# Patient Record
Sex: Female | Born: 1975 | Race: Black or African American | Hispanic: No | Marital: Single | Smoking: Former smoker
Health system: Southern US, Community
[De-identification: ages and names within clinical notes are randomized; demographics above are authoritative.]

## PROBLEM LIST (undated history)

## (undated) DIAGNOSIS — C801 Malignant (primary) neoplasm, unspecified: Secondary | ICD-10-CM

## (undated) DIAGNOSIS — E079 Disorder of thyroid, unspecified: Secondary | ICD-10-CM

## (undated) DIAGNOSIS — I10 Essential (primary) hypertension: Secondary | ICD-10-CM

## (undated) DIAGNOSIS — Q600 Renal agenesis, unilateral: Secondary | ICD-10-CM

---

## 1998-03-03 ENCOUNTER — Emergency Department (HOSPITAL_COMMUNITY): Admission: EM | Admit: 1998-03-03 | Discharge: 1998-03-03 | Payer: Self-pay | Admitting: Emergency Medicine

## 1998-03-30 ENCOUNTER — Emergency Department (HOSPITAL_COMMUNITY): Admission: EM | Admit: 1998-03-30 | Discharge: 1998-03-30 | Payer: Self-pay | Admitting: Emergency Medicine

## 1999-10-26 ENCOUNTER — Encounter: Payer: Self-pay | Admitting: Emergency Medicine

## 1999-10-26 ENCOUNTER — Emergency Department (HOSPITAL_COMMUNITY): Admission: EM | Admit: 1999-10-26 | Discharge: 1999-10-26 | Payer: Self-pay | Admitting: Emergency Medicine

## 2000-02-08 ENCOUNTER — Encounter: Admission: RE | Admit: 2000-02-08 | Discharge: 2000-02-08 | Payer: Self-pay | Admitting: Internal Medicine

## 2000-02-08 ENCOUNTER — Encounter: Payer: Self-pay | Admitting: Internal Medicine

## 2000-02-22 ENCOUNTER — Encounter: Payer: Self-pay | Admitting: Urology

## 2000-02-22 ENCOUNTER — Encounter: Admission: RE | Admit: 2000-02-22 | Discharge: 2000-02-22 | Payer: Self-pay | Admitting: Urology

## 2000-09-08 ENCOUNTER — Other Ambulatory Visit: Admission: RE | Admit: 2000-09-08 | Discharge: 2000-09-08 | Payer: Self-pay | Admitting: Gynecology

## 2000-09-11 ENCOUNTER — Emergency Department (HOSPITAL_COMMUNITY): Admission: EM | Admit: 2000-09-11 | Discharge: 2000-09-11 | Payer: Self-pay

## 2000-09-11 ENCOUNTER — Encounter: Payer: Self-pay | Admitting: Emergency Medicine

## 2000-10-06 ENCOUNTER — Encounter: Payer: Self-pay | Admitting: Urology

## 2000-10-06 ENCOUNTER — Encounter: Admission: RE | Admit: 2000-10-06 | Discharge: 2000-10-06 | Payer: Self-pay | Admitting: Urology

## 2000-11-07 ENCOUNTER — Ambulatory Visit (HOSPITAL_COMMUNITY): Admission: RE | Admit: 2000-11-07 | Discharge: 2000-11-07 | Payer: Self-pay | Admitting: Urology

## 2000-11-07 ENCOUNTER — Encounter: Payer: Self-pay | Admitting: Urology

## 2001-05-17 ENCOUNTER — Other Ambulatory Visit: Admission: RE | Admit: 2001-05-17 | Discharge: 2001-05-17 | Payer: Self-pay | Admitting: Gynecology

## 2002-03-14 ENCOUNTER — Encounter: Admission: RE | Admit: 2002-03-14 | Discharge: 2002-03-14 | Payer: Self-pay | Admitting: Urology

## 2002-03-14 ENCOUNTER — Encounter: Payer: Self-pay | Admitting: Urology

## 2002-05-23 ENCOUNTER — Other Ambulatory Visit: Admission: RE | Admit: 2002-05-23 | Discharge: 2002-05-23 | Payer: Self-pay | Admitting: Gynecology

## 2002-11-26 ENCOUNTER — Other Ambulatory Visit: Admission: RE | Admit: 2002-11-26 | Discharge: 2002-11-26 | Payer: Self-pay | Admitting: *Deleted

## 2003-03-01 ENCOUNTER — Ambulatory Visit (HOSPITAL_COMMUNITY): Admission: RE | Admit: 2003-03-01 | Discharge: 2003-03-01 | Payer: Self-pay | Admitting: *Deleted

## 2003-04-02 ENCOUNTER — Encounter: Admission: RE | Admit: 2003-04-02 | Discharge: 2003-04-02 | Payer: Self-pay | Admitting: Obstetrics and Gynecology

## 2003-04-06 ENCOUNTER — Inpatient Hospital Stay (HOSPITAL_COMMUNITY): Admission: AD | Admit: 2003-04-06 | Discharge: 2003-04-06 | Payer: Self-pay | Admitting: Obstetrics and Gynecology

## 2003-05-23 ENCOUNTER — Inpatient Hospital Stay (HOSPITAL_COMMUNITY): Admission: RE | Admit: 2003-05-23 | Discharge: 2003-05-26 | Payer: Self-pay | Admitting: Obstetrics and Gynecology

## 2003-07-03 ENCOUNTER — Other Ambulatory Visit: Admission: RE | Admit: 2003-07-03 | Discharge: 2003-07-03 | Payer: Self-pay | Admitting: *Deleted

## 2003-10-11 ENCOUNTER — Encounter: Admission: RE | Admit: 2003-10-11 | Discharge: 2003-10-11 | Payer: Self-pay | Admitting: Internal Medicine

## 2004-05-24 ENCOUNTER — Emergency Department (HOSPITAL_COMMUNITY): Admission: EM | Admit: 2004-05-24 | Discharge: 2004-05-24 | Payer: Self-pay | Admitting: Family Medicine

## 2005-11-26 ENCOUNTER — Other Ambulatory Visit: Admission: RE | Admit: 2005-11-26 | Discharge: 2005-11-26 | Payer: Self-pay | Admitting: Gynecology

## 2006-01-12 ENCOUNTER — Encounter: Admission: RE | Admit: 2006-01-12 | Discharge: 2006-01-12 | Payer: Self-pay | Admitting: Internal Medicine

## 2006-01-20 ENCOUNTER — Encounter: Admission: RE | Admit: 2006-01-20 | Discharge: 2006-01-20 | Payer: Self-pay | Admitting: Internal Medicine

## 2006-07-05 ENCOUNTER — Emergency Department (HOSPITAL_COMMUNITY): Admission: EM | Admit: 2006-07-05 | Discharge: 2006-07-06 | Payer: Self-pay | Admitting: Emergency Medicine

## 2006-10-28 ENCOUNTER — Ambulatory Visit (HOSPITAL_COMMUNITY): Admission: RE | Admit: 2006-10-28 | Discharge: 2006-10-28 | Payer: Self-pay | Admitting: Internal Medicine

## 2006-11-16 ENCOUNTER — Other Ambulatory Visit: Admission: RE | Admit: 2006-11-16 | Discharge: 2006-11-16 | Payer: Self-pay | Admitting: Gynecology

## 2006-11-29 ENCOUNTER — Ambulatory Visit: Admission: RE | Admit: 2006-11-29 | Discharge: 2006-11-29 | Payer: Self-pay | Admitting: Gynecologic Oncology

## 2006-12-28 ENCOUNTER — Encounter (HOSPITAL_COMMUNITY): Admission: RE | Admit: 2006-12-28 | Discharge: 2006-12-29 | Payer: Self-pay | Admitting: Internal Medicine

## 2007-01-06 ENCOUNTER — Ambulatory Visit (HOSPITAL_COMMUNITY): Admission: RE | Admit: 2007-01-06 | Discharge: 2007-01-06 | Payer: Self-pay | Admitting: Internal Medicine

## 2007-01-23 ENCOUNTER — Ambulatory Visit (HOSPITAL_COMMUNITY): Admission: RE | Admit: 2007-01-23 | Discharge: 2007-01-23 | Payer: Self-pay | Admitting: Gynecologic Oncology

## 2007-01-24 ENCOUNTER — Ambulatory Visit: Admission: RE | Admit: 2007-01-24 | Discharge: 2007-01-24 | Payer: Self-pay | Admitting: Gynecologic Oncology

## 2007-02-10 ENCOUNTER — Emergency Department (HOSPITAL_COMMUNITY): Admission: EM | Admit: 2007-02-10 | Discharge: 2007-02-10 | Payer: Self-pay | Admitting: Family Medicine

## 2007-03-17 ENCOUNTER — Emergency Department (HOSPITAL_COMMUNITY): Admission: EM | Admit: 2007-03-17 | Discharge: 2007-03-17 | Payer: Self-pay | Admitting: Family Medicine

## 2007-10-19 ENCOUNTER — Emergency Department (HOSPITAL_COMMUNITY): Admission: EM | Admit: 2007-10-19 | Discharge: 2007-10-19 | Payer: Self-pay | Admitting: Family Medicine

## 2010-08-19 ENCOUNTER — Emergency Department (HOSPITAL_COMMUNITY)
Admission: EM | Admit: 2010-08-19 | Discharge: 2010-08-20 | Payer: Self-pay | Source: Home / Self Care | Admitting: Emergency Medicine

## 2010-08-24 LAB — URINALYSIS, ROUTINE W REFLEX MICROSCOPIC
Bilirubin Urine: NEGATIVE
Ketones, ur: NEGATIVE mg/dL
Nitrite: POSITIVE — AB
Protein, ur: NEGATIVE mg/dL
Specific Gravity, Urine: 1.015 (ref 1.005–1.030)
Urine Glucose, Fasting: NEGATIVE mg/dL
Urobilinogen, UA: 1 mg/dL (ref 0.0–1.0)
pH: 6 (ref 5.0–8.0)

## 2010-08-24 LAB — POCT I-STAT, CHEM 8
BUN: 6 mg/dL (ref 6–23)
Calcium, Ion: 1.08 mmol/L — ABNORMAL LOW (ref 1.12–1.32)
Chloride: 103 mEq/L (ref 96–112)
Creatinine, Ser: 1.1 mg/dL (ref 0.4–1.2)
Glucose, Bld: 167 mg/dL — ABNORMAL HIGH (ref 70–99)
HCT: 47 % — ABNORMAL HIGH (ref 36.0–46.0)
Hemoglobin: 16 g/dL — ABNORMAL HIGH (ref 12.0–15.0)
Potassium: 3.6 mEq/L (ref 3.5–5.1)
Sodium: 139 mEq/L (ref 135–145)
TCO2: 27 mmol/L (ref 0–100)

## 2010-08-24 LAB — CBC
HCT: 44.6 % (ref 36.0–46.0)
Hemoglobin: 14.9 g/dL (ref 12.0–15.0)
MCH: 26.6 pg (ref 26.0–34.0)
MCHC: 33.4 g/dL (ref 30.0–36.0)
MCV: 79.6 fL (ref 78.0–100.0)
Platelets: 206 10*3/uL (ref 150–400)
RBC: 5.6 MIL/uL — ABNORMAL HIGH (ref 3.87–5.11)
RDW: 14.8 % (ref 11.5–15.5)
WBC: 9.6 10*3/uL (ref 4.0–10.5)

## 2010-08-24 LAB — URINE MICROSCOPIC-ADD ON

## 2010-08-24 LAB — DIFFERENTIAL
Basophils Absolute: 0 10*3/uL (ref 0.0–0.1)
Basophils Relative: 0 % (ref 0–1)
Eosinophils Absolute: 0.4 10*3/uL (ref 0.0–0.7)
Eosinophils Relative: 4 % (ref 0–5)
Lymphocytes Relative: 12 % (ref 12–46)
Lymphs Abs: 1.1 10*3/uL (ref 0.7–4.0)
Monocytes Absolute: 0.5 10*3/uL (ref 0.1–1.0)
Monocytes Relative: 5 % (ref 3–12)
Neutro Abs: 7.5 10*3/uL (ref 1.7–7.7)
Neutrophils Relative %: 79 % — ABNORMAL HIGH (ref 43–77)

## 2010-08-24 LAB — PREGNANCY, URINE: Preg Test, Ur: NEGATIVE

## 2010-08-29 ENCOUNTER — Encounter: Payer: Self-pay | Admitting: Internal Medicine

## 2010-08-30 ENCOUNTER — Encounter: Payer: Self-pay | Admitting: Internal Medicine

## 2010-08-30 ENCOUNTER — Encounter: Payer: Self-pay | Admitting: Gynecologic Oncology

## 2010-12-22 NOTE — Consult Note (Signed)
Patricia Mcclain, Patricia Mcclain                ACCOUNT NO.:  [id number]   MEDICAL RECORD NO.:  [id number]          PATIENT TYPE:  OUT   LOCATION:  GYN                          FACILITY:  [facility]   PHYSICIAN:  Paola A. Duard Brady, MD    DATE OF BIRTH:  04/26/1976   DATE OF CONSULTATION:  DATE OF DISCHARGE:                                 CONSULTATION   Patricia Mcclain is a 35 year old who I initially saw November 29, 2006.  At that  time she was referred to me by Dr. Nicholas Lose for a complex 5.1 x 3 cm  ovarian mass with a hydrosalpinx that quite possibly measured 5.5 x 2.4  cm.  The left ovary was not seen and your impression of the right ovary  was that this was a tubo-ovarian complex mass versus an endometrioma.  After a lengthy discussion she opted for interval ultrasound.  The mass  had gotten somewhat bigger compared to prior ultrasounds and CT reports,  but not significantly.  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 Kathrynn Running.  She comes in today for follow  up.  She is overall doing quite well.  She states that she is not  feeling any different with regards to her shaking, her night sweats or  her mood swings.  She denies any change in her bowel or bladder habits.  Her last cycle was June 1 and it is too early to tell whether or not her  cycles are going to get regular from her irradiated iodine.  She has  follow up with Dr. Chilton Si pending.   PAST MEDICAL HISTORY:  Graves disease.   ALLERGIES:  CODEINE.   PHYSICAL EXAMINATION:  Weight 246 pounds.  Abdomen:  Soft, nontender,  nondistended, no palpable mass or hepatosplenomegaly.  Pelvic bimanual  examination:  Cervix is palpably normal.  The corpus is normal size,  shape __________. There are no adnexal masses palpable.   ASSESSMENT:  A 35 year old with a complex right ovarian mass that was  palpable on my exam in April and was also noted to be  approximately 5 cm  on ultrasound.  She had an ultrasound on June 16 that reveals the uterus  to be 7.1 x 3.6 x 4.7 cm with a normal homogeneous endometrium.  The  right ovary contained two cysts, one measuring 2.8 x 2.6 x 2.9 cm and  the other measured 2 x 1.6 cm with a small amount of free fluid around  the right ovary.  These cysts were noted on prior CT scan and they  appear smaller than on her prior scan.  These results were discussed  with the patient and her mother.  In light of her exam being improved,  she is asymptomatic and the masses are smaller.  I would recommend  continued close followup.  The patient was very pleased with this.  We  will  schedule for a repeat ultrasound in 8 weeks and contact her with the  results.  If the  cysts are stable or smaller in size, we will then defer  follow up to Dr. Nicholas Lose as she will be returned to his clinic for routine  surveillance.  She will follow up with her other physicians regarding  her other medical issues.      Paola A. Duard Brady, MD  Electronically Signed     PAG/MEDQ  D:  01/24/2007  T:  01/25/2007  Job:  [id number]   cc:   Telford Nab, R.N.  [address]  [place], [place] [address]   Gretta Cool, M.D.  Fax: [phone]   Valetta Fuller, M.D.  Fax: [phone]   Erskine Speed, M.D.  Fax: [phone]

## 2010-12-25 NOTE — Op Note (Signed)
NAME:  Patricia Mcclain, Patricia Mcclain                          ACCOUNT NO.:  [id number]   MEDICAL RECORD NO.:  [id number]                   PATIENT TYPE:  INP   LOCATION:  9199                                 FACILITY:  [facility]   PHYSICIAN:  Juluis Mire, M.D.                DATE OF BIRTH:  10-12-1975   DATE OF PROCEDURE:  05/23/2003  DATE OF DISCHARGE:                                 OPERATIVE REPORT   PREOPERATIVE DIAGNOSIS:  Intrauterine pregnancy at 38 weeks with spontaneous  rupture of membranes and breech presentation.   POSTOPERATIVE DIAGNOSES:  1. Intrauterine pregnancy at 38 weeks with spontaneous rupture of membranes     and breech presentation.  2. Extensive abdominal and pelvic adhesions.   PROCEDURE:  Primary low transverse cesarean section with lysis of adhesions.   SURGEON:  Juluis Mire, M.D.   ANESTHESIA:  Spinal.   ESTIMATED BLOOD LOSS:  800 mL.   PACKS AND DRAINS:  None.   INTRAOPERATIVE BLOOD PLACED:  None.   COMPLICATIONS:  None.   INDICATIONS FOR PROCEDURE:  The patient is a 35 year old primigravida black  female who presents at 52 weeks with spontaneous rupture of membranes.  Ultrasound confirms breech presentation.  We are going to proceed with a  primary cesarean section.  The risks have been discussed including the risk  of infection, the risk of hemorrhage that could necessitate transfusion, the  risk of AIDS or hepatitis. The risk of injury to adjacent organs including  bladder, bowel or ureters that could require further exploratory surgery.  The risk of deep venous thrombosis and pulmonary embolus.  The patient  expressed understanding of indications and risks.   DESCRIPTION OF PROCEDURE:  The patient was taken to the OR and placed in the  supine position with left lateral tilt.  After satisfactory level of spinal  anesthesia was obtained, the abdomen was prepped out with Betadine and  draped as a sterile field.  A low transverse skin incision was made  with the  knife and carried through subcutaneous tissue.  The anterior rectus fascia  was entered sharply and incision in the fascia extended laterally.  Fascia  taken off the muscles superiorly and inferiorly.  Rectus muscles were  separated in the midline.  We really could not identify peritoneum as all  was densely adherent to the anterior part of the uterus.  Some loops of  small bowel were noted to be involved with flimsy adhesions on the upper  anterior uterine wall.  These were dissected superiorly.  We then did our  best to develop a low transverse bladder flap.  We did feel the bladder was  out of the way.  A low transverse uterine incision was begun with the knife,  extended laterally using manual traction.  The infant presented in the  breech presentation and was delivered in the usual manner and was delivered  in the  usual manner.  Amniotic fluid was clear.  The infant was a viable  female who weighed six pounds and 12 ounces.  Apgars were 8/9.  Umbilical  artery pH was 7.26.  Placenta was then delivered manually.  Uterus was  closed with a running locking suture of 0 chromic using a two-layer closure  technique.  We had good hemostasis and clear urine output.  We then tried to  identify the ovaries and tubes.  Again, the cul-de-sac was completely  obliterated.  Small intestines were adherent  to the anterior and uterine  fundus.  We were able to take down some of these flimsy adhesions from the  small-bowel and eventually dissect to both ovaries which appeared to be  normal.  I could not see the fimbria in either tube and again, the cul-de-  sac was completely obliterated.  There were no loops of small-bowel that had  to be a concern at this point in time.  There is no injury to it.  At this  point in time, we thoroughly irrigated the pelvis.  Hemostasis was  excellent.  Muscles reapproximated with suture of 3-0 Vicryl.  Fascia closed  with running suture of 0 PDS.  Skin was  closed with staples and Steri-  Strips.  Sponge, needle and instrument counts were reported as correct  by  the circulating nurse x2.  The patient tolerated the procedure well and was  returned to the recovery room in good condition.                                               Juluis Mire, M.D.    JSM/MEDQ  D:  05/23/2003  T:  05/23/2003  Job:  [id number]

## 2010-12-25 NOTE — Discharge Summary (Signed)
Patricia Mcclain, Patricia Mcclain                          ACCOUNT NO.:  [id number]   MEDICAL RECORD NO.:  [id number]                   PATIENT TYPE:  INP   LOCATION:  9130                                 FACILITY:  [facility]   PHYSICIAN:  Duke Salvia. Marcelle Overlie, M.D.            DATE OF BIRTH:  10/11/1975   DATE OF ADMISSION:  05/23/2003  DATE OF DISCHARGE:  05/26/2003                                 DISCHARGE SUMMARY   ADMITTING DIAGNOSES:  1. Intrauterine pregnancy at term.  2. Spontaneous rupture of membranes.  3. Breech presentation.   DISCHARGE DIAGNOSES:  1. Status post low transverse cesarean section secondary to breech     presentation.  2. Viable female infant.  3. Extensive abdominal and pelvic adhesions.   PROCEDURE:  Primary low transverse cesarean section with lysis of adhesions.   REASON FOR ADMISSION:  Please see written H&P.   HOSPITAL COURSE:  Patient was a 35 year old black female primigravida that  was admitted through [facility] at 38 weeks estimated  gestational age with spontaneous rupture of membranes.  Ultrasound confirmed  breech presentation.  Patient was discussed risks associated with a vaginal  delivery and it was opted to proceed with a cesarean delivery.  Patient was  taken to the operating room where spinal anesthesia was administered without  difficulty.  A low transverse incision was made with delivery of a viable  female infant weighing 6 pounds 12 ounces with Apgars of 8 at one minute, 9  at five minutes.  Umbilical cord pH was 7.26.  Patient tolerated procedure  well and was taken to recovery room in stable condition.  On postoperative  day #1 vital signs were stable, she remained afebrile, abdomen was soft with  good return of bowel function, abdominal dressing was noted to be clean,  dry, and intact, fundus was firm and nontender.  Labs revealed hemoglobin of  10.4, platelet count 204,000, WBC count of 8.6.  On postoperative day #2  vital  signs were stable, patient remained afebrile, abdominal dressing was  removed revealing incision that was clean, dry, and intact, patient was  ambulating well and tolerating a regular diet without complaints of nausea  and vomiting.  Postoperative day #3 vital signs were stable, patient  remained afebrile, incision was clean, dry, and intact, staples were removed  and patient was discharged home.   CONDITION ON DISCHARGE:  Good.   DIET:  Regular as tolerated.   ACTIVITY:  No heavy lifting or driving for two weeks.  No vaginal entry.   FOLLOW UP:  Patient to follow up in the office in 1 week for an incision  check.  She is to call for temperature greater than 100 degrees, persistent  nausea and vomiting, heavy vaginal bleeding, and/or redness or drainage from  incisional site.   DISCHARGE MEDICATIONS:  1. Tylox (#30) one p.o. q.4-6h. p.r.n.  2.     Motrin 600  mg every 6 hours.  3. Prenatal vitamins one p.o. daily.  4. Colace one p.o. daily p.r.n.     Julio Sicks, N.P.                        Richard M. Marcelle Overlie, M.D.    CC/MEDQ  D:  06/12/2003  T:  06/12/2003  Job:  [id number]

## 2010-12-25 NOTE — Op Note (Signed)
[facility]  Patient:    Patricia Mcclain, Patricia Mcclain                       MRN: [id number] Proc. Date: 11/07/00 Adm. Date:  [id number] Attending:  Thermon Leyland CC:         Erskine Speed, M.D.   Operative Report  PREOPERATIVE DIAGNOSES: 1. Recurrent cystitis. 2. Right hydronephrosis. 3. Solitary kidney.  POSTOPERATIVE DIAGNOSES: 1. Recurrent cystitis. 2. Right hydronephrosis. 3. Solitary kidney.  PROCEDURE PERFORMED:  Cystoscopy, right retrograde pyelogram, and right ureteroscopy.  SURGEON:  Barron Alvine, M.D.  ANESTHESIA:  General.  INDICATIONS:  The patient is a 35 year old African-American female.  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 She has been noted on several occasions to have marked dilations of the right pelvicaliceal system and ureter down to the level of the distal ureter. Despite the solitary kidney and marked dilation of the ureter and collecting system, she has had normal renal function.  An IVP showed pyelogram on the right side with evidence of severe dilation, but no evidence of obvious obstruction.  Previous histogram has shown no evidence of reflux.  We have felt that the patient probably does not have current obstruction of her collecting system or ureter.  We feel that she either has a nonobstructive, non-refluxing ______ ureter, or possibly had obstruction at some point in the distant past, but simply has severe residual dilation of the entire ureter and collecting system.  Because the patient has a solitary renal unit and because of the findings that have been noted, we felt that it was important to be absolutely certain that there is no current obstruction and also to see if with ureteroscopy and retrograde pyelogram we could determine the exact etiology for this marked dilation.  She presents now for that evaluation.  DESCRIPTION OF PROCEDURE AND FINDINGS:  The patient  was brought to the operating room where she had the satisfactory induction of general anesthesia. She was placed in the lithotomy position, and prepped and draped in the usual manner.  Cystoscopy revealed a relatively unremarkable bladder.  The right ureteral orifice appeared to be in a normal position and appeared to be of normal caliber.  On retrograde pyelogram, the patient had a fairly normal-appearing distal ureter for a distance of approximately 4-5 cm.  At that point, then there was a marked change in the caliber of the ureter, and the ureter became markedly dilated.  This went up to a markedly dilated renal pelvis, as well as numerous markedly dilated caliceal systems.  Careful evaluation of the distal ureter with fluoroscopy revealed no evidence of obvious stricture, and the majority of the distal ureter appeared to distend up fairly normally.  There also appeared to be reasonable drainage from this area.  I put the guidewire a short way up the ureter.  We then performed ureteroscopy of the distal ureter to really look at this transition zone between where the ureter was fairly normal in caliber, and where it became so markedly dilated.  The most distal aspect of the ureter appeared relatively normal with healthy mucosa.  There was no evidence of any stricture, and it was very easy to pass the ureteroscope out beneath for any dilation.  Four to five centimeters above the ureterovesical junction, there was a marked area of transition into severely dilated ureter.  There was a concentric ring of some scar, but this  was wide open, and certainly not causing any visual obstruction.  There was no evidence of any intraluminal process such as a stone.  There did not appear to be obvious visual obstruction of this area.  We therefore removed the ureteroscope and the guidewire.  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  At this  point, while there is marked dilation of her ureter and caliceal system, there was no evidence of obvious functional obstruction and she has normal renal function.  I do not think we would be able to improve on the drainage of the kidney by placement of a stent or any surgical procedure, and I suspect that what we are going to need to do is simply watch the patient very carefully to be sure that she does not have any decline in renal function.  She will need to be very careful with regard to the possibility of diabetes or hypertension in the future to really make certain that she does not develop any chronic medical conditions that could impart on her renal function.  She was brought to the recovery room in stable condition. DD:  11/07/00 TD:  11/08/00 Job: [id number]

## 2010-12-25 NOTE — Consult Note (Signed)
Patricia Mcclain, Patricia Mcclain                ACCOUNT NO.:  [id number]   MEDICAL RECORD NO.:  [id number]          PATIENT TYPE:  OUT   LOCATION:  GYN                          FACILITY:  [facility]   PHYSICIAN:  Paola A. Duard Brady, MD    DATE OF BIRTH:  [birth year]   DATE OF CONSULTATION:  11/29/2006  DATE OF DISCHARGE:                                 CONSULTATION   The patient is seen today in consultation at the request of Dr. Chilton Si  and Dr. Nicholas Lose.   Patricia Mcclain is a 35 year old gravida 1, para 1 who is currently on her  menstrual cycle.  She has been followed for quite some time for a right  adnexal mass.  In June 2007 she had a CT scan that showed a 5.2 x 3.9  ovarian mass.  Follow-up CT in March 2008 revealed a 6.6 cm ovarian mass  that was apparently midline and to the right of midline which is  displaced the uterus to the left.  It was felt that it could either be  multiple cystic lesions within the right ovary versus a multi septated  cystic mass within the ovary.  There was no significant lymphadenopathy  or ascites.  CA-125 was 5.8.  She also underwent an ultrasound at Dr.  Johnn Hai office on November 16, 2006.  It revealed the right ovary  to be 8.7  x 8.4 x 4.3 cm and a complex 5.1 x 3 cm cyst and a questionable  hydrosalpinx that measured 5.5 x 2.4 cm. The left ovary was not seen and  their impression was that this was a tubo-ovarian complex versus  endometrioma.  She comes in today for discussion regarding this.  When I  mentioned the possibility of surgical management, she appeared somewhat  surprised as this had not per her report been discussed with her.  She  also has other issues including hyperthyroidism.  She is followed by Dr.  Nila Nephew for this.  She has an appointment with radiation oncology  for triated iodine per her report on May 8.  Her most recent TSH was  less than 0.004 with a T3 uptake, T4 total and FTI being markedly  elevated.  She otherwise has no complaints from a GYN  standpoint.  She  has had some irregular cycles.  She has been off her birth control pills  and being off her pills she has always had irregular cycles.  She denies  any abdominal or pelvic pain.  She complains of significant mood swings,  night sweats and shaking which she attributes to her thyroid disease.  She denies any change in bowel or bladder habits.   PAST MEDICAL HISTORY:  Graves disease.   ALLERGIES:  CODEINE which causes hives and itching.   MEDICATIONS:  She cannot remember.  We have contacted Dr. Thomasene Lot office  which is closed until 2.   PAST SURGICAL HISTORY:  Lists kidney removed as a young child, c-section   SOCIAL HISTORY:  She smokes three cigarettes a day.  She drinks a bottle  of wine a day.  She denies use of  any drugs.  She is single.  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  She has a 65-year-old daughter.   FAMILY HISTORY:  Her father had prostate cancer.  He is 59.  Pap smear  in April 2008 was negative.   PHYSICAL EXAMINATION:  Weight 241 pounds, height 5 feet seven, blood  pressure 110/82, pulse 114, well-nourished, well-developed female in no  acute distress.  NECK:  Supple with no lymphadenopathy, no thyromegaly.  LUNGS:  Clear to auscultation bilaterally.  CARDIOVASCULAR:  Regular rate and rhythm.  ABDOMEN:  Morbidly obese, soft, nontender, nondistended.  No palpable  masses or hepatosplenomegaly.  Groins are negative for adenopathy.  EXTREMITIES:  No edema.  HEENT:  She has prominent exophthalmos.  PELVIC:  Bimanual examination of the cervix is palpably normal.  There  is an abdominal pelvic mass measuring approximately 8 cm.  I cannot  separate it from the right adnexa versus the uterus. They move together.  The mass is more prominent on the patient's right side.  There is no  adnexal masses on the left.   ASSESSMENT:  35 year old with a complex right ovarian mass.  My  suspicion  for malignant process is very low based on her age lack of  family history, the ultrasound and CT reports as well as her CA-125.  The mass has gotten somewhat bigger in the past almost 1 year but not  dramatically so.  Independent of this even if she needed surgical  evaluation and intervention we would need to correct her florid  hyperthyroidism prior to proceeding with surgical intervention.  The  risks and benefits of follow-up were discussed with the patient and she  at this point does not wish to proceed with surgery as she is  asymptomatic.  What we would like to proceed with is compliance from the  patient standpoint with follow-up with radiation oncology treatment for  her Graves' disease.  We will then follow up on ultrasound June 16 with  a follow-up visit with [place] on June 17.  This has been discussed with the  patient.  She was given the appointment and agrees to follow-up.      Paola A. Duard Brady, MD  Electronically Signed     PAG/MEDQ  D:  11/29/2006  T:  11/29/2006  Job:  [id number]   cc:   Erskine Speed, M.D.  Fax: [phone]   Valetta Fuller, M.D.  Fax: [phone]   Gretta Cool, M.D.  Fax: [phone]   Telford Nab, R.N.  [address]  [place], [place] [address]

## 2011-02-11 ENCOUNTER — Emergency Department (HOSPITAL_COMMUNITY): Payer: BC Managed Care – PPO

## 2011-02-11 ENCOUNTER — Emergency Department (HOSPITAL_COMMUNITY)
Admission: EM | Admit: 2011-02-11 | Discharge: 2011-02-11 | Disposition: A | Discharge status: Home / Self Care | Payer: BC Managed Care – PPO | Attending: Emergency Medicine | Admitting: Emergency Medicine

## 2011-02-11 DIAGNOSIS — R109 Unspecified abdominal pain: Secondary | ICD-10-CM | POA: Insufficient documentation

## 2011-02-11 DIAGNOSIS — N390 Urinary tract infection, site not specified: Secondary | ICD-10-CM | POA: Insufficient documentation

## 2011-02-11 DIAGNOSIS — N83209 Unspecified ovarian cyst, unspecified side: Secondary | ICD-10-CM | POA: Insufficient documentation

## 2011-02-11 LAB — COMPREHENSIVE METABOLIC PANEL
BUN: 9 mg/dL (ref 6–23)
Calcium: 8.6 mg/dL (ref 8.4–10.5)
GFR calc Af Amer: >60 mL/min (ref >60)
Glucose, Bld: 178 mg/dL — ABNORMAL HIGH (ref 70–99)
Sodium: 138 mEq/L (ref 135–145)
Total Protein: 7.2 g/dL (ref 6.0–8.3)

## 2011-02-11 LAB — CBC
MCH: 27 pg (ref 26.0–34.0)
MCV: 81.8 fL (ref 78.0–100.0)
Platelets: 217 10*3/uL (ref 150–400)
RBC: 4.78 MIL/uL (ref 3.87–5.11)
RDW: 14.4 % (ref 11.5–15.5)

## 2011-02-11 LAB — POCT PREGNANCY, URINE: Preg Test, Ur: NEGATIVE

## 2011-02-11 LAB — URINALYSIS, ROUTINE W REFLEX MICROSCOPIC
Nitrite: NEGATIVE
Specific Gravity, Urine: 1.011 (ref 1.005–1.030)
Urobilinogen, UA: 1 mg/dL (ref 0.0–1.0)

## 2011-02-11 LAB — URINE MICROSCOPIC-ADD ON

## 2011-02-11 MED ORDER — IOHEXOL 300 MG/ML  SOLN
100.0000 mL | Freq: Once | INTRAMUSCULAR | Status: AC | PRN
  Administered 2011-02-11: 100 mL via INTRAVENOUS

## 2011-02-13 LAB — URINE CULTURE: Colony Count: >=100000

## 2011-03-18 ENCOUNTER — Emergency Department (HOSPITAL_COMMUNITY)
Admission: EM | Admit: 2011-03-18 | Discharge: 2011-03-19 | Disposition: A | Discharge status: Home / Self Care | Payer: Self-pay | Attending: Emergency Medicine | Admitting: Emergency Medicine

## 2011-03-18 DIAGNOSIS — N390 Urinary tract infection, site not specified: Secondary | ICD-10-CM | POA: Insufficient documentation

## 2011-03-18 DIAGNOSIS — E119 Type 2 diabetes mellitus without complications: Secondary | ICD-10-CM | POA: Insufficient documentation

## 2011-03-18 DIAGNOSIS — R300 Dysuria: Secondary | ICD-10-CM | POA: Insufficient documentation

## 2011-03-18 DIAGNOSIS — I10 Essential (primary) hypertension: Secondary | ICD-10-CM | POA: Insufficient documentation

## 2011-03-18 LAB — GLUCOSE, CAPILLARY: Glucose-Capillary: 130 mg/dL — ABNORMAL HIGH (ref 70–99)

## 2011-03-19 LAB — URINALYSIS, ROUTINE W REFLEX MICROSCOPIC
Bilirubin Urine: NEGATIVE
Nitrite: POSITIVE — AB
Protein, ur: NEGATIVE mg/dL
Specific Gravity, Urine: 1.013 (ref 1.005–1.030)
Urobilinogen, UA: 1 mg/dL (ref 0.0–1.0)

## 2011-03-19 LAB — URINE MICROSCOPIC-ADD ON

## 2011-03-20 LAB — URINE CULTURE
Colony Count: >=100000
Culture  Setup Time: 201208100348

## 2012-05-04 ENCOUNTER — Emergency Department (HOSPITAL_COMMUNITY): Payer: Self-pay

## 2012-05-04 ENCOUNTER — Encounter (HOSPITAL_COMMUNITY): Payer: Self-pay | Admitting: Emergency Medicine

## 2012-05-04 ENCOUNTER — Emergency Department (HOSPITAL_COMMUNITY)
Admission: EM | Admit: 2012-05-04 | Discharge: 2012-05-04 | Disposition: A | Discharge status: Home / Self Care | Payer: Self-pay | Attending: Emergency Medicine | Admitting: Emergency Medicine

## 2012-05-04 DIAGNOSIS — R739 Hyperglycemia, unspecified: Secondary | ICD-10-CM

## 2012-05-04 DIAGNOSIS — R0602 Shortness of breath: Secondary | ICD-10-CM | POA: Insufficient documentation

## 2012-05-04 DIAGNOSIS — R05 Cough: Secondary | ICD-10-CM | POA: Insufficient documentation

## 2012-05-04 DIAGNOSIS — F17200 Nicotine dependence, unspecified, uncomplicated: Secondary | ICD-10-CM | POA: Insufficient documentation

## 2012-05-04 DIAGNOSIS — R059 Cough, unspecified: Secondary | ICD-10-CM | POA: Insufficient documentation

## 2012-05-04 DIAGNOSIS — E1169 Type 2 diabetes mellitus with other specified complication: Secondary | ICD-10-CM | POA: Insufficient documentation

## 2012-05-04 DIAGNOSIS — E079 Disorder of thyroid, unspecified: Secondary | ICD-10-CM | POA: Insufficient documentation

## 2012-05-04 HISTORY — DX: Disorder of thyroid, unspecified: E07.9

## 2012-05-04 LAB — BASIC METABOLIC PANEL
Calcium: 8.8 mg/dL (ref 8.4–10.5)
Creatinine, Ser: 0.85 mg/dL (ref 0.50–1.10)
GFR calc non Af Amer: 86 mL/min — ABNORMAL LOW (ref >90)
Sodium: 131 mEq/L — ABNORMAL LOW (ref 135–145)

## 2012-05-04 LAB — CBC WITH DIFFERENTIAL/PLATELET
Basophils Absolute: 0 10*3/uL (ref 0.0–0.1)
Basophils Relative: 1 % (ref 0–1)
Eosinophils Absolute: 0.3 10*3/uL (ref 0.0–0.7)
Eosinophils Relative: 3 % (ref 0–5)
HCT: 45.9 % (ref 36.0–46.0)
MCH: 27.1 pg (ref 26.0–34.0)
MCHC: 34.6 g/dL (ref 30.0–36.0)
MCV: 78.2 fL (ref 78.0–100.0)
Monocytes Absolute: 0.4 10*3/uL (ref 0.1–1.0)
Platelets: 192 10*3/uL (ref 150–400)
RDW: 14 % (ref 11.5–15.5)
WBC: 8.3 10*3/uL (ref 4.0–10.5)

## 2012-05-04 LAB — TROPONIN I: Troponin I: <0.3 ng/mL (ref <0.30)

## 2012-05-04 MED ORDER — ALBUTEROL SULFATE HFA 108 (90 BASE) MCG/ACT IN AERS
2.0000 | INHALATION_SPRAY | RESPIRATORY_TRACT | Status: DC | PRN
  Administered 2012-05-04: 2 via RESPIRATORY_TRACT
  Filled 2012-05-04: qty 6.7

## 2012-05-04 MED ORDER — METFORMIN HCL 500 MG PO TABS: 1000.0000 mg | ORAL_TABLET | Freq: Two times a day (BID) | ORAL | Status: DC

## 2012-05-04 MED ORDER — ALBUTEROL SULFATE (5 MG/ML) 0.5% IN NEBU: 5.0000 mg | INHALATION_SOLUTION | Freq: Once | RESPIRATORY_TRACT | Status: DC

## 2012-05-04 NOTE — ED Notes (Signed)
Pt c/o productive cough with yellow sputum, headache, back pain, and right side pain x 1 month. Pt states last night, cough worsened.

## 2012-05-04 NOTE — ED Provider Notes (Signed)
History     CSN: [id number]  Arrival date & time 05/04/12  4098   First MD Initiated Contact with Patient 05/04/12 1007      Chief Complaint  Patient presents with  . Cough  . Headache     The history is provided by the patient.   the patient reports ongoing productive cough for approximately one month.  She's had productive yellow sputum.  She also reports generalized headache and back pain and some generalized side pain.  Last night she reports that her cough worsen.  She's had no unilateral leg swelling.  She has no history of DVT or pulmonary embolism.  The patient denies a history of asthma.  She's had no orthopnea or dyspnea on exertion.  She reports no recent long travel or surgery.  She does report a history of diabetes reports her blood sugars consistently been in the 300s.  She has not discussed this with her primary care physician if she is concerned that she may need insulin sent.  She has not called her primary care physician either about her increasing and persistent cough for the past month.  Her symptoms are mild to moderate in severity.  She continues to smoke cigarettes and reports there are lots of "stressors" in her life currently    Past Medical History  Diagnosis Date  . Diabetes mellitus   . Thyroid disease     Past Surgical History  Procedure Date  . Cesarean section 2004    History reviewed. No pertinent family history.  History  Substance Use Topics  . Smoking status: Current Every Day Smoker  . Smokeless tobacco: Not on file  . Alcohol Use: Yes    OB History    Grav Para Term Preterm Abortions TAB SAB Ect Mult Living                  Review of Systems  Respiratory: Positive for cough.   Neurological: Positive for headaches.  All other systems reviewed and are negative.    Allergies  Review of patient's allergies indicates no known allergies.  Home Medications   Current Outpatient Rx  Name Route Sig Dispense Refill  . ATENOLOL 50  MG PO TABS Oral Take 50 mg by mouth daily.    Marland Kitchen DIPHENHYDRAMINE HCL 25 MG PO TABS Oral Take 25 mg by mouth every 6 (six) hours as needed. For allergies.    Marland Kitchen LEVOTHYROXINE SODIUM 200 MCG PO TABS Oral Take 200 mcg by mouth daily.    . NYQUIL PO Oral Take 1 capsule by mouth at bedtime as needed. For cold symptoms.    Marland Kitchen METFORMIN HCL 500 MG PO TABS Oral Take 2 tablets (1,000 mg total) by mouth 2 (two) times daily with a meal. 120 tablet 0    BP 125/86  Pulse 91  Temp 98.7 F (37.1 C) (Oral)  Resp 20  Ht 5\' 6"  (1.676 m)  Wt 294 lb (133.358 kg)  BMI 47.45 kg/m2  SpO2 97%  LMP 05/03/2012  Physical Exam  Nursing note and vitals reviewed. Constitutional: She is oriented to person, place, and time. She appears well-developed and well-nourished. No distress.  HENT:  Head: Normocephalic and atraumatic.  Eyes: EOM are normal.  Neck: Normal range of motion.  Cardiovascular: Normal rate, regular rhythm and normal heart sounds.   Pulmonary/Chest: Effort normal and breath sounds normal.  Abdominal: Soft. She exhibits no distension. There is no tenderness.  Musculoskeletal: Normal range of motion.  Neurological: She is  alert and oriented to person, place, and time.  Skin: Skin is warm and dry.  Psychiatric: She has a normal mood and affect. Judgment normal.    ED Course  Procedures (including critical care time)  Labs Reviewed  CBC WITH DIFFERENTIAL - Abnormal; Notable for the following:    RBC 5.87 (*)     Hemoglobin 15.9 (*)     All other components within normal limits  BASIC METABOLIC PANEL - Abnormal; Notable for the following:    Sodium 131 (*)     Glucose, Bld 456 (*)     GFR calc non Af Amer 86 (*)     All other components within normal limits  TROPONIN I   Dg Chest 2 View  05/04/2012  *RADIOLOGY REPORT*  Clinical Data: Cough.  Chest pain.  Shortness of breath.  CHEST - 2 VIEW  Comparison:  08/19/2010  Findings:  The heart size and mediastinal contours are within normal  limits.  Both lungs are clear.  Mild to moderate thoracic dextroscoliosis again noted.  IMPRESSION: No active cardiopulmonary disease.  Scoliosis.   Original Report Authenticated By: Danae Orleans, M.D.     Date: 05/04/2012  Rate: 90  Rhythm: normal sinus rhythm  QRS Axis: normal  Intervals: normal  ST/T Wave abnormalities: normal  Conduction Disutrbances: none  Narrative Interpretation:   Old EKG Reviewed: No significant changes noted     1. Cough   2. Hyperglycemia       MDM  The patient's chest x-ray is clear.  I've increased her metformin from 500 twice a day to 1000 mg twice a day.  She's instructed to followup with her primary care physician regarding her poorly controlled diabetes and her hyperglycemia.  Home with albuterol inhaler to help with what appears to be bronchitis.  Discharge home in good condition.        Lyanne Co, MD 05/04/12 [phone]

## 2012-05-04 NOTE — ED Notes (Signed)
Patient transported to X-ray 

## 2012-05-04 NOTE — ED Notes (Signed)
Respiratory notified via phone 

## 2012-05-04 NOTE — ED Notes (Signed)
Patient states she has been since for one month.  Patient states she had had a productive cough with yellow/green sputum, nasal congestion/sneezing, and pleuritic pain while coughing.  Patient denies fevers but reports night sweats.

## 2012-08-29 ENCOUNTER — Other Ambulatory Visit: Payer: Self-pay | Admitting: Internal Medicine

## 2012-09-02 ENCOUNTER — Other Ambulatory Visit: Payer: BC Managed Care – PPO

## 2012-12-16 ENCOUNTER — Other Ambulatory Visit: Payer: BC Managed Care – PPO

## 2012-12-17 ENCOUNTER — Inpatient Hospital Stay: Admission: RE | Admit: 2012-12-17 | Payer: BC Managed Care – PPO | Source: Ambulatory Visit

## 2013-06-18 ENCOUNTER — Encounter (HOSPITAL_COMMUNITY): Payer: Self-pay | Admitting: Emergency Medicine

## 2013-06-18 ENCOUNTER — Emergency Department (HOSPITAL_COMMUNITY): Payer: Self-pay

## 2013-06-18 ENCOUNTER — Emergency Department (HOSPITAL_COMMUNITY)
Admission: EM | Admit: 2013-06-18 | Discharge: 2013-06-18 | Disposition: A | Discharge status: Home / Self Care | Payer: Self-pay | Attending: Emergency Medicine | Admitting: Emergency Medicine

## 2013-06-18 DIAGNOSIS — Z7982 Long term (current) use of aspirin: Secondary | ICD-10-CM | POA: Insufficient documentation

## 2013-06-18 DIAGNOSIS — Z87891 Personal history of nicotine dependence: Secondary | ICD-10-CM | POA: Insufficient documentation

## 2013-06-18 DIAGNOSIS — E119 Type 2 diabetes mellitus without complications: Secondary | ICD-10-CM | POA: Insufficient documentation

## 2013-06-18 DIAGNOSIS — I10 Essential (primary) hypertension: Secondary | ICD-10-CM | POA: Insufficient documentation

## 2013-06-18 DIAGNOSIS — R071 Chest pain on breathing: Secondary | ICD-10-CM | POA: Insufficient documentation

## 2013-06-18 DIAGNOSIS — J189 Pneumonia, unspecified organism: Secondary | ICD-10-CM | POA: Insufficient documentation

## 2013-06-18 DIAGNOSIS — R5381 Other malaise: Secondary | ICD-10-CM | POA: Insufficient documentation

## 2013-06-18 DIAGNOSIS — Z79899 Other long term (current) drug therapy: Secondary | ICD-10-CM | POA: Insufficient documentation

## 2013-06-18 DIAGNOSIS — J90 Pleural effusion, not elsewhere classified: Secondary | ICD-10-CM | POA: Insufficient documentation

## 2013-06-18 HISTORY — DX: Essential (primary) hypertension: I10

## 2013-06-18 LAB — CBC WITH DIFFERENTIAL/PLATELET
Basophils Absolute: 0.1 10*3/uL (ref 0.0–0.1)
Eosinophils Relative: 10 % — ABNORMAL HIGH (ref 0–5)
HCT: 40.2 % (ref 36.0–46.0)
Hemoglobin: 13.6 g/dL (ref 12.0–15.0)
Lymphocytes Relative: 20 % (ref 12–46)
Lymphs Abs: 1.5 10*3/uL (ref 0.7–4.0)
MCV: 76 fL — ABNORMAL LOW (ref 78.0–100.0)
Monocytes Absolute: 0.5 10*3/uL (ref 0.1–1.0)
Neutro Abs: 4.7 10*3/uL (ref 1.7–7.7)
RBC: 5.29 MIL/uL — ABNORMAL HIGH (ref 3.87–5.11)
RDW: 13.4 % (ref 11.5–15.5)
WBC: 7.4 10*3/uL (ref 4.0–10.5)

## 2013-06-18 LAB — BASIC METABOLIC PANEL
CO2: 19 mEq/L (ref 19–32)
Calcium: 9.9 mg/dL (ref 8.4–10.5)
GFR calc non Af Amer: >90 mL/min (ref >90)
Potassium: 3.5 mEq/L (ref 3.5–5.1)
Sodium: 137 mEq/L (ref 135–145)

## 2013-06-18 LAB — POCT I-STAT TROPONIN I

## 2013-06-18 MED ORDER — DEXTROSE 5 % IV SOLN
1.0000 g | Freq: Once | INTRAVENOUS | Status: AC
  Administered 2013-06-18: 1 g via INTRAVENOUS
  Filled 2013-06-18: qty 10

## 2013-06-18 MED ORDER — HYDROCODONE-ACETAMINOPHEN 5-325 MG PO TABS: 2.0000 | ORAL_TABLET | ORAL | Status: DC | PRN

## 2013-06-18 MED ORDER — ONDANSETRON HCL 4 MG/2ML IJ SOLN
4.0000 mg | Freq: Once | INTRAMUSCULAR | Status: AC
  Administered 2013-06-18: 4 mg via INTRAVENOUS
  Filled 2013-06-18: qty 2

## 2013-06-18 MED ORDER — MORPHINE SULFATE 4 MG/ML IJ SOLN: 4.0000 mg | INTRAMUSCULAR | Status: DC | PRN

## 2013-06-18 MED ORDER — LEVOFLOXACIN 500 MG PO TABS: 500.0000 mg | ORAL_TABLET | Freq: Every day | ORAL | Status: DC

## 2013-06-18 NOTE — ED Provider Notes (Signed)
CSN: [id number]     Arrival date & time 06/18/13  6295 History   First MD Initiated Contact with Patient 06/18/13 0756     Chief Complaint  Patient presents with  . Shortness of Breath  . Chest Pain    HPI  Patient presents with 24 hours of symptoms. History morning she awakened. She was having some intermittent sharp well localized pleuritic left anterior chest pain. It is under her left breast. It radiates to the left lateral chest. It persists. It is worsened. It is not presents constantly only with breathing. Worse with deep breaths. She does feel a little breathless or "like a need more air". She does review some of this to the pain. Also states that she has been fatigued for a few months. No weight loss. She does not feel generally ill with fever and body aches spontaneous cough or GI symptoms.  No congestion runny nose ear pain sore throat. No CHF symptoms with orthopnea or edema. She does not overweight. She stopped smoking 4 weeks ago after 19 years. She stopped because she had an episode of chest pain. Chin EKG at work and was told it was "okay". She is also diabetic and on antihypertensives both for about 4-5 years. We'll history family history as her mother. Her mother takes Coumadin for lower extremity DVTs. She does not know of anyone else in her family has had DVT or PE. Her mother's developed after a hospitalization.  Patient is not on exogenous estrogen. She is not pregnant. She's not had an active malignancy. She's had no prolonged immobilization.  Past Medical History  Diagnosis Date  . Diabetes mellitus   . Thyroid disease   . Hypertension    Past Surgical History  Procedure Laterality Date  . Cesarean section  2004   History reviewed. No pertinent family history. History  Substance Use Topics  . Smoking status: Current Every Day Smoker  . Smokeless tobacco: Not on file  . Alcohol Use: Yes   OB History   Grav Para Term Preterm Abortions TAB SAB Ect Mult Living              Review of Systems  Constitutional: Positive for fatigue. Negative for fever, chills, diaphoresis and appetite change.  HENT: Negative for mouth sores, sore throat and trouble swallowing.   Eyes: Negative for visual disturbance.  Respiratory: Positive for shortness of breath. Negative for cough, chest tightness and wheezing.   Cardiovascular: Positive for chest pain. Negative for palpitations and leg swelling.       Pleuretic.  Gastrointestinal: Negative for nausea, vomiting, abdominal pain, diarrhea and abdominal distention.  Endocrine: Negative for polydipsia, polyphagia and polyuria.  Genitourinary: Negative for dysuria, frequency and hematuria.  Musculoskeletal: Negative for gait problem.  Skin: Negative for color change, pallor and rash.  Neurological: Negative for dizziness, syncope, light-headedness and headaches.  Hematological: Does not bruise/bleed easily.  Psychiatric/Behavioral: Negative for behavioral problems and confusion.    Allergies  Review of patient's allergies indicates no known allergies.  Home Medications   Current Outpatient Rx  Name  Route  Sig  Dispense  Refill  . aspirin 81 MG tablet   Oral   Take 81 mg by mouth daily.         Marland Kitchen atenolol (TENORMIN) 50 MG tablet   Oral   Take 50 mg by mouth daily.         Marland Kitchen levothyroxine (SYNTHROID, LEVOTHROID) 300 MCG tablet   Oral   Take 300 mcg  by mouth daily before breakfast.         . losartan (COZAAR) 100 MG tablet   Oral   Take 100 mg by mouth daily.         . phentermine 37.5 MG capsule   Oral   Take 37.5 mg by mouth every morning.         . sitaGLIPtin (JANUVIA) 100 MG tablet   Oral   Take 100 mg by mouth daily.         Marland Kitchen sulfamethoxazole-trimethoprim (BACTRIM DS) 800-160 MG per tablet   Oral   Take 1 tablet by mouth 2 (two) times daily.         Marland Kitchen HYDROcodone-acetaminophen (NORCO/VICODIN) 5-325 MG per tablet   Oral   Take 2 tablets by mouth every 4 (four) hours as  needed.   10 tablet   0   . levofloxacin (LEVAQUIN) 500 MG tablet   Oral   Take 1 tablet (500 mg total) by mouth daily.   10 tablet   0    BP 119/93  Pulse 93  Temp(Src) 97.7 F (36.5 C) (Oral)  SpO2 97%  LMP 06/18/2013 Physical Exam  Constitutional: She is oriented to person, place, and time. She appears well-developed and well-nourished. No distress.  She's not seem dyspneic with conversation  HENT:  Head: Normocephalic.  Eyes: Conjunctivae are normal. Pupils are equal, round, and reactive to light. No scleral icterus.  Neck: Normal range of motion. Neck supple. No thyromegaly present.  No JVD  Cardiovascular: Normal rate and regular rhythm.  Exam reveals no gallop and no friction rub.   No murmur heard. Regular rate. Sinus rhythm on the monitor. No S3 or 4.  Pulmonary/Chest: Effort normal and breath sounds normal. No respiratory distress. She has no wheezes. She has no rales.  She has no crackles or rales.  Abdominal: Soft. Bowel sounds are normal. She exhibits no distension. There is no tenderness. There is no rebound.  Benign abdomen  Musculoskeletal: Normal range of motion.  Neurological: She is alert and oriented to person, place, and time.  Skin: Skin is warm and dry. No rash noted.  No peripheral edema. No cording or swelling. No changes circumference of the legs left versus right  Psychiatric: She has a normal mood and affect. Her behavior is normal.    ED Course  Procedures (including critical care time) Labs Review Labs Reviewed  CBC WITH DIFFERENTIAL - Abnormal; Notable for the following:    RBC 5.29 (*)    MCV 76.0 (*)    MCH 25.7 (*)    Eosinophils Relative 10 (*)    All other components within normal limits  BASIC METABOLIC PANEL - Abnormal; Notable for the following:    Glucose, Bld 267 (*)    All other components within normal limits  PRO B NATRIURETIC PEPTIDE  POCT I-STAT TROPONIN I   Imaging Review Dg Chest 2 View  06/18/2013   CLINICAL  DATA:  Shortness of breath, chest pain  EXAM: CHEST  2 VIEW  COMPARISON:  05/04/2012  FINDINGS: Small left effusion noted with lingula and left lower lobe streaky opacity compatible with atelectasis and or pneumonia. Right lung remains clear. Normal heart size and vascularity. Negative for pneumothorax. Trachea midline. Stable mild scoliosis.  IMPRESSION: Small left effusion with lingula and left lower lobe streaky atelectasis/ airspace disease concerning for pneumonia. Recommend radiographic followup to document resolution.   Electronically Signed   By: Ruel Favors M.D.   On: 06/18/2013  09:07    EKG Interpretation     Ventricular Rate:  91 PR Interval:  129 QRS Duration: 85 QT Interval:  376 QTC Calculation: 463 R Axis:   66 Text Interpretation:  Sinus rhythm LAE, consider biatrial enlargement            MDM   1. Community acquired pneumonia   2. Pleural effusion      EKG shows sinus rhythm. No acute or ischemic changes. Biphasic P wave in V1 and V2 only. No signs of LVH. No Q waves. Clinically does not show pneumonia. Clinically not DVTs not tachycardic or hypoxemic. May be simple pleurisy. Plan: Serial enzymes. Chest x-ray. Reevaluation.  Chest x-ray shows right lower pleural effusion at the costophrenic angle. Also shows silhouette in the left heart border consistent with lingular infiltrate.  She's not hypoxemic. She's not tachycardic. She is appropriate for outpatient treatment. This is her strong preference. Plan given IV Rocephin. She'll be discharged with Levaquin x10 days. I have encouraged her to followup with her primary care physician for repeat x-ray after completion of her antibiotics to ensure resolution of the infiltrate and effusion. She expressed understanding of this. This is given to her in written form with her discharge instructions.    Roney Marion, MD 06/18/13 1009

## 2013-06-18 NOTE — ED Notes (Addendum)
Patient complains left sided chest pain started yesterday morning causing shortness of breath and weakness. Pain radiates from the left breast to under the left arm. Denies dizziness, n/v. Anterior Headache.

## 2013-06-18 NOTE — ED Notes (Signed)
MD at bedside. 

## 2013-06-18 NOTE — ED Notes (Signed)
Patient transported to X-ray 

## 2013-08-06 ENCOUNTER — Emergency Department (HOSPITAL_COMMUNITY): Payer: 59

## 2013-08-06 ENCOUNTER — Encounter (HOSPITAL_COMMUNITY): Payer: Self-pay | Admitting: Emergency Medicine

## 2013-08-06 ENCOUNTER — Emergency Department (HOSPITAL_COMMUNITY)
Admission: EM | Admit: 2013-08-06 | Discharge: 2013-08-07 | Disposition: A | Discharge status: Home / Self Care | Payer: 59 | Attending: Emergency Medicine | Admitting: Emergency Medicine

## 2013-08-06 DIAGNOSIS — I10 Essential (primary) hypertension: Secondary | ICD-10-CM | POA: Insufficient documentation

## 2013-08-06 DIAGNOSIS — J189 Pneumonia, unspecified organism: Secondary | ICD-10-CM

## 2013-08-06 DIAGNOSIS — R61 Generalized hyperhidrosis: Secondary | ICD-10-CM | POA: Insufficient documentation

## 2013-08-06 DIAGNOSIS — E119 Type 2 diabetes mellitus without complications: Secondary | ICD-10-CM | POA: Insufficient documentation

## 2013-08-06 DIAGNOSIS — R51 Headache: Secondary | ICD-10-CM | POA: Insufficient documentation

## 2013-08-06 DIAGNOSIS — E039 Hypothyroidism, unspecified: Secondary | ICD-10-CM | POA: Insufficient documentation

## 2013-08-06 DIAGNOSIS — J159 Unspecified bacterial pneumonia: Secondary | ICD-10-CM | POA: Insufficient documentation

## 2013-08-06 DIAGNOSIS — Z79899 Other long term (current) drug therapy: Secondary | ICD-10-CM | POA: Insufficient documentation

## 2013-08-06 DIAGNOSIS — Z87891 Personal history of nicotine dependence: Secondary | ICD-10-CM | POA: Insufficient documentation

## 2013-08-06 DIAGNOSIS — Z7982 Long term (current) use of aspirin: Secondary | ICD-10-CM | POA: Insufficient documentation

## 2013-08-06 LAB — CBC
Hemoglobin: 13.5 g/dL (ref 12.0–15.0)
Platelets: 202 10*3/uL (ref 150–400)
RBC: 5.19 MIL/uL — ABNORMAL HIGH (ref 3.87–5.11)
WBC: 7.1 10*3/uL (ref 4.0–10.5)

## 2013-08-06 LAB — PRO B NATRIURETIC PEPTIDE: Pro B Natriuretic peptide (BNP): 33.9 pg/mL (ref 0–125)

## 2013-08-06 LAB — BASIC METABOLIC PANEL
CO2: 27 mEq/L (ref 19–32)
Chloride: 98 mEq/L (ref 96–112)
Glucose, Bld: 158 mg/dL — ABNORMAL HIGH (ref 70–99)
Sodium: 136 mEq/L (ref 135–145)

## 2013-08-06 LAB — POCT I-STAT TROPONIN I: Troponin i, poc: 0 ng/mL (ref 0.00–0.08)

## 2013-08-06 MED ORDER — IOHEXOL 350 MG/ML SOLN
100.0000 mL | Freq: Once | INTRAVENOUS | Status: AC | PRN
  Administered 2013-08-06: 100 mL via INTRAVENOUS

## 2013-08-06 NOTE — ED Notes (Signed)
Pt c/o SOB, central/L side chest tightness, and mid/low back pain x 1 day.  Pain score 9/10.  Sts difficulty taking deep breaths.  Pt previously seen at [facility] for same complaint and told that she had PNA.  NAD noted.  Pt speaking in full sentences.

## 2013-08-06 NOTE — ED Notes (Signed)
Pt ambulated from room 24 towards room 4 while on pulse oximeter, SPo2 was stable in 96 to 100 on RA. She has an episode of becoming light headed and oxygen level dropped to 78 and increased to 82, 86, 90 to 94 while standing.  Upon arrival to room 24, pt oxygen level was 96% RA but felt light headed.

## 2013-08-06 NOTE — ED Provider Notes (Signed)
CSN: [id number]     Arrival date & time 08/06/13  1712 History   First MD Initiated Contact with Patient 08/06/13 2011     Chief Complaint  Patient presents with  . Shortness of Breath  . Chest Pain   (Consider location/radiation/quality/duration/timing/severity/associated sxs/prior Treatment) Patient is a 37 y.o. female presenting with shortness of breath and chest pain.  Shortness of Breath Associated symptoms: chest pain, diaphoresis and headaches   Associated symptoms: no abdominal pain, no cough and no rash   Chest Pain Associated symptoms: diaphoresis, headache and shortness of breath   Associated symptoms: no abdominal pain, no cough, no dizziness and no palpitations    37 yo female presents with chest pain and DOE that started yesterday evening. Patient describes pain as "achey" and pleuritic in nature wrapping under and around her Left breast. Pain does not radiate to jaw, shoulder, or back. Pain and DOE worsened with walking. Patient admits to some orthopnea stating she has to prop her head up at night over the past few days. Admits to wheezing when she walks. Reports a 5 pound weight gain over night, the day after christmas. Denies any leg swelling. Admits to night sweats. Denies fever/chills. Denies N/V.   PMH significant for Former smoker, Diabetes Mellitus, Hypothyroid, and HTN.  Past Medical History  Diagnosis Date  . Diabetes mellitus   . Thyroid disease   . Hypertension    Past Surgical History  Procedure Laterality Date  . Cesarean section  2004   History reviewed. No pertinent family history. History  Substance Use Topics  . Smoking status: Former Smoker    Quit date: 06/06/2013  . Smokeless tobacco: Never Used  . Alcohol Use: Yes   OB History   Grav Para Term Preterm Abortions TAB SAB Ect Mult Living                 Review of Systems  Constitutional: Positive for diaphoresis.  HENT: Positive for sinus pressure. Negative for congestion.   Respiratory:  Positive for shortness of breath. Negative for cough.   Cardiovascular: Positive for chest pain. Negative for palpitations.  Gastrointestinal: Negative for abdominal pain, diarrhea and constipation.  Musculoskeletal: Negative for myalgias.  Skin: Negative for rash.  Neurological: Positive for headaches. Negative for dizziness.    Allergies  Review of patient's allergies indicates no known allergies.  Home Medications   Current Outpatient Rx  Name  Route  Sig  Dispense  Refill  . aspirin 81 MG tablet   Oral   Take 81 mg by mouth daily.         Marland Kitchen atenolol (TENORMIN) 50 MG tablet   Oral   Take 50 mg by mouth daily.         Marland Kitchen levothyroxine (SYNTHROID, LEVOTHROID) 300 MCG tablet   Oral   Take 300 mcg by mouth daily before breakfast.         . losartan (COZAAR) 100 MG tablet   Oral   Take 100 mg by mouth daily.         . phentermine 37.5 MG capsule   Oral   Take 37.5 mg by mouth every morning.         . sitaGLIPtin (JANUVIA) 100 MG tablet   Oral   Take 100 mg by mouth daily.         . cefUROXime (CEFTIN) 500 MG tablet   Oral   Take 1 tablet (500 mg total) by mouth 2 (two) times daily with a  meal.   10 tablet   0   . doxycycline (VIBRAMYCIN) 100 MG capsule   Oral   Take 1 capsule (100 mg total) by mouth 2 (two) times daily. One po bid x 5 days   10 capsule   0    BP 120/94  Pulse 92  Temp(Src) 97.6 F (36.4 C) (Oral)  Resp 16  SpO2 95%  LMP 08/06/2013 Physical Exam  Nursing note and vitals reviewed. Constitutional: She is oriented to person, place, and time. She appears well-developed and well-nourished. No distress.  HENT:  Head: Normocephalic and atraumatic.  Right Ear: Tympanic membrane and ear canal normal. Tympanic membrane is not injected and not erythematous.  Left Ear: Tympanic membrane and ear canal normal. Tympanic membrane is not injected and not erythematous.  Nose: No mucosal edema or rhinorrhea.  Mouth/Throat: Uvula is midline,  oropharynx is clear and moist and mucous membranes are normal. No oropharyngeal exudate, posterior oropharyngeal edema or posterior oropharyngeal erythema.  Eyes: Conjunctivae and EOM are normal. No scleral icterus.  Neck: Normal range of motion. Neck supple. No JVD present.  Cardiovascular: Normal rate, regular rhythm and normal heart sounds.  Exam reveals no gallop and no friction rub.   No murmur heard. Pulmonary/Chest: Effort normal. No accessory muscle usage. Not tachypneic. No respiratory distress. She has no wheezes. She has no rhonchi. She has rales in the left lower field.  Abdominal: Soft. Bowel sounds are normal.  Musculoskeletal: Normal range of motion. She exhibits no edema.  Lymphadenopathy:    She has no cervical adenopathy.  Neurological: She is alert and oriented to person, place, and time.  Skin: Skin is warm and dry. She is not diaphoretic.  Psychiatric: She has a normal mood and affect. Her behavior is normal.    ED Course  Procedures (including critical care time) Labs Review Labs Reviewed  CBC - Abnormal; Notable for the following:    RBC 5.19 (*)    MCV 76.9 (*)    All other components within normal limits  BASIC METABOLIC PANEL - Abnormal; Notable for the following:    Glucose, Bld 158 (*)    GFR calc non Af Amer 83 (*)    All other components within normal limits  PRO B NATRIURETIC PEPTIDE  POCT I-STAT TROPONIN I   Imaging Review Dg Chest 2 View  08/06/2013   CLINICAL DATA:  Chest pain, shortness of breath  EXAM: CHEST  2 VIEW  COMPARISON:  06/18/2013; 05/04/2012  FINDINGS: Grossly unchanged cardiac silhouette and mediastinal contours. Grossly unchanged small left-sided pleural effusion with associated left basilar heterogeneous/consolidative opacities. Mild pulmonary venous congestion without frank evidence of edema, unchanged. No new focal airspace opacities. No definite pneumothorax. Unchanged bones.  IMPRESSION: 1. Unchanged small left-sided effusion with  associated left basilar opacities, atelectasis versus infiltrate. A follow-up chest radiograph in 4 to 6 weeks after treatment is recommended to ensure resolution. 2. Pulmonary venous congestion without frank evidence of edema.   Electronically Signed   By: Simonne Come M.D.   On: 08/06/2013 19:31   Ct Angio Chest W/cm &/or Wo Cm  08/06/2013   CLINICAL DATA:  Increasing shortness of breath since Saturday. Chest pain.  EXAM: CT ANGIOGRAPHY CHEST WITH CONTRAST  TECHNIQUE: Multidetector CT imaging of the chest was performed using the standard protocol during bolus administration of intravenous contrast. Multiplanar CT image reconstructions including MIPs were obtained to evaluate the vascular anatomy.  CONTRAST:  OMNIPAQUE IOHEXOL 350 MG/ML SOLN  COMPARISON:  None.  FINDINGS: Technically limited study with somewhat limited contrast bolus. The lower lobe pulmonary arteries are moderately well opacified. There is limited opacification of the central and upper lobe pulmonary arteries. No definitive filling defect is demonstrated, providing no positive evidence of pulmonary embolus. Due to suboptimal contrast bolus, emboli could be obscured.  Normal heart size. Normal caliber thoracic aorta. No dissection. Lymph nodes in the axilla bilaterally as well as in the mediastinum are moderately prominent without apparent pathologic enlargement. These are likely to be reactive. Esophagus is decompressed. There is a small left pleural effusion with basilar atelectasis demonstrated. No focal consolidation in the lungs. Visualization of lung fields is limited due to respiratory motion artifact. Airways appear patent. No pneumothorax.  Visualized portions of the upper abdominal organs are grossly unremarkable. No destructive bone lesions are apparent.  Review of the MIP images confirms the above findings.  IMPRESSION: Suboptimal contrast bolus limits evaluation for pulmonary embolus but no gross evidence of embolus is  demonstrated. Left pleural effusion with basilar atelectasis.   Electronically Signed   By: Burman Nieves M.D.   On: 08/06/2013 22:44    EKG Interpretation   None       MDM   1. CAP (community acquired pneumonia)   2. DOE (dyspnea on exertion)    Patient CXR shows unchanged infiltrate since 1 month ago when she had same presentation of sxs and dx with CAP. Labs show no Leukocytosis, similar to previous workup. CT angio shows no PE but limited study due to suboptimal contrast bolus. Patient O2 sat drops with ambulation in hall. Patient also symptomatic with DOE on ambulation.   Advised admission to hospital. Patient refuses admission at this time because she states she needs her tooth brush, lotion, and her own space. Explained need for observation, though patient resistant to persuasion.   Plan to start tx for CAP. Patient verbally agrees she will fill and start antibiotics tonight, and plans to follow up with [facility] within 24-48 hours. Patient more satisfied with this plan. Patient discharged.   Meds given in ED:  Medications  iohexol (OMNIPAQUE) 350 MG/ML injection 100 mL (100 mLs Intravenous Contrast Given 08/06/13 2224)    Discharge Medication List as of 08/07/2013 12:43 AM    START taking these medications   Details  cefUROXime (CEFTIN) 500 MG tablet Take 1 tablet (500 mg total) by mouth 2 (two) times daily with a meal., Starting 08/07/2013, Until Discontinued, Print    doxycycline (VIBRAMYCIN) 100 MG capsule Take 1 capsule (100 mg total) by mouth 2 (two) times daily. One po bid x 5 days, Starting 08/07/2013, Until Discontinued, Print           Rudene Anda, [place] 08/07/13 2325

## 2013-08-07 MED ORDER — DOXYCYCLINE HYCLATE 100 MG PO CAPS: 100.0000 mg | ORAL_CAPSULE | Freq: Two times a day (BID) | ORAL | Status: DC

## 2013-08-07 MED ORDER — CEFUROXIME AXETIL 500 MG PO TABS: 500.0000 mg | ORAL_TABLET | Freq: Two times a day (BID) | ORAL | Status: DC

## 2013-08-09 NOTE — ED Provider Notes (Signed)
Medical screening examination/treatment/procedure(s) were conducted as a shared visit with non-physician practitioner(s) and myself.  I personally evaluated the patient during the encounter   .Face to face Exam:  General:  A&Ox3 HEENT:  Atraumatic Resp:  Normal effort Abd:  Nondistended Neuro:No focal deficits   Ekg reviewed with PA. I agree with interpretation.      Dot Lanes, MD 08/09/13 1047

## 2013-10-22 ENCOUNTER — Emergency Department (HOSPITAL_COMMUNITY): Payer: 59

## 2013-10-22 ENCOUNTER — Emergency Department (HOSPITAL_COMMUNITY)
Admission: EM | Admit: 2013-10-22 | Discharge: 2013-10-22 | Disposition: A | Discharge status: Home / Self Care | Payer: 59 | Attending: Emergency Medicine | Admitting: Emergency Medicine

## 2013-10-22 ENCOUNTER — Encounter (HOSPITAL_COMMUNITY): Payer: Self-pay | Admitting: Emergency Medicine

## 2013-10-22 DIAGNOSIS — Z79899 Other long term (current) drug therapy: Secondary | ICD-10-CM | POA: Insufficient documentation

## 2013-10-22 DIAGNOSIS — Z7982 Long term (current) use of aspirin: Secondary | ICD-10-CM | POA: Insufficient documentation

## 2013-10-22 DIAGNOSIS — Z87891 Personal history of nicotine dependence: Secondary | ICD-10-CM | POA: Insufficient documentation

## 2013-10-22 DIAGNOSIS — I10 Essential (primary) hypertension: Secondary | ICD-10-CM | POA: Insufficient documentation

## 2013-10-22 DIAGNOSIS — Z791 Long term (current) use of non-steroidal anti-inflammatories (NSAID): Secondary | ICD-10-CM | POA: Insufficient documentation

## 2013-10-22 DIAGNOSIS — E079 Disorder of thyroid, unspecified: Secondary | ICD-10-CM | POA: Insufficient documentation

## 2013-10-22 DIAGNOSIS — R0781 Pleurodynia: Secondary | ICD-10-CM

## 2013-10-22 DIAGNOSIS — E119 Type 2 diabetes mellitus without complications: Secondary | ICD-10-CM | POA: Insufficient documentation

## 2013-10-22 DIAGNOSIS — R0602 Shortness of breath: Secondary | ICD-10-CM | POA: Insufficient documentation

## 2013-10-22 DIAGNOSIS — R071 Chest pain on breathing: Secondary | ICD-10-CM | POA: Insufficient documentation

## 2013-10-22 LAB — BASIC METABOLIC PANEL
BUN: 13 mg/dL (ref 6–23)
CHLORIDE: 97 meq/L (ref 96–112)
CO2: 28 meq/L (ref 19–32)
Calcium: 9.3 mg/dL (ref 8.4–10.5)
Creatinine, Ser: 1.01 mg/dL (ref 0.50–1.10)
GFR calc Af Amer: 81 mL/min — ABNORMAL LOW (ref >90)
GFR calc non Af Amer: 70 mL/min — ABNORMAL LOW (ref >90)
Glucose, Bld: 187 mg/dL — ABNORMAL HIGH (ref 70–99)
Potassium: 4.3 mEq/L (ref 3.7–5.3)
SODIUM: 136 meq/L — AB (ref 137–147)

## 2013-10-22 LAB — CBC WITH DIFFERENTIAL/PLATELET
Basophils Absolute: 0 10*3/uL (ref 0.0–0.1)
Basophils Relative: 1 % (ref 0–1)
Eosinophils Absolute: 0.2 10*3/uL (ref 0.0–0.7)
Eosinophils Relative: 3 % (ref 0–5)
HEMATOCRIT: 41.4 % (ref 36.0–46.0)
Hemoglobin: 13.9 g/dL (ref 12.0–15.0)
LYMPHS PCT: 16 % (ref 12–46)
Lymphs Abs: 0.9 10*3/uL (ref 0.7–4.0)
MCH: 25.9 pg — ABNORMAL LOW (ref 26.0–34.0)
MCHC: 33.6 g/dL (ref 30.0–36.0)
MCV: 77.1 fL — ABNORMAL LOW (ref 78.0–100.0)
MONO ABS: 0.5 10*3/uL (ref 0.1–1.0)
Monocytes Relative: 9 % (ref 3–12)
NEUTROS ABS: 4 10*3/uL (ref 1.7–7.7)
Neutrophils Relative %: 72 % (ref 43–77)
PLATELETS: 175 10*3/uL (ref 150–400)
RBC: 5.37 MIL/uL — ABNORMAL HIGH (ref 3.87–5.11)
RDW: 14.6 % (ref 11.5–15.5)
WBC: 5.5 10*3/uL (ref 4.0–10.5)

## 2013-10-22 LAB — D-DIMER, QUANTITATIVE (NOT AT ARMC): D DIMER QUANT: 0.81 ug{FEU}/mL — AB (ref 0.00–0.48)

## 2013-10-22 LAB — TROPONIN I

## 2013-10-22 MED ORDER — KETOROLAC TROMETHAMINE 30 MG/ML IJ SOLN
30.0000 mg | Freq: Once | INTRAMUSCULAR | Status: AC
  Administered 2013-10-22: 30 mg via INTRAVENOUS
  Filled 2013-10-22: qty 1

## 2013-10-22 MED ORDER — SODIUM CHLORIDE 0.9 % IV BOLUS (SEPSIS)
1000.0000 mL | Freq: Once | INTRAVENOUS | Status: AC
  Administered 2013-10-22: 1000 mL via INTRAVENOUS

## 2013-10-22 MED ORDER — HYDROCODONE-ACETAMINOPHEN 5-325 MG PO TABS: 1.0000 | ORAL_TABLET | ORAL | Status: DC | PRN

## 2013-10-22 MED ORDER — IBUPROFEN 600 MG PO TABS: 600.0000 mg | ORAL_TABLET | Freq: Three times a day (TID) | ORAL | Status: DC | PRN

## 2013-10-22 MED ORDER — IOHEXOL 350 MG/ML SOLN
100.0000 mL | Freq: Once | INTRAVENOUS | Status: AC | PRN
  Administered 2013-10-22: 100 mL via INTRAVENOUS

## 2013-10-22 NOTE — ED Notes (Signed)
Pt c/o mid to left sided sharp chest pain and SOB since Saturday. Pt in NAD on cell phone.

## 2013-10-22 NOTE — ED Notes (Signed)
Patient transported to CT 

## 2013-10-22 NOTE — ED Provider Notes (Signed)
CSN: [id number]     Arrival date & time 10/22/13  0844 History   First MD Initiated Contact with Patient 10/22/13 [phone]     Chief Complaint  Patient presents with  . Chest Pain  . Shortness of Breath      HPI Patient presents emergency department with pleuritic left-sided chest pain over the past several days.  No prior history of DVT or pulmonary embolism.  She reports shortness of breath with exertion.  She denies orthopnea.  She denies injury or trauma to her left chest.  No history of heart disease.  No recent long travel or surgery.  No family history of VTE.  Symptoms are mild to moderate in severity.  Nothing worsens or improves her symptoms.  She does have a history of hypertension, diabetes, thyroid disease.  She's compliant with her medications.    Past Medical History  Diagnosis Date  . Diabetes mellitus   . Thyroid disease   . Hypertension    Past Surgical History  Procedure Laterality Date  . Cesarean section  2004   No family history on file. History  Substance Use Topics  . Smoking status: Former Smoker    Quit date: 06/06/2013  . Smokeless tobacco: Never Used  . Alcohol Use: Yes   OB History   Grav Para Term Preterm Abortions TAB SAB Ect Mult Living                 Review of Systems  All other systems reviewed and are negative.      Allergies  Review of patient's allergies indicates no known allergies.  Home Medications   Current Outpatient Rx  Name  Route  Sig  Dispense  Refill  . aspirin 81 MG tablet   Oral   Take 81 mg by mouth daily.         Marland Kitchen atenolol (TENORMIN) 50 MG tablet   Oral   Take 50 mg by mouth daily.         Marland Kitchen levothyroxine (SYNTHROID, LEVOTHROID) 200 MCG tablet   Oral   Take 200 mcg by mouth daily before breakfast. Take with 50mg  tablet for total dose of = 250mg          . levothyroxine (SYNTHROID, LEVOTHROID) 50 MCG tablet   Oral   Take 50 mcg by mouth daily before breakfast. Take with 200mg  for a total = 250mg           . losartan (COZAAR) 100 MG tablet   Oral   Take 100 mg by mouth daily.         . naproxen sodium (ANAPROX) 220 MG tablet   Oral   Take 440 mg by mouth 2 (two) times daily with a meal.         . phentermine 37.5 MG capsule   Oral   Take 37.5 mg by mouth every morning.         . sitaGLIPtin (JANUVIA) 100 MG tablet   Oral   Take 100 mg by mouth daily.          BP 114/83  Pulse 102  Temp(Src) 98 F (36.7 C) (Oral)  Resp 20  SpO2 97%  LMP 10/19/2013 Physical Exam  Nursing note and vitals reviewed. Constitutional: She is oriented to person, place, and time. She appears well-developed and well-nourished. No distress.  HENT:  Head: Normocephalic and atraumatic.  Eyes: EOM are normal.  Neck: Normal range of motion.  Cardiovascular: Normal rate, regular rhythm and normal heart sounds.  Pulmonary/Chest: Effort normal and breath sounds normal.  Abdominal: Soft. She exhibits no distension. There is no tenderness.  Musculoskeletal: Normal range of motion.  Neurological: She is alert and oriented to person, place, and time.  Skin: Skin is warm and dry.  Psychiatric: She has a normal mood and affect. Judgment normal.    ED Course  Procedures (including critical care time) Labs Review Labs Reviewed  CBC WITH DIFFERENTIAL - Abnormal; Notable for the following:    RBC 5.37 (*)    MCV 77.1 (*)    MCH 25.9 (*)    All other components within normal limits  BASIC METABOLIC PANEL - Abnormal; Notable for the following:    Sodium 136 (*)    Glucose, Bld 187 (*)    GFR calc non Af Amer 70 (*)    GFR calc Af Amer 81 (*)    All other components within normal limits  D-DIMER, QUANTITATIVE - Abnormal; Notable for the following:    D-Dimer, Quant 0.81 (*)    All other components within normal limits  TROPONIN I   Imaging Review Dg Chest 2 View  10/22/2013   CLINICAL DATA:  Left chest pain  EXAM: CHEST  2 VIEW  COMPARISON:  08/06/2013  FINDINGS: Cardiomediastinal  silhouette is stable. Right lung is clear. Mild thoracic dextroscoliosis. Small left pleural effusion with with trace left basilar atelectasis or infiltrate. Blunting of left costophrenic angle again noted. No pulmonary edema. Bony thorax is stable.  IMPRESSION: Small left pleural effusion with trace left basilar atelectasis or infiltrate. Blunting of the left costophrenic angle again noted. Right lung is clear.   Electronically Signed   By: Lahoma Crocker M.D.   On: 10/22/2013 09:39   Ct Angio Chest W/cm &/or Wo Cm  10/22/2013   CLINICAL DATA:  Chest pain, shortness of breath, cough, hemoptysis and elevated D-dimer.  EXAM: CT ANGIOGRAPHY CHEST WITH CONTRAST  TECHNIQUE: Multidetector CT imaging of the chest was performed using the standard protocol during bolus administration of intravenous contrast. Multiplanar CT image reconstructions and MIPs were obtained to evaluate the vascular anatomy.  CONTRAST:  124mL OMNIPAQUE IOHEXOL 350 MG/ML SOLN  COMPARISON:  DG CHEST 2 VIEW dated 10/22/2013; CT ANGIO CHEST W/CM &/OR WO/CM dated 08/06/2013  FINDINGS: The pulmonary arteries are well opacified. There is no evidence of pulmonary embolism. The thoracic aorta is also well opacified and shows normal patency without evidence of aneurysmal disease or dissection.  Stable prevascular lymph nodes are identified adjacent to the thoracic aortic arch. There is stable parenchymal scarring in the inferior lingula, left lower lobe and in the left upper lobe with some associated pleural thickening and a tiny left pleural effusion. Pleural fluid volume has decreased since the prior CT. Pleural thickening is more prominent compared to the prior CT there may be a component of active pleuritis/ pleuritic inflammation.  No pulmonary edema, focal airspace consolidation, nodule or pneumothorax is identified.  The heart size is normal. No pericardial fluid is identified. No bony abnormalities are seen.  Review of the MIP images confirms the above  findings.  IMPRESSION: 1. No evidence of pulmonary embolism. 2. Stable parenchymal scarring in the left lung with some associated pleural thickening and trace pleural fluid. The total amount of left pleural fluid has decreased since the prior CT. More prominent pleural thickening since the prior CT may be consistent with pleuritis/pleuritic inflammation.   Electronically Signed   By: Aletta Edouard M.D.   On: 10/22/2013 11:32  I personally  reviewed the imaging tests through PACS system I reviewed available ER/hospitalization records through the EMR     EKG Interpretation   Date/Time:  Monday October 22 2013 09:01:41 EDT Ventricular Rate:  102 PR Interval:  145 QRS Duration: 76 QT Interval:  351 QTC Calculation: 457 R Axis:   46 Text Interpretation:  Sinus tachycardia Low voltage, precordial leads  Baseline wander in lead(s) V1 No significant change was found Confirmed by  Tomio Kirk  MD, Lainey Nelson ([id number]) on 10/22/2013 9:07:54 AM      MDM   Final diagnoses:  Pleuritic chest pain   Doubt ACS.  No evidence pulmonary embolus.  This is likely left-sided pleurisy is also evident by the thickening of the pleura on the left side.  Discharge home in good condition.  Pulmonary followup.  Home with a short course of anti-inflammatories and hydrocodone   New Prescriptions   HYDROCODONE-ACETAMINOPHEN (NORCO/VICODIN) 5-325 MG PER TABLET    Take 1 tablet by mouth every 4 (four) hours as needed for moderate pain.   IBUPROFEN (ADVIL,MOTRIN) 600 MG TABLET    Take 1 tablet (600 mg total) by mouth every 8 (eight) hours as needed.        Hoy Morn, MD 10/22/13 1146

## 2013-12-03 ENCOUNTER — Encounter (HOSPITAL_COMMUNITY): Payer: Self-pay | Admitting: Emergency Medicine

## 2013-12-03 ENCOUNTER — Emergency Department (HOSPITAL_COMMUNITY)
Admission: EM | Admit: 2013-12-03 | Discharge: 2013-12-04 | Disposition: A | Discharge status: Home / Self Care | Payer: 59 | Attending: Emergency Medicine | Admitting: Emergency Medicine

## 2013-12-03 DIAGNOSIS — E079 Disorder of thyroid, unspecified: Secondary | ICD-10-CM | POA: Insufficient documentation

## 2013-12-03 DIAGNOSIS — S8001XA Contusion of right knee, initial encounter: Secondary | ICD-10-CM

## 2013-12-03 DIAGNOSIS — Z7982 Long term (current) use of aspirin: Secondary | ICD-10-CM | POA: Insufficient documentation

## 2013-12-03 DIAGNOSIS — Z79899 Other long term (current) drug therapy: Secondary | ICD-10-CM | POA: Insufficient documentation

## 2013-12-03 DIAGNOSIS — E119 Type 2 diabetes mellitus without complications: Secondary | ICD-10-CM | POA: Insufficient documentation

## 2013-12-03 DIAGNOSIS — Y92410 Unspecified street and highway as the place of occurrence of the external cause: Secondary | ICD-10-CM | POA: Insufficient documentation

## 2013-12-03 DIAGNOSIS — Z791 Long term (current) use of non-steroidal anti-inflammatories (NSAID): Secondary | ICD-10-CM | POA: Insufficient documentation

## 2013-12-03 DIAGNOSIS — S0990XA Unspecified injury of head, initial encounter: Secondary | ICD-10-CM | POA: Insufficient documentation

## 2013-12-03 DIAGNOSIS — Y9389 Activity, other specified: Secondary | ICD-10-CM | POA: Insufficient documentation

## 2013-12-03 DIAGNOSIS — S8000XA Contusion of unspecified knee, initial encounter: Secondary | ICD-10-CM | POA: Insufficient documentation

## 2013-12-03 DIAGNOSIS — S0993XA Unspecified injury of face, initial encounter: Secondary | ICD-10-CM | POA: Insufficient documentation

## 2013-12-03 DIAGNOSIS — R51 Headache: Secondary | ICD-10-CM | POA: Insufficient documentation

## 2013-12-03 DIAGNOSIS — Z87891 Personal history of nicotine dependence: Secondary | ICD-10-CM | POA: Insufficient documentation

## 2013-12-03 DIAGNOSIS — S199XXA Unspecified injury of neck, initial encounter: Secondary | ICD-10-CM

## 2013-12-03 DIAGNOSIS — I10 Essential (primary) hypertension: Secondary | ICD-10-CM | POA: Insufficient documentation
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
## 2013-12-04 ENCOUNTER — Emergency Department (HOSPITAL_COMMUNITY): Payer: 59

## 2013-12-04 MED ORDER — OXYCODONE-ACETAMINOPHEN 5-325 MG PO TABS
1.0000 | ORAL_TABLET | Freq: Once | ORAL | Status: AC
Start: 2013-12-04 — End: 2013-12-04
  Administered 2013-12-04: 1 via ORAL
  Filled 2013-12-04: qty 1
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
## 2013-12-04 NOTE — ED Notes (Signed)
Placed patient in C collar

## 2013-12-04 NOTE — ED Provider Notes (Signed)
Medical screening examination/treatment/procedure(s) were performed by non-physician practitioner and as supervising physician I was immediately available for consultation/collaboration.    Johnna Acosta, MD 12/04/13 [phone]

## 2016-07-30 ENCOUNTER — Other Ambulatory Visit: Payer: Self-pay | Admitting: Internal Medicine

## 2016-07-30 DIAGNOSIS — R1084 Generalized abdominal pain: Secondary | ICD-10-CM

## 2016-08-04 ENCOUNTER — Ambulatory Visit
Admission: RE | Admit: 2016-08-04 | Discharge: 2016-08-04 | Disposition: A | Discharge status: Home / Self Care | Payer: Self-pay | Source: Ambulatory Visit | Attending: Internal Medicine | Admitting: Internal Medicine

## 2016-08-04 DIAGNOSIS — R1084 Generalized abdominal pain: Secondary | ICD-10-CM

## 2017-04-24 ENCOUNTER — Encounter (HOSPITAL_COMMUNITY): Payer: Self-pay | Admitting: Emergency Medicine

## 2017-04-24 ENCOUNTER — Ambulatory Visit (HOSPITAL_COMMUNITY)
Admission: EM | Admit: 2017-04-24 | Discharge: 2017-04-24 | Disposition: A | Discharge status: Home / Self Care | Payer: Self-pay | Attending: Family Medicine | Admitting: Family Medicine

## 2017-04-24 DIAGNOSIS — J40 Bronchitis, not specified as acute or chronic: Secondary | ICD-10-CM

## 2017-04-24 MED ORDER — ALBUTEROL SULFATE HFA 108 (90 BASE) MCG/ACT IN AERS
1.0000 | INHALATION_SPRAY | RESPIRATORY_TRACT | 0 refills | Status: DC | PRN
Start: 2017-04-24 — End: 2017-05-11

## 2017-04-24 MED ORDER — PREDNISONE 20 MG PO TABS: 40.0000 mg | ORAL_TABLET | Freq: Every day | ORAL | 0 refills | Status: DC

## 2017-04-24 NOTE — Discharge Instructions (Signed)
Continue to push fluids, practice good hand hygiene, and cover your mouth if you cough.  If you start having fevers, shaking or shortness of breath, seek immediate care.  OK to try albuterol at home first. If no significant improvement by tomorrow, go ahead and take prednisone.  Mucinex (guaifenesin) may be helpful for your coughing.

## 2017-04-24 NOTE — ED Provider Notes (Signed)
[place]    CSN: [id number] Arrival date & time: 04/24/17  1205     History   Chief Complaint Chief Complaint  Patient presents with  . URI    HPI Patricia Mcclain is a 41 y.o. female.   HPI Duration: 3 days  Associated symptoms: sinus congestion, rhinorrhea, shortness of breath,wheezing and productive cough Denies: fevers/rigors, ear pain/drainage, itchy/water eyes, myalgias, sinus pain Treatment to date: none Sick contacts: Yes    Past Medical History:  Diagnosis Date  . Diabetes mellitus   . Hypertension   . Thyroid disease    Past Surgical History:  Procedure Laterality Date  . CESAREAN SECTION  2004    Home Medications    Prior to Admission medications   Medication Sig Start Date End Date Taking? Authorizing Provider  atenolol (TENORMIN) 50 MG tablet Take 50 mg by mouth daily.   Yes [provider]  glimepiride (AMARYL) 2 MG tablet Take 2 mg by mouth daily with breakfast.   Yes [provider]  levothyroxine (SYNTHROID, LEVOTHROID) 200 MCG tablet Take 200 mcg by mouth daily before breakfast. Take with 50mg  tablet for total dose of = 250mg    Yes [provider]  losartan (COZAAR) 100 MG tablet Take 100 mg by mouth daily.   Yes [provider]  sertraline (ZOLOFT) 100 MG tablet Take 100 mg by mouth daily.   Yes [provider]  sitaGLIPtin (JANUVIA) 100 MG tablet Take 100 mg by mouth daily.   Yes [provider]  albuterol (PROVENTIL HFA;VENTOLIN HFA) 108 (90 Base) MCG/ACT inhaler Inhale 1-2 puffs into the lungs every 4 (four) hours as needed for wheezing or shortness of breath. 04/24/17   Shelda Pal, DO  aspirin 81 MG tablet Take 81 mg by mouth daily.    [provider]  ibuprofen (ADVIL,MOTRIN) 600 MG tablet Take 1 tablet (600 mg total) by mouth every 8 (eight) hours as needed. 10/22/13   Jola Schmidt, MD  levothyroxine (SYNTHROID, LEVOTHROID) 50 MCG tablet Take 50 mcg by  mouth daily before breakfast. Take with 200mg  for a total = 250mg     [provider]  naproxen sodium (ANAPROX) 220 MG tablet Take 440 mg by mouth 2 (two) times daily with a meal.    [provider]  predniSONE (DELTASONE) 20 MG tablet Take 2 tablets (40 mg total) by mouth daily with breakfast. 04/24/17   Shelda Pal, DO    Family History History reviewed. No pertinent family history.  Social History Social History  Substance Use Topics  . Smoking status: Former Smoker    Quit date: 06/06/2013  . Smokeless tobacco: Never Used  . Alcohol use Yes     Allergies   Patient has no known allergies.   Review of Systems Review of Systems  Constitutional: Negative for fever.  Respiratory: Positive for cough.      Physical Exam Triage Vital Signs ED Triage Vitals [04/24/17 1250]  Enc Vitals Group     BP 119/81     Pulse Rate 98     Resp 16     Temp 97.7 F (36.5 C)     Temp Source Oral     SpO2 96 %   Updated Vital Signs BP 119/81 (BP Location: Left Arm)   Pulse 98   Temp 97.7 F (36.5 C) (Oral)   Resp 16   LMP 04/10/2017   SpO2 96%   Physical Exam  Constitutional: She appears well-developed and well-nourished.  HENT:  Head: Normocephalic.  Right Ear: External ear normal.  Left Ear: External ear normal.  Nose: Nose normal.  Mouth/Throat: Oropharynx is clear and moist. No oropharyngeal exudate.  Eyes: Pupils are equal, round, and reactive to light. EOM are normal.  Neck: Normal range of motion. Neck supple.  Cardiovascular: Normal rate and regular rhythm.   Pulmonary/Chest: Effort normal.  Faint expiratory wheezes diffusely  Skin: She is not diaphoretic.  Psychiatric: She has a normal mood and affect. Judgment normal.     UC Treatments / Results  Procedures Procedures none  Initial Impression / Assessment and Plan / UC Course  I have reviewed the triage vital signs and the nursing notes.  Pertinent labs & imaging results that  were available during my care of the patient were reviewed by me and considered in my medical decision making (see chart for details).     41 year old female presents with upper respiratory symptoms. No fevers/rigors. She did have faint wheezing with a history of asthma. Will treat with albuterol inhaler. Steroids also called in, using if no improvement with inhaler. Follow-up with PCP if symptoms fail to improve. The patient voiced understanding and agreement plan.  Final Clinical Impressions(s) / UC Diagnoses   Final diagnoses:  Wheezy bronchitis    New Prescriptions New Prescriptions   ALBUTEROL (PROVENTIL HFA;VENTOLIN HFA) 108 (90 BASE) MCG/ACT INHALER    Inhale 1-2 puffs into the lungs every 4 (four) hours as needed for wheezing or shortness of breath.   PREDNISONE (DELTASONE) 20 MG TABLET    Take 2 tablets (40 mg total) by mouth daily with breakfast.     Controlled Substance Prescriptions [place] Controlled Substance Registry consulted? Not Applicable   Shelda Pal, [place] 04/24/17 1316

## 2017-04-24 NOTE — ED Triage Notes (Signed)
Pt c/o cold sx onset: 3 days  Sx include: prod cough, SOB, nasal congestion/drainage  Denies: fevers, chills  Taking: OTC cold meds w/temp relief.   A&O x4... NAD

## 2017-05-10 ENCOUNTER — Emergency Department (HOSPITAL_COMMUNITY)
Admission: EM | Admit: 2017-05-10 | Discharge: 2017-05-11 | Disposition: A | Discharge status: Home / Self Care | Payer: Medicaid Other | Attending: Emergency Medicine | Admitting: Emergency Medicine

## 2017-05-10 ENCOUNTER — Encounter (HOSPITAL_COMMUNITY): Payer: Self-pay | Admitting: Emergency Medicine

## 2017-05-10 ENCOUNTER — Emergency Department (HOSPITAL_COMMUNITY): Payer: Medicaid Other

## 2017-05-10 DIAGNOSIS — R748 Abnormal levels of other serum enzymes: Secondary | ICD-10-CM | POA: Diagnosis not present

## 2017-05-10 DIAGNOSIS — E1165 Type 2 diabetes mellitus with hyperglycemia: Secondary | ICD-10-CM | POA: Insufficient documentation

## 2017-05-10 DIAGNOSIS — Z7984 Long term (current) use of oral hypoglycemic drugs: Secondary | ICD-10-CM | POA: Insufficient documentation

## 2017-05-10 DIAGNOSIS — R1110 Vomiting, unspecified: Secondary | ICD-10-CM | POA: Diagnosis not present

## 2017-05-10 DIAGNOSIS — R000 Tachycardia, unspecified: Secondary | ICD-10-CM | POA: Insufficient documentation

## 2017-05-10 DIAGNOSIS — R739 Hyperglycemia, unspecified: Secondary | ICD-10-CM

## 2017-05-10 DIAGNOSIS — K7689 Other specified diseases of liver: Secondary | ICD-10-CM | POA: Insufficient documentation

## 2017-05-10 DIAGNOSIS — Z87891 Personal history of nicotine dependence: Secondary | ICD-10-CM | POA: Insufficient documentation

## 2017-05-10 DIAGNOSIS — I10 Essential (primary) hypertension: Secondary | ICD-10-CM | POA: Diagnosis not present

## 2017-05-10 DIAGNOSIS — Z79899 Other long term (current) drug therapy: Secondary | ICD-10-CM | POA: Insufficient documentation

## 2017-05-10 DIAGNOSIS — R1011 Right upper quadrant pain: Secondary | ICD-10-CM | POA: Diagnosis present

## 2017-05-10 DIAGNOSIS — K769 Liver disease, unspecified: Secondary | ICD-10-CM

## 2017-05-10 DIAGNOSIS — R197 Diarrhea, unspecified: Secondary | ICD-10-CM | POA: Diagnosis not present

## 2017-05-10 DIAGNOSIS — R52 Pain, unspecified: Secondary | ICD-10-CM

## 2017-05-10 LAB — COMPREHENSIVE METABOLIC PANEL
ALT: 85 U/L — ABNORMAL HIGH (ref 14–54)
AST: 32 U/L (ref 15–41)
Albumin: 2.6 g/dL — ABNORMAL LOW (ref 3.5–5.0)
Alkaline Phosphatase: 292 U/L — ABNORMAL HIGH (ref 38–126)
Anion gap: 11 (ref 5–15)
BILIRUBIN TOTAL: 1.8 mg/dL — AB (ref 0.3–1.2)
BUN: 12 mg/dL (ref 6–20)
CALCIUM: 8.9 mg/dL (ref 8.9–10.3)
CO2: 26 mmol/L (ref 22–32)
CREATININE: 1.12 mg/dL — AB (ref 0.44–1.00)
Chloride: 93 mmol/L — ABNORMAL LOW (ref 101–111)
GFR calc Af Amer: >60 mL/min (ref >60)
GFR, EST NON AFRICAN AMERICAN: 60 mL/min — AB (ref >60)
Glucose, Bld: 517 mg/dL (ref 65–99)
POTASSIUM: 4 mmol/L (ref 3.5–5.1)
Sodium: 130 mmol/L — ABNORMAL LOW (ref 135–145)
TOTAL PROTEIN: 8 g/dL (ref 6.5–8.1)

## 2017-05-10 LAB — CBC
HEMATOCRIT: 33.5 % — AB (ref 36.0–46.0)
Hemoglobin: 11.1 g/dL — ABNORMAL LOW (ref 12.0–15.0)
MCH: 21.9 pg — ABNORMAL LOW (ref 26.0–34.0)
MCHC: 33.1 g/dL (ref 30.0–36.0)
MCV: 66.1 fL — ABNORMAL LOW (ref 78.0–100.0)
PLATELETS: 287 10*3/uL (ref 150–400)
RBC: 5.07 MIL/uL (ref 3.87–5.11)
RDW: 17.2 % — AB (ref 11.5–15.5)
WBC: 14.4 10*3/uL — AB (ref 4.0–10.5)

## 2017-05-10 LAB — I-STAT BETA HCG BLOOD, ED (MC, WL, AP ONLY)

## 2017-05-10 LAB — BLOOD GAS, VENOUS
ACID-BASE EXCESS: 2.7 mmol/L — AB (ref 0.0–2.0)
Bicarbonate: 27.1 mmol/L (ref 20.0–28.0)
FIO2: 21
O2 SAT: 82 %
Patient temperature: 98.2
pCO2, Ven: 42.5 mmHg — ABNORMAL LOW (ref 44.0–60.0)
pH, Ven: 7.419 (ref 7.250–7.430)
pO2, Ven: 48.9 mmHg — ABNORMAL HIGH (ref 32.0–45.0)

## 2017-05-10 LAB — LIPASE, BLOOD: Lipase: 18 U/L (ref 11–51)

## 2017-05-10 MED ORDER — ONDANSETRON 4 MG PO TBDP
4.0000 mg | ORAL_TABLET | Freq: Once | ORAL | Status: AC | PRN
  Administered 2017-05-10: 4 mg via ORAL
  Filled 2017-05-10: qty 1

## 2017-05-10 MED ORDER — SODIUM CHLORIDE 0.9 % IV BOLUS (SEPSIS)
1000.0000 mL | Freq: Once | INTRAVENOUS | Status: AC
  Administered 2017-05-10: 1000 mL via INTRAVENOUS

## 2017-05-10 NOTE — ED Triage Notes (Signed)
Patient c/o right side pain, n/v/d for couple weeks. 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  Patient reports that she is having burning with urination and "urinating too much".  Patient adds that she has been on menstrual cycle for past 2 months.
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
## 2017-05-10 NOTE — ED Provider Notes (Signed)
[facility] DEPT Provider Note   CSN: [id number] Arrival date & time: 05/10/17  1836     History   Chief Complaint Chief Complaint  Patient presents with  . Emesis  . Abdominal Pain  . Diarrhea    HPI Patricia Mcclain is a 41 y.o. female.  Patient is a 41 year old female with a history of diabetes, hypertension and thyroid disease presenting to the emergency room today for multiple complaints. Patient states approximately 2 weeks ago she had a cough and sputum production with some mild shortness of breath. She went to urgent care at that time and was told she had some wheezing and was given prednisone and an inhaler. She started taking the prednisone but after several days she started to feel worse with polyuria and polydipsia. She also started to develop nausea, vomiting and abdominal pain. Over the last 2 weeks she's been vomiting fairly regularly, anorexic and has significant pain more localized in her right upper quadrant and right kidney area. 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 She has had dysuria, frequency and urgency in the last week as well. She is felt chilled and feverish but has not taken her temperature. She continues to have cough and mild shortness of breath. She does not check her sugar regularly but does take her diabetic medications. She called her doctor today who recommended she come here for further evaluation.   The history is provided by the patient.  Emesis   This is a new problem. Associated symptoms include abdominal pain and diarrhea.  Abdominal Pain   Associated symptoms include diarrhea and vomiting.  Diarrhea   Associated symptoms include abdominal pain and vomiting.    Past Medical History:  Diagnosis Date  . Diabetes mellitus   . Hypertension   . Thyroid disease     There are no active problems to display for this patient.   Past Surgical History:  Procedure Laterality Date  . CESAREAN  SECTION  2004    OB History    No data available       Home Medications    Prior to Admission medications   Medication Sig Start Date End Date Taking? Authorizing Provider  atenolol (TENORMIN) 50 MG tablet Take 50 mg by mouth daily.   Yes [provider]  clonazePAM (KLONOPIN) 0.5 MG tablet Take 0.5 mg by mouth 3 (three) times daily as needed.   Yes [provider]  glimepiride (AMARYL) 2 MG tablet Take 2 mg by mouth daily with breakfast.   Yes [provider]  levothyroxine (SYNTHROID, LEVOTHROID) 200 MCG tablet Take 200 mcg by mouth daily before breakfast. Take with 50mg  tablet for total dose of = 250mg    Yes [provider]  losartan (COZAAR) 100 MG tablet Take 100 mg by mouth daily.   Yes [provider]  sertraline (ZOLOFT) 100 MG tablet Take 100 mg by mouth daily.   Yes [provider]  sitaGLIPtin (JANUVIA) 100 MG tablet Take 100 mg by mouth daily.   Yes [provider]  albuterol (PROVENTIL HFA;VENTOLIN HFA) 108 (90 Base) MCG/ACT inhaler Inhale 1-2 puffs into the lungs every 4 (four) hours as needed for wheezing or shortness of breath. Patient not taking: Reported on 05/10/2017 04/24/17   Shelda Pal, DO  ibuprofen (ADVIL,MOTRIN) 600 MG tablet Take 1 tablet (600 mg total) by mouth every 8 (eight) hours as needed. Patient not taking: Reported on 05/10/2017 10/22/13  Jola Schmidt, MD  predniSONE (DELTASONE) 20 MG tablet Take 2 tablets (40 mg total) by mouth daily with breakfast. Patient not taking: Reported on 05/10/2017 04/24/17   Shelda Pal, DO    Family History No family history on file.  Social History Social History  Substance Use Topics  . Smoking status: Former Smoker    Quit date: 06/06/2013  . Smokeless tobacco: Never Used  . Alcohol use Yes     Allergies   Patient has no known allergies.   Review of Systems Review of Systems  Gastrointestinal: Positive for abdominal pain,  diarrhea and vomiting.  All other systems reviewed and are negative.    Physical Exam Updated Vital Signs BP 91/67 (BP Location: Left Arm)   Pulse (!) 114   Temp 98.2 F (36.8 C) (Oral)   Resp 14   Ht 5\' 6"  (1.676 m)   Wt 122.5 kg (270 lb)   LMP 03/23/2017   SpO2 96%   BMI 43.58 kg/m   Physical Exam  Constitutional: She is oriented to person, place, and time. She appears well-developed and well-nourished. No distress.  HENT:  Head: Normocephalic and atraumatic.  Mouth/Throat: Mucous membranes are dry.  Eyes: Pupils are equal, round, and reactive to light. EOM are normal.  Fundoscopic exam:      The right eye shows no papilledema.       The left eye shows no papilledema.  Neck: Normal range of motion. Neck supple.  Cardiovascular: Regular rhythm, normal heart sounds and intact distal pulses.  Tachycardia present.  Exam reveals no friction rub.   No murmur heard. Pulmonary/Chest: Effort normal and breath sounds normal. She has no wheezes. She has no rales.  Abdominal: Soft. Bowel sounds are normal. She exhibits no distension. There is tenderness in the right upper quadrant and epigastric area. There is CVA tenderness. There is no rebound and no guarding.    Musculoskeletal: Normal range of motion. She exhibits no tenderness.  No edema  Lymphadenopathy:    She has no cervical adenopathy.  Neurological: She is alert and oriented to person, place, and time. She has normal strength. No cranial nerve deficit or sensory deficit. Gait normal.  photophobia  Skin: Skin is warm and dry. No rash noted.  Psychiatric: She has a normal mood and affect. Her behavior is normal.  Nursing note and vitals reviewed.    ED Treatments / Results  Labs (all labs ordered are listed, but only abnormal results are displayed) Labs Reviewed  COMPREHENSIVE METABOLIC PANEL - Abnormal; Notable for the following:       Result Value   Sodium 130 (*)    Chloride 93 (*)    Glucose, Bld 517 (*)     Creatinine, Ser 1.12 (*)    Albumin 2.6 (*)    ALT 85 (*)    Alkaline Phosphatase 292 (*)    Total Bilirubin 1.8 (*)    GFR calc non Af Amer 60 (*)    All other components within normal limits  CBC - Abnormal; Notable for the following:    WBC 14.4 (*)    Hemoglobin 11.1 (*)    HCT 33.5 (*)    MCV 66.1 (*)    MCH 21.9 (*)    RDW 17.2 (*)    All other components within normal limits  BLOOD GAS, VENOUS - Abnormal; Notable for the following:    pCO2, Ven 42.5 (*)    pO2, Ven 48.9 (*)    Acid-Base Excess 2.7 (*)  All other components within normal limits  LIPASE, BLOOD  URINALYSIS, ROUTINE W REFLEX MICROSCOPIC  I-STAT BETA HCG BLOOD, ED ([facility], [facility], [facility] ONLY)    EKG  EKG Interpretation None       Radiology US Abdomen Limited Ruq  Result Date: 05/10/2017 CLINICAL DATA:  Abdominal pain with nausea and vomiting 2 weeks. EXAM: ULTRASOUND ABDOMEN LIMITED RIGHT UPPER QUADRANT COMPARISON:  Ultrasound 08/04/2016 and CT 02/11/2011 FINDINGS: Gallbladder: Gallbladder slightly contracted without stones or sludge. No adjacent free fluid. Negative sonographic Murphy's sign. Borderline wall thickening measuring 3.2 mm. Common bile duct: Diameter: 3.7 mm. Liver: Numerous hypoechoic liver masses some which have a target appearance as this finding is new compared to the prior exams and suggest metastatic disease. Portal vein is patent on color Doppler imaging with normal direction of blood flow towards the liver. IMPRESSION: No acute gallbladder disease. Numerous hypodense liver masses new since the previous exams concerning for metastatic disease. Electronically Signed   By: Marin Olp M.D.   On: 05/10/2017 22:57    Procedures Procedures (including critical care time)  Medications Ordered in ED Medications  ondansetron (ZOFRAN-ODT) disintegrating tablet 4 mg (4 mg Oral Given 05/10/17 1854)  sodium chloride 0.9 % bolus 1,000 mL (1,000 mLs Intravenous New Bag/Given 05/10/17 2152)     Initial  Impression / Assessment and Plan / ED Course  I have reviewed the triage vital signs and the nursing notes.  Pertinent labs & imaging results that were available during my care of the patient were reviewed by me and considered in my medical decision making (see chart for details).     Patient presenting today with 2 weeks of declining health with nausea, vomiting some diarrhea, abdominal pain. Symptoms started with coughing congestion. She was given steroids at urgent care and symptoms worsened. She has had no appetite and been able to hold down much for the last few weeks. She is also complaining of urinary symptoms. 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 She was concerned for infection of her kidney as the cause of her symptoms and her doctor recommended she come here for further evaluation. Patient does have right upper quadrant and right flank pain today. She is tachycardic and mildly hypotensive. She appears in no acute distress at this time. And is found to be hyperglycemic with her sugar at 500 but no anion gap. Mild elevation of her ALT and alkaline phosphatase is extremely high. T bili is slightly high 1.8. Lipase within normal limits.  Venous blood gas without acute findings consistent with DKA. UA is still pending. Pregnancy test is negative. Initial ultrasound was done to evaluate the gallbladder based on patient's symptoms. That showed multiple new hypodense lesions in the liver concerning for potential metastatic disease. Patient has no diagnosis of cancer in the past. Her renal function is basically unchanged. Will do a CT of her chest and abdomen without contrast to evaluate for any type of masses or cause for these symptoms. Patient given IV fluids for her hyperglycemia. At this time she does not want nausea or pain medication.  Final Clinical Impressions(s) / ED Diagnoses   Final diagnoses:  Pain    New Prescriptions New Prescriptions     No medications on file     Blanchie Dessert, MD 05/10/17 2326

## 2017-05-10 NOTE — ED Notes (Signed)
Patient stated she is on her "menstrual cycle" and is "afriad this could affect her urine specimen".

## 2017-05-10 NOTE — ED Notes (Signed)
Writer mentioned to patient that and in and out cath could be completed due to patient being on their menstrual cycle. Patient declined an in and out cath and stated "Don't ask me that stupid sh*t. That's nasty". Writer then asked if patient could give a clean catch for a urine specimen and patient stated "I can in a few minutes".

## 2017-05-11 LAB — URINALYSIS, ROUTINE W REFLEX MICROSCOPIC
Bilirubin Urine: NEGATIVE
Glucose, UA: >=500 mg/dL — AB
Ketones, ur: NEGATIVE mg/dL
Leukocytes, UA: NEGATIVE
Nitrite: NEGATIVE
PH: 6 (ref 5.0–8.0)
Protein, ur: NEGATIVE mg/dL
SPECIFIC GRAVITY, URINE: 1.018 (ref 1.005–1.030)

## 2017-05-11 LAB — CBG MONITORING, ED
GLUCOSE-CAPILLARY: 589 mg/dL — AB (ref 65–99)
GLUCOSE-CAPILLARY: 459 mg/dL — AB (ref 65–99)
GLUCOSE-CAPILLARY: 453 mg/dL — AB (ref 65–99)
GLUCOSE-CAPILLARY: 404 mg/dL — AB (ref 65–99)
Glucose-Capillary: >600 mg/dL (ref 65–99)
Glucose-Capillary: 388 mg/dL — ABNORMAL HIGH (ref 65–99)

## 2017-05-11 MED ORDER — ONDANSETRON HCL 4 MG PO TABS: 4.0000 mg | ORAL_TABLET | Freq: Four times a day (QID) | ORAL | 0 refills | Status: AC | PRN

## 2017-05-11 MED ORDER — OXYCODONE HCL 5 MG PO TABS: 5.0000 mg | ORAL_TABLET | ORAL | 0 refills | Status: DC | PRN

## 2017-05-11 MED ORDER — INSULIN ASPART 100 UNIT/ML ~~LOC~~ SOLN
10.0000 [IU] | Freq: Once | SUBCUTANEOUS | Status: AC
  Administered 2017-05-11: 10 [IU] via INTRAVENOUS
  Filled 2017-05-11: qty 1

## 2017-05-11 MED ORDER — MORPHINE SULFATE (PF) 4 MG/ML IV SOLN
4.0000 mg | Freq: Once | INTRAVENOUS | Status: AC
  Administered 2017-05-11: 4 mg via INTRAVENOUS
  Filled 2017-05-11: qty 1

## 2017-05-11 NOTE — Discharge Instructions (Signed)
Please see Dr. Carlota Raspberry as soon as possible to set up further evaluation of the lesions in your liver. Return if you are having any problems.

## 2017-05-11 NOTE — ED Provider Notes (Signed)
Patient signed out to me to evaluate CT of chest, abdomen, pelvis. CT is ordered because of abnormal liver function tests and ultrasound. CT shows probable liver metastases without a primary focus identified. She had good relief of nausea with ondansetron, but continues to have abdominal pain. She is given a dose of morphine. Glucose was initially very high. This will be repeated.  He beat glucose, greater than 600. She was given a series of injections of NovoLog with successful reduction of low glucose 2 388. She is discharged with prescriptions for oxycodone and ondansetron, follow-up with PCP to arrange outpatient evaluation of the liver lesions.  Results for orders placed or performed during the hospital encounter of 05/10/17  Lipase, blood  Result Value Ref Range   Lipase 18 11 - 51 U/L  Comprehensive metabolic panel  Result Value Ref Range   Sodium 130 (L) 135 - 145 mmol/L   Potassium 4.0 3.5 - 5.1 mmol/L   Chloride 93 (L) 101 - 111 mmol/L   CO2 26 22 - 32 mmol/L   Glucose, Bld 517 (HH) 65 - 99 mg/dL   BUN 12 6 - 20 mg/dL   Creatinine, Ser 1.12 (H) 0.44 - 1.00 mg/dL   Calcium 8.9 8.9 - 10.3 mg/dL   Total Protein 8.0 6.5 - 8.1 g/dL   Albumin 2.6 (L) 3.5 - 5.0 g/dL   AST 32 15 - 41 U/L   ALT 85 (H) 14 - 54 U/L   Alkaline Phosphatase 292 (H) 38 - 126 U/L   Total Bilirubin 1.8 (H) 0.3 - 1.2 mg/dL   GFR calc non Af Amer 60 (L) >60 mL/min   GFR calc Af Amer >60 >60 mL/min   Anion gap 11 5 - 15  CBC  Result Value Ref Range   WBC 14.4 (H) 4.0 - 10.5 K/uL   RBC 5.07 3.87 - 5.11 MIL/uL   Hemoglobin 11.1 (L) 12.0 - 15.0 g/dL   HCT 33.5 (L) 36.0 - 46.0 %   MCV 66.1 (L) 78.0 - 100.0 fL   MCH 21.9 (L) 26.0 - 34.0 pg   MCHC 33.1 30.0 - 36.0 g/dL   RDW 17.2 (H) 11.5 - 15.5 %   Platelets 287 150 - 400 K/uL  Urinalysis, Routine w reflex microscopic  Result Value Ref Range   Color, Urine YELLOW YELLOW   APPearance CLEAR CLEAR   Specific Gravity, Urine 1.018 1.005 - 1.030   pH 6.0 5.0 -  8.0   Glucose, UA >=500 (A) NEGATIVE mg/dL   Hgb urine dipstick LARGE (A) NEGATIVE   Bilirubin Urine NEGATIVE NEGATIVE   Ketones, ur NEGATIVE NEGATIVE mg/dL   Protein, ur NEGATIVE NEGATIVE mg/dL   Nitrite NEGATIVE NEGATIVE   Leukocytes, UA NEGATIVE NEGATIVE   RBC / HPF 0-5 0 - 5 RBC/hpf   WBC, UA 6-30 0 - 5 WBC/hpf   Bacteria, UA MANY (A) NONE SEEN   Squamous Epithelial / LPF 0-5 (A) NONE SEEN  Blood gas, venous  Result Value Ref Range   FIO2 21.00    pH, Ven 7.419 7.250 - 7.430   pCO2, Ven 42.5 (L) 44.0 - 60.0 mmHg   pO2, Ven 48.9 (H) 32.0 - 45.0 mmHg   Bicarbonate 27.1 20.0 - 28.0 mmol/L   Acid-Base Excess 2.7 (H) 0.0 - 2.0 mmol/L   O2 Saturation 82.0 %   Patient temperature 98.2    Collection site VEIN    Drawn by DRAWN BY RN    Sample type VENOUS   I-Stat  beta hCG blood, ED  Result Value Ref Range   I-stat hCG, quantitative <5.0 <5 mIU/mL   Comment 3          POC CBG, ED  Result Value Ref Range   Glucose-Capillary >600 (HH) 65 - 99 mg/dL   Comment 1 Notify RN   CBG monitoring, ED  Result Value Ref Range   Glucose-Capillary 589 (HH) 65 - 99 mg/dL  POC CBG, ED  Result Value Ref Range   Glucose-Capillary 459 (H) 65 - 99 mg/dL  CBG monitoring, ED  Result Value Ref Range   Glucose-Capillary 453 (H) 65 - 99 mg/dL  POC CBG, ED  Result Value Ref Range   Glucose-Capillary 388 (H) 65 - 99 mg/dL  CBG monitoring, ED  Result Value Ref Range   Glucose-Capillary 404 (H) 65 - 99 mg/dL   Comment 1 Notify RN    Comment 2 Document in Chart    Ct Abdomen Pelvis Wo Contrast  Result Date: 05/11/2017 CLINICAL DATA:  Productive cough with mild shortness-of-breath and right-sided pain with nausea, vomiting and diarrhea 2 weeks. Dysuria and frequent urination. EXAM: CT CHEST, ABDOMEN AND PELVIS WITHOUT CONTRAST TECHNIQUE: Multidetector CT imaging of the chest, abdomen and pelvis was performed following the standard protocol without IV contrast. COMPARISON:  Chest CT 10/22/2013 and  abdominopelvic CT 02/11/2011 FINDINGS: CT CHEST FINDINGS Cardiovascular: Heart is normal size. Vascular structures are unremarkable. Mediastinum/Nodes: No evidence of mediastinal or hilar adenopathy. Remaining mediastinal structures are within normal. Lungs/Pleura: Lungs are adequately inflated and demonstrate focal airspace opacification of the posterior right lower lobe which may be due to atelectasis, infection and less likely neoplasm. 3 mm nodule over the left apex unchanged from 2015 small nodule over the lateral left upper lobe unchanged from 2015. Minimal scarring over the lingula. Airways are within normal. Musculoskeletal: Within normal. CT ABDOMEN PELVIS FINDINGS Hepatobiliary: Numerous hypodense masses throughout the liver new compared to the previous exams. Gallbladder and biliary tree are within normal. Pancreas: Within normal. Spleen: Within normal. Adrenals/Urinary Tract: Adrenal glands are normal. No left-sided kidney. Right kidney demonstrates a slightly abnormal orientation and more lateral position. Mild prominence of the right intrarenal collecting system although no renal stones. Mild prominence of the right ureter without ureteral calculi. Bladder is within normal. Stomach/Bowel: Stomach and small bowel are within normal. Appendix is normal. Colon is within normal. Vascular/Lymphatic: Vascular structures are within normal. There are a few small periaortic lymph nodes as well as several small lymph nodes over the upper abdomen the region of the gastrohepatic ligament and celiac axis. Reproductive: 4.1 cm right ovarian cyst. Left ovary and uterus are unremarkable. Other: No free fluid or focal inflammatory change. Musculoskeletal: Within normal. IMPRESSION: Mild focal airspace opacification over the periphery of the right lower lobe which may be due to atelectasis or infection and less likely neoplasm. Recommend follow-up chest CT 3-4 weeks. Two subcentimeter left lung nodules stable since 2015.  Numerous hypodense masses throughout the liver new compared to the previous exam is concerning for metastatic disease. Consider PET-CT versus tissue sampling. Mild increased number of small lymph nodes over the upper abdomen as described. Absent left kidney. Minimal prominence of the right intrarenal collecting system and ureter without stones or mass identified. 4.1 cm right ovarian cyst. Electronically Signed   By: Marin Olp M.D.   On: 05/11/2017 00:31   Ct Chest Wo Contrast  Result Date: 05/11/2017 CLINICAL DATA:  Productive cough with mild shortness-of-breath and right-sided pain with nausea, vomiting and  diarrhea 2 weeks. Dysuria and frequent urination. EXAM: CT CHEST, ABDOMEN AND PELVIS WITHOUT CONTRAST TECHNIQUE: Multidetector CT imaging of the chest, abdomen and pelvis was performed following the standard protocol without IV contrast. COMPARISON:  Chest CT 10/22/2013 and abdominopelvic CT 02/11/2011 FINDINGS: CT CHEST FINDINGS Cardiovascular: Heart is normal size. Vascular structures are unremarkable. Mediastinum/Nodes: No evidence of mediastinal or hilar adenopathy. Remaining mediastinal structures are within normal. Lungs/Pleura: Lungs are adequately inflated and demonstrate focal airspace opacification of the posterior right lower lobe which may be due to atelectasis, infection and less likely neoplasm. 3 mm nodule over the left apex unchanged from 2015 small nodule over the lateral left upper lobe unchanged from 2015. Minimal scarring over the lingula. Airways are within normal. Musculoskeletal: Within normal. CT ABDOMEN PELVIS FINDINGS Hepatobiliary: Numerous hypodense masses throughout the liver new compared to the previous exams. Gallbladder and biliary tree are within normal. Pancreas: Within normal. Spleen: Within normal. Adrenals/Urinary Tract: Adrenal glands are normal. No left-sided kidney. Right kidney demonstrates a slightly abnormal orientation and more lateral position. Mild  prominence of the right intrarenal collecting system although no renal stones. Mild prominence of the right ureter without ureteral calculi. Bladder is within normal. Stomach/Bowel: Stomach and small bowel are within normal. Appendix is normal. Colon is within normal. Vascular/Lymphatic: Vascular structures are within normal. There are a few small periaortic lymph nodes as well as several small lymph nodes over the upper abdomen the region of the gastrohepatic ligament and celiac axis. Reproductive: 4.1 cm right ovarian cyst. Left ovary and uterus are unremarkable. Other: No free fluid or focal inflammatory change. Musculoskeletal: Within normal. IMPRESSION: Mild focal airspace opacification over the periphery of the right lower lobe which may be due to atelectasis or infection and less likely neoplasm. Recommend follow-up chest CT 3-4 weeks. Two subcentimeter left lung nodules stable since 2015. Numerous hypodense masses throughout the liver new compared to the previous exam is concerning for metastatic disease. Consider PET-CT versus tissue sampling. Mild increased number of small lymph nodes over the upper abdomen as described. Absent left kidney. Minimal prominence of the right intrarenal collecting system and ureter without stones or mass identified. 4.1 cm right ovarian cyst. Electronically Signed   By: Marin Olp M.D.   On: 05/11/2017 00:31   US Abdomen Limited Ruq  Result Date: 05/10/2017 CLINICAL DATA:  Abdominal pain with nausea and vomiting 2 weeks. EXAM: ULTRASOUND ABDOMEN LIMITED RIGHT UPPER QUADRANT COMPARISON:  Ultrasound 08/04/2016 and CT 02/11/2011 FINDINGS: Gallbladder: Gallbladder slightly contracted without stones or sludge. No adjacent free fluid. Negative sonographic Murphy's sign. Borderline wall thickening measuring 3.2 mm. Common bile duct: Diameter: 3.7 mm. Liver: Numerous hypoechoic liver masses some which have a target appearance as this finding is new compared to the prior exams  and suggest metastatic disease. Portal vein is patent on color Doppler imaging with normal direction of blood flow towards the liver. IMPRESSION: No acute gallbladder disease. Numerous hypodense liver masses new since the previous exams concerning for metastatic disease. Electronically Signed   By: Marin Olp M.D.   On: 75/44/9201 00:71      Delora Fuel, MD 21/97/58 [id number]

## 2017-05-12 ENCOUNTER — Other Ambulatory Visit (HOSPITAL_COMMUNITY): Payer: Self-pay | Admitting: Internal Medicine

## 2017-05-12 DIAGNOSIS — C229 Malignant neoplasm of liver, not specified as primary or secondary: Secondary | ICD-10-CM

## 2017-05-13 ENCOUNTER — Inpatient Hospital Stay (HOSPITAL_COMMUNITY)
Admission: EM | Admit: 2017-05-13 | Discharge: 2017-05-15 | DRG: 872 | Disposition: A | Discharge status: Home / Self Care | Payer: Medicaid Other | Attending: Internal Medicine | Admitting: Internal Medicine

## 2017-05-13 ENCOUNTER — Encounter (HOSPITAL_COMMUNITY): Payer: Self-pay | Admitting: Emergency Medicine

## 2017-05-13 ENCOUNTER — Other Ambulatory Visit: Payer: Self-pay

## 2017-05-13 ENCOUNTER — Emergency Department (HOSPITAL_COMMUNITY): Payer: Medicaid Other

## 2017-05-13 DIAGNOSIS — E1165 Type 2 diabetes mellitus with hyperglycemia: Secondary | ICD-10-CM | POA: Diagnosis present

## 2017-05-13 DIAGNOSIS — A419 Sepsis, unspecified organism: Secondary | ICD-10-CM | POA: Diagnosis present

## 2017-05-13 DIAGNOSIS — E1122 Type 2 diabetes mellitus with diabetic chronic kidney disease: Secondary | ICD-10-CM | POA: Diagnosis present

## 2017-05-13 DIAGNOSIS — N179 Acute kidney failure, unspecified: Secondary | ICD-10-CM | POA: Diagnosis present

## 2017-05-13 DIAGNOSIS — Z6841 Body Mass Index (BMI) 40.0 and over, adult: Secondary | ICD-10-CM | POA: Diagnosis not present

## 2017-05-13 DIAGNOSIS — Z9114 Patient's other noncompliance with medication regimen: Secondary | ICD-10-CM

## 2017-05-13 DIAGNOSIS — Z79899 Other long term (current) drug therapy: Secondary | ICD-10-CM | POA: Diagnosis not present

## 2017-05-13 DIAGNOSIS — Z905 Acquired absence of kidney: Secondary | ICD-10-CM

## 2017-05-13 DIAGNOSIS — R000 Tachycardia, unspecified: Secondary | ICD-10-CM | POA: Diagnosis present

## 2017-05-13 DIAGNOSIS — A4151 Sepsis due to Escherichia coli [E. coli]: Secondary | ICD-10-CM | POA: Diagnosis present

## 2017-05-13 DIAGNOSIS — I10 Essential (primary) hypertension: Secondary | ICD-10-CM | POA: Diagnosis present

## 2017-05-13 DIAGNOSIS — N12 Tubulo-interstitial nephritis, not specified as acute or chronic: Secondary | ICD-10-CM | POA: Diagnosis present

## 2017-05-13 DIAGNOSIS — Z87891 Personal history of nicotine dependence: Secondary | ICD-10-CM

## 2017-05-13 DIAGNOSIS — E860 Dehydration: Secondary | ICD-10-CM | POA: Diagnosis present

## 2017-05-13 DIAGNOSIS — E871 Hypo-osmolality and hyponatremia: Secondary | ICD-10-CM | POA: Diagnosis present

## 2017-05-13 DIAGNOSIS — R160 Hepatomegaly, not elsewhere classified: Secondary | ICD-10-CM | POA: Diagnosis present

## 2017-05-13 DIAGNOSIS — E1129 Type 2 diabetes mellitus with other diabetic kidney complication: Secondary | ICD-10-CM | POA: Diagnosis present

## 2017-05-13 DIAGNOSIS — Q600 Renal agenesis, unilateral: Secondary | ICD-10-CM

## 2017-05-13 DIAGNOSIS — E039 Hypothyroidism, unspecified: Secondary | ICD-10-CM

## 2017-05-13 DIAGNOSIS — N182 Chronic kidney disease, stage 2 (mild): Secondary | ICD-10-CM | POA: Diagnosis present

## 2017-05-13 DIAGNOSIS — N189 Chronic kidney disease, unspecified: Secondary | ICD-10-CM | POA: Diagnosis present

## 2017-05-13 HISTORY — DX: Renal agenesis, unilateral: Q60.0

## 2017-05-13 LAB — APTT: APTT: 36 s (ref 24–36)

## 2017-05-13 LAB — URINALYSIS, ROUTINE W REFLEX MICROSCOPIC
Bilirubin Urine: NEGATIVE
GLUCOSE, UA: NEGATIVE mg/dL
Ketones, ur: NEGATIVE mg/dL
NITRITE: NEGATIVE
Protein, ur: 100 mg/dL — AB
SPECIFIC GRAVITY, URINE: 1.012 (ref 1.005–1.030)
pH: 5 (ref 5.0–8.0)

## 2017-05-13 LAB — CBG MONITORING, ED: Glucose-Capillary: 339 mg/dL — ABNORMAL HIGH (ref 65–99)

## 2017-05-13 LAB — COMPREHENSIVE METABOLIC PANEL
ALBUMIN: 2.4 g/dL — AB (ref 3.5–5.0)
ALK PHOS: 272 U/L — AB (ref 38–126)
ALT: 51 U/L (ref 14–54)
ANION GAP: 16 — AB (ref 5–15)
AST: 64 U/L — AB (ref 15–41)
BILIRUBIN TOTAL: 2.2 mg/dL — AB (ref 0.3–1.2)
BUN: 12 mg/dL (ref 6–20)
CALCIUM: 8.3 mg/dL — AB (ref 8.9–10.3)
CO2: 22 mmol/L (ref 22–32)
Chloride: 91 mmol/L — ABNORMAL LOW (ref 101–111)
Creatinine, Ser: 1.28 mg/dL — ABNORMAL HIGH (ref 0.44–1.00)
GFR calc Af Amer: 59 mL/min — ABNORMAL LOW (ref >60)
GFR calc non Af Amer: 51 mL/min — ABNORMAL LOW (ref >60)
GLUCOSE: 332 mg/dL — AB (ref 65–99)
Potassium: 4.3 mmol/L (ref 3.5–5.1)
Sodium: 129 mmol/L — ABNORMAL LOW (ref 135–145)
TOTAL PROTEIN: 7.2 g/dL (ref 6.5–8.1)

## 2017-05-13 LAB — BLOOD GAS, VENOUS
Acid-Base Excess: 0.1 mmol/L (ref 0.0–2.0)
BICARBONATE: 24.3 mmol/L (ref 20.0–28.0)
FIO2: 21
O2 Saturation: 84.1 %
PATIENT TEMPERATURE: 98.6
PH VEN: 7.399 (ref 7.250–7.430)
PO2 VEN: 53.6 mmHg — AB (ref 32.0–45.0)
pCO2, Ven: 40.2 mmHg — ABNORMAL LOW (ref 44.0–60.0)

## 2017-05-13 LAB — CBC WITH DIFFERENTIAL/PLATELET
BASOS ABS: 0 10*3/uL (ref 0.0–0.1)
Basophils Relative: 0 %
EOS ABS: 0.2 10*3/uL (ref 0.0–0.7)
Eosinophils Relative: 1 %
HCT: 32.9 % — ABNORMAL LOW (ref 36.0–46.0)
Hemoglobin: 10.9 g/dL — ABNORMAL LOW (ref 12.0–15.0)
LYMPHS ABS: 1.2 10*3/uL (ref 0.7–4.0)
Lymphocytes Relative: 8 %
MCH: 22.1 pg — ABNORMAL LOW (ref 26.0–34.0)
MCHC: 33.1 g/dL (ref 30.0–36.0)
MCV: 66.6 fL — ABNORMAL LOW (ref 78.0–100.0)
MONO ABS: 1.2 10*3/uL — AB (ref 0.1–1.0)
Monocytes Relative: 8 %
NEUTROS ABS: 12.5 10*3/uL — AB (ref 1.7–7.7)
Neutrophils Relative %: 83 %
Platelets: 335 10*3/uL (ref 150–400)
RBC: 4.94 MIL/uL (ref 3.87–5.11)
RDW: 17.3 % — AB (ref 11.5–15.5)
WBC: 15.1 10*3/uL — ABNORMAL HIGH (ref 4.0–10.5)

## 2017-05-13 LAB — PROCALCITONIN: Procalcitonin: 5.55 ng/mL

## 2017-05-13 LAB — CREATININE, SERUM
Creatinine, Ser: 1.34 mg/dL — ABNORMAL HIGH (ref 0.44–1.00)
GFR, EST AFRICAN AMERICAN: 56 mL/min — AB (ref >60)
GFR, EST NON AFRICAN AMERICAN: 48 mL/min — AB (ref >60)

## 2017-05-13 LAB — CG4 I-STAT (LACTIC ACID): LACTIC ACID, VENOUS: 2.03 mmol/L — AB (ref 0.5–1.9)

## 2017-05-13 LAB — CBC
HEMATOCRIT: 32.8 % — AB (ref 36.0–46.0)
HEMOGLOBIN: 10.8 g/dL — AB (ref 12.0–15.0)
MCH: 22.1 pg — ABNORMAL LOW (ref 26.0–34.0)
MCHC: 32.9 g/dL (ref 30.0–36.0)
MCV: 67.1 fL — ABNORMAL LOW (ref 78.0–100.0)
Platelets: 349 10*3/uL (ref 150–400)
RBC: 4.89 MIL/uL (ref 3.87–5.11)
RDW: 17.4 % — AB (ref 11.5–15.5)
WBC: 13.5 10*3/uL — AB (ref 4.0–10.5)

## 2017-05-13 LAB — TSH: TSH: 13.041 u[IU]/mL — ABNORMAL HIGH (ref 0.350–4.500)

## 2017-05-13 LAB — PHOSPHORUS: Phosphorus: 3.2 mg/dL (ref 2.5–4.6)

## 2017-05-13 LAB — MAGNESIUM: MAGNESIUM: 1.5 mg/dL — AB (ref 1.7–2.4)

## 2017-05-13 LAB — LACTIC ACID, PLASMA: Lactic Acid, Venous: 2.1 mmol/L (ref 0.5–1.9)

## 2017-05-13 LAB — PROTIME-INR
INR: 1.26
Prothrombin Time: 15.7 seconds — ABNORMAL HIGH (ref 11.4–15.2)

## 2017-05-13 LAB — I-STAT CG4 LACTIC ACID, ED: Lactic Acid, Venous: 1.63 mmol/L (ref 0.5–1.9)

## 2017-05-13 LAB — GLUCOSE, CAPILLARY: Glucose-Capillary: 348 mg/dL — ABNORMAL HIGH (ref 65–99)

## 2017-05-13 MED ORDER — SODIUM CHLORIDE 0.9 % IV SOLN
INTRAVENOUS | Status: DC
  Administered 2017-05-13 – 2017-05-14 (×3): via INTRAVENOUS

## 2017-05-13 MED ORDER — INSULIN DETEMIR 100 UNIT/ML ~~LOC~~ SOLN
15.0000 [IU] | Freq: Every day | SUBCUTANEOUS | Status: DC
  Administered 2017-05-13: 15 [IU] via SUBCUTANEOUS
  Filled 2017-05-13 (×2): qty 0.15

## 2017-05-13 MED ORDER — SODIUM CHLORIDE 0.9 % IV BOLUS (SEPSIS)
1000.0000 mL | Freq: Once | INTRAVENOUS | Status: AC
  Administered 2017-05-13: 1000 mL via INTRAVENOUS

## 2017-05-13 MED ORDER — ACETAMINOPHEN 325 MG PO TABS
650.0000 mg | ORAL_TABLET | Freq: Four times a day (QID) | ORAL | Status: DC | PRN
  Administered 2017-05-13 – 2017-05-14 (×2): 650 mg via ORAL
  Filled 2017-05-13 (×2): qty 2

## 2017-05-13 MED ORDER — HEPARIN SODIUM (PORCINE) 5000 UNIT/ML IJ SOLN
5000.0000 [IU] | Freq: Three times a day (TID) | INTRAMUSCULAR | Status: DC
  Administered 2017-05-13 – 2017-05-15 (×5): 5000 [IU] via SUBCUTANEOUS
  Filled 2017-05-13 (×5): qty 1

## 2017-05-13 MED ORDER — DEXTROSE 5 % IV SOLN
2.0000 g | INTRAVENOUS | Status: DC
  Administered 2017-05-13 – 2017-05-14 (×2): 2 g via INTRAVENOUS
  Filled 2017-05-13 (×3): qty 2

## 2017-05-13 MED ORDER — ONDANSETRON HCL 4 MG/2ML IJ SOLN
4.0000 mg | Freq: Once | INTRAMUSCULAR | Status: AC
  Administered 2017-05-13: 4 mg via INTRAVENOUS
  Filled 2017-05-13: qty 2

## 2017-05-13 MED ORDER — INSULIN ASPART 100 UNIT/ML ~~LOC~~ SOLN
0.0000 [IU] | Freq: Three times a day (TID) | SUBCUTANEOUS | Status: DC
  Administered 2017-05-14 (×2): 8 [IU] via SUBCUTANEOUS

## 2017-05-13 MED ORDER — SERTRALINE HCL 100 MG PO TABS
100.0000 mg | ORAL_TABLET | Freq: Every day | ORAL | Status: DC
  Administered 2017-05-14 – 2017-05-15 (×2): 100 mg via ORAL
  Filled 2017-05-13 (×2): qty 1

## 2017-05-13 MED ORDER — CLONAZEPAM 0.5 MG PO TABS: 0.5000 mg | ORAL_TABLET | Freq: Three times a day (TID) | ORAL | Status: DC | PRN

## 2017-05-13 MED ORDER — MORPHINE SULFATE (PF) 2 MG/ML IV SOLN: 2.0000 mg | INTRAVENOUS | Status: DC | PRN

## 2017-05-13 MED ORDER — ONDANSETRON HCL 4 MG/2ML IJ SOLN: 4.0000 mg | Freq: Four times a day (QID) | INTRAMUSCULAR | Status: DC | PRN

## 2017-05-13 MED ORDER — LEVOTHYROXINE SODIUM 50 MCG PO TABS
250.0000 ug | ORAL_TABLET | Freq: Every day | ORAL | Status: DC
  Administered 2017-05-14 – 2017-05-15 (×2): 250 ug via ORAL
  Filled 2017-05-13 (×2): qty 2

## 2017-05-13 MED ORDER — MORPHINE SULFATE (PF) 4 MG/ML IV SOLN
4.0000 mg | Freq: Once | INTRAVENOUS | Status: AC
  Administered 2017-05-13: 4 mg via INTRAVENOUS
  Filled 2017-05-13: qty 1

## 2017-05-13 MED ORDER — ACETAMINOPHEN 650 MG RE SUPP: 650.0000 mg | Freq: Four times a day (QID) | RECTAL | Status: DC | PRN

## 2017-05-13 MED ORDER — DEXTROSE 5 % IV SOLN
1.0000 g | Freq: Once | INTRAVENOUS | Status: AC
  Administered 2017-05-13: 1 g via INTRAVENOUS
  Filled 2017-05-13: qty 1

## 2017-05-13 MED ORDER — MORPHINE SULFATE (PF) 4 MG/ML IV SOLN: 2.0000 mg | INTRAVENOUS | Status: DC | PRN

## 2017-05-13 MED ORDER — SODIUM CHLORIDE 0.9 % IV BOLUS (SEPSIS)
500.0000 mL | Freq: Once | INTRAVENOUS | Status: AC
  Administered 2017-05-13: 500 mL via INTRAVENOUS

## 2017-05-13 MED ORDER — ACETAMINOPHEN 325 MG PO TABS
650.0000 mg | ORAL_TABLET | Freq: Once | ORAL | Status: AC | PRN
  Administered 2017-05-13: 650 mg via ORAL
  Filled 2017-05-13: qty 2

## 2017-05-13 MED ORDER — ONDANSETRON HCL 4 MG PO TABS: 4.0000 mg | ORAL_TABLET | Freq: Four times a day (QID) | ORAL | Status: DC | PRN

## 2017-05-13 NOTE — ED Provider Notes (Signed)
[facility] DEPT Provider Note   CSN: [id number] Arrival date & time: 05/13/17  1122     History   Chief Complaint Chief Complaint  Patient presents with  . Flank Pain    HPI ALONNIE BIEKER is a 41 y.o. female with a h/o of DM, congenital absence of left kidney, and thyroid disease who presents to the emergency department with a chief complaint of right flank pain that radiates down to her LLQ with associated fever, nausea, and non-bloody, non-bilious vomiting. She reports that her flank pain initially began 3 weeks ago and has progressively worsened. She describes the pain as constant, sharp. She was evaluated in the emergency department on 10/2. 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 Her glucose improved from 600 to 388 during her visit with Novolog injections and she was discharged to home with pain medication, Zofran, and follow up to her PCP for evaluation of the liver lesions.   She reports that her symptoms continued to worsen and she developed a fever yesterday. Tmax 101. She also complains of dysuria. No vaginal pain or discharge. She reports poor fluid and po intake due to vomiting. No treatment prior to arrival.  The history is provided by the patient. No language interpreter was used.    Past Medical History:  Diagnosis Date  . Congenital absence of one kidney   . Diabetes mellitus   . Hypertension   . Thyroid disease     Patient Active Problem List   Diagnosis Date Noted  . Sepsis ([place]) 05/13/2017    Past Surgical History:  Procedure Laterality Date  . CESAREAN SECTION  2004    OB History    No data available       Home Medications    Prior to Admission medications   Medication Sig Start Date End Date Taking? Authorizing Provider  atenolol (TENORMIN) 50 MG tablet Take 50 mg by mouth daily.   Yes [provider]  clonazePAM (KLONOPIN) 0.5 MG tablet Take 0.5 mg  by mouth 3 (three) times daily as needed.   Yes [provider]  glimepiride (AMARYL) 2 MG tablet Take 2 mg by mouth daily with breakfast.   Yes [provider]  levothyroxine (SYNTHROID, LEVOTHROID) 200 MCG tablet Take 200 mcg by mouth daily before breakfast. Take with 50mg  tablet for total dose of = 250mg    Yes [provider]  losartan (COZAAR) 100 MG tablet Take 100 mg by mouth daily.   Yes [provider]  ondansetron (ZOFRAN) 4 MG tablet Take 1 tablet (4 mg total) by mouth every 6 (six) hours as needed for nausea or vomiting. 47/4/25  Yes Delora Fuel, MD  oxyCODONE (ROXICODONE) 5 MG immediate release tablet Take 1 tablet (5 mg total) by mouth every 4 (four) hours as needed for severe pain. 95/6/38  Yes Delora Fuel, MD  sertraline (ZOLOFT) 100 MG tablet Take 100 mg by mouth daily.   Yes [provider]  sitaGLIPtin (JANUVIA) 100 MG tablet Take 100 mg by mouth daily.   Yes [provider]    Family History History reviewed. No pertinent family history.  Social History Social History  Substance Use Topics  . Smoking status: Former Smoker    Quit date: 06/06/2013  . Smokeless tobacco: Never Used  . Alcohol use Yes     Allergies   Patient has no known allergies.   Review of Systems Review of Systems  Constitutional: Positive for fever. Negative for activity change.  HENT: Negative for congestion.   Eyes: Negative for visual disturbance.  Respiratory: Negative for shortness of breath.   Cardiovascular: Negative for chest pain.  Gastrointestinal: Positive for abdominal pain, nausea and vomiting. Negative for diarrhea.  Genitourinary: Positive for dysuria and flank pain. Negative for vaginal discharge and vaginal pain.  Musculoskeletal: Positive for back pain.  Skin: Negative for rash.  Allergic/Immunologic: Positive for immunocompromised state.  Neurological: Negative for headaches.   Physical Exam Updated Vital  Signs BP (!) 102/59   Pulse (!) 108   Temp (!) 100.6 F (38.1 C) (Oral)   Resp (!) 21   SpO2 93%   Physical Exam  Constitutional: No distress.  Uncomfortable appearing.   HENT:  Head: Normocephalic.  Eyes: Conjunctivae are normal.  Neck: Neck supple.  Cardiovascular: Normal rate, regular rhythm and normal heart sounds.  Exam reveals no gallop and no friction rub.   No murmur heard. Pulmonary/Chest: Effort normal and breath sounds normal. No respiratory distress. She has no wheezes. She has no rales. She exhibits no tenderness.  Abdominal: Soft. Bowel sounds are normal. She exhibits no distension and no mass. There is tenderness. There is no rebound and no guarding. No hernia.    Significant right CVA tenderness to palpation. To palpation over the left lower quadrant. No rebound or guarding.   Musculoskeletal: Normal range of motion. She exhibits no edema, tenderness or deformity.  Neurological: She is alert.  Skin: Skin is warm. No rash noted.  Psychiatric: Her behavior is normal.  Nursing note and vitals reviewed.    ED Treatments / Results  Labs (all labs ordered are listed, but only abnormal results are displayed) Labs Reviewed  COMPREHENSIVE METABOLIC PANEL - Abnormal; Notable for the following:       Result Value   Sodium 129 (*)    Chloride 91 (*)    Glucose, Bld 332 (*)    Creatinine, Ser 1.28 (*)    Calcium 8.3 (*)    Albumin 2.4 (*)    AST 64 (*)    Alkaline Phosphatase 272 (*)    Total Bilirubin 2.2 (*)    GFR calc non Af Amer 51 (*)    GFR calc Af Amer 59 (*)    Anion gap 16 (*)    All other components within normal limits  CBC WITH DIFFERENTIAL/PLATELET - Abnormal; Notable for the following:    WBC 15.1 (*)    Hemoglobin 10.9 (*)    HCT 32.9 (*)    MCV 66.6 (*)    MCH 22.1 (*)    RDW 17.3 (*)    Neutro Abs 12.5 (*)    Monocytes Absolute 1.2 (*)    All other components within normal limits  URINALYSIS, ROUTINE W REFLEX MICROSCOPIC - Abnormal;  Notable for the following:    Color, Urine AMBER (*)    APPearance CLOUDY (*)    Hgb urine dipstick LARGE (*)    Protein, ur 100 (*)    Leukocytes, UA MODERATE (*)    Bacteria, UA MANY (*)    Squamous Epithelial / LPF 0-5 (*)    All other components within normal limits  BLOOD GAS, VENOUS - Abnormal; Notable for the following:    pCO2, Ven 40.2 (*)    pO2, Ven 53.6 (*)    All other components within normal limits  CG4 I-STAT (LACTIC ACID) - Abnormal; Notable for the following:    Lactic Acid, Venous 2.03 (*)  All other components within normal limits  URINE CULTURE  CULTURE, BLOOD (ROUTINE X 2)  CULTURE, BLOOD (ROUTINE X 2)  TSH  I-STAT CG4 LACTIC ACID, ED  I-STAT CG4 LACTIC ACID, ED    EKG  EKG Interpretation None       Radiology Dg Chest 2 View  Result Date: 05/13/2017 CLINICAL DATA:  Right flank pain for 3 weeks. Worsening nausea, vomiting. EXAM: CHEST  2 VIEW COMPARISON:  05/11/2017 FINDINGS: There is no focal parenchymal opacity. There is no pleural effusion or pneumothorax. The heart and mediastinal contours are unremarkable. The osseous structures are unremarkable. IMPRESSION: No active cardiopulmonary disease. Electronically Signed   By: Kathreen Devoid   On: 05/13/2017 11:55    Procedures Procedures (including critical care time)  Medications Ordered in ED Medications  acetaminophen (TYLENOL) tablet 650 mg (650 mg Oral Given 05/13/17 1216)  ondansetron (ZOFRAN) injection 4 mg (4 mg Intravenous Given 05/13/17 1517)  sodium chloride 0.9 % bolus 1,000 mL (0 mLs Intravenous Stopped 05/13/17 1605)  morphine 4 MG/ML injection 4 mg (4 mg Intravenous Given 05/13/17 1516)  sodium chloride 0.9 % bolus 1,000 mL (1,000 mLs Intravenous New Bag/Given 05/13/17 1652)  aztreonam (AZACTAM) 1 g in dextrose 5 % 50 mL IVPB (1 g Intravenous New Bag/Given 05/13/17 1652)     Initial Impression / Assessment and Plan / ED Course  I have reviewed the triage vital signs and the nursing  notes.  Pertinent labs & imaging results that were available during my care of the patient were reviewed by me and considered in my medical decision making (see chart for details).     41 year old female with a congential absense of the left kidney, DM, and thyroid disease presenting with right flank pain, fever, nausea, and vomiting. UA with many bacteria and leukocytes. NA 129, but corrected to 135 when accounting for the patient's hyperglycemia; CBG 332. Febrile to 100.6. Tachycardic in the 100s. WBC 15.1 The patient was started on Aztreonam per the ED Antibiotics protocol for pyelonephritis in immunocompromised patients. IV fluids given. Nausea controlled with Zofran. Spoke with Dr. Nyoka Cowden, the patient's PCP after he called the emergency department to follow up on her care. He expressed concern about her TSH levels; TSH ordered and pending. Consulted the hospitalist team and spoke with Dr. Dyann Kief who will admit the patient for IV antibiotics for pyelonephritis since she has N/V. The patient appears reasonably stabilized for admission considering the current resources, flow, and capabilities available in the ED at this time, and I doubt any other [facility] requiring further screening and/or treatment in the ED prior to admission.  Final Clinical Impressions(s) / ED Diagnoses   Final diagnoses:  Pyelonephritis    New Prescriptions New Prescriptions   No medications on file     Joanne Gavel, PA-C 05/13/17 1732

## 2017-05-13 NOTE — ED Notes (Signed)
Patient is aware that we need urine, patient states she "cannot go at this time"

## 2017-05-13 NOTE — H&P (Signed)
History and Physical    Patricia Mcclain XFG:[id number] DOB: 09-25-1975 DOA: 05/13/2017  PCP: Levin Erp, MD   I have briefly reviewed patients previous medical reports in [facility].  Patient coming from: home  Chief Complaint: right flan k pain, nausea, vomiting and dysuria.   HPI: Patricia Mcclain is a 41 y/o with PMH significant for renal agenesia (only one kidney), type 2 diabetes, CKD stage 2, HTN and obesity; who presented with right flank pain, nausea, vomiting and dysuria for the last 2 weeks. Patient denies CP, hematemesis, melena, palpitations, HA's and/or focal deficit. In ED was found to be febrile, tachycardic and with elevated WBC's; also with acute on chronic renal failure and mild elevation of lactic acid (meeting sepsis criteria).  ED Course: received IV fluids resuscitation, cultures taken, PRN analgesics given and started on IV antibiotics.   Review of Systems:  All other systems reviewed and apart from HPI, are negative.  Past Medical History:  Diagnosis Date  . Congenital absence of one kidney   . Diabetes mellitus   . Hypertension   . Thyroid disease     Past Surgical History:  Procedure Laterality Date  . CESAREAN SECTION  2004    Social History  reports that she quit smoking about 3 years ago. She has never used smokeless tobacco. She reports that she drinks alcohol. She reports that she does not use drugs.  No Known Allergies  History reviewed. No pertinent family history.   Prior to Admission medications   Medication Sig Start Date End Date Taking? Authorizing Provider  atenolol (TENORMIN) 50 MG tablet Take 50 mg by mouth daily.   Yes [provider]  clonazePAM (KLONOPIN) 0.5 MG tablet Take 0.5 mg by mouth 3 (three) times daily as needed.   Yes [provider]  glimepiride (AMARYL) 2 MG tablet Take 2 mg by mouth daily with breakfast.   Yes [provider]  levothyroxine (SYNTHROID, LEVOTHROID) 200 MCG tablet Take  200 mcg by mouth daily before breakfast. Take with 50mg  tablet for total dose of = 250mg    Yes [provider]  losartan (COZAAR) 100 MG tablet Take 100 mg by mouth daily.   Yes [provider]  ondansetron (ZOFRAN) 4 MG tablet Take 1 tablet (4 mg total) by mouth every 6 (six) hours as needed for nausea or vomiting. 96/7/89  Yes Delora Fuel, MD  oxyCODONE (ROXICODONE) 5 MG immediate release tablet Take 1 tablet (5 mg total) by mouth every 4 (four) hours as needed for severe pain. 38/1/01  Yes Delora Fuel, MD  sertraline (ZOLOFT) 100 MG tablet Take 100 mg by mouth daily.   Yes [provider]  sitaGLIPtin (JANUVIA) 100 MG tablet Take 100 mg by mouth daily.   Yes [provider]    Physical Exam: Vitals:   05/13/17 1700 05/13/17 1730 05/13/17 1800 05/13/17 1809  BP: 99/75 102/61 (!) 110/94   Pulse: (!) 103 (!) 106 (!) 107   Resp: (!) 21 18 (!) 22   Temp:    98.5 F (36.9 C)  TempSrc:    Oral  SpO2: 94% 92% 90%     Constitutional: afebrile currently, no CP and reporting no difficulty breathing. Patient w/o vomiting, but feeling nauseated. Eyes: PERTLA, lids and conjunctivae normal ENMT: Mucous membranes dry on exam, no thrush. Posterior pharynx clear of any exudate or lesions. Normal dentition.  Neck: supple, no masses, no thyromegaly Respiratory: clear to auscultation bilaterally, no wheezing, no crackles. Normal  respiratory effort. No accessory muscle use.  Cardiovascular: S1 & S2 heard, regular rate and rhythm, no murmurs / rubs / gallops. Trace edema bilaterally. 2+ pedal pulses. No carotid bruits.  Abdomen: No distension, positive CVA (right flank); no masses palpated. No hepatosplenomegaly. Bowel sounds normal.  Musculoskeletal: no clubbing / cyanosis. No joint deformity upper and lower extremities. Good ROM, no contractures. Normal muscle tone.  Skin: no rashes, lesions, ulcers. No induration. Multiple tattoos seen in her back. Neurologic: CN  2-12 grossly intact. Sensation intact, DTR normal. Strength 5/5 in all 4 limbs.  Psychiatric: Normal judgment and insight. Alert and oriented x 3. Normal mood.    Labs on Admission: I have personally reviewed following labs and imaging studies  CBC:  Recent Labs Lab 05/10/17 1900 05/13/17 1250  WBC 14.4* 15.1*  NEUTROABS  --  12.5*  HGB 11.1* 10.9*  HCT 33.5* 32.9*  MCV 66.1* 66.6*  PLT 287 191   Basic Metabolic Panel:  Recent Labs Lab 05/10/17 1900 05/13/17 1250  NA 130* 129*  K 4.0 4.3  CL 93* 91*  CO2 26 22  GLUCOSE 517* 332*  BUN 12 12  CREATININE 1.12* 1.28*  CALCIUM 8.9 8.3*   Liver Function Tests:  Recent Labs Lab 05/10/17 1900 05/13/17 1250  AST 32 64*  ALT 85* 51  ALKPHOS 292* 272*  BILITOT 1.8* 2.2*  PROT 8.0 7.2  ALBUMIN 2.6* 2.4*   CBG:  Recent Labs Lab 05/11/17 0300 05/11/17 0356 05/11/17 0610 05/11/17 0649 05/13/17 1819  GLUCAP 459* 453* 388* 404* 339*   Urine analysis:    Component Value Date/Time   COLORURINE AMBER (A) 05/13/2017 1448   APPEARANCEUR CLOUDY (A) 05/13/2017 1448   LABSPEC 1.012 05/13/2017 1448   PHURINE 5.0 05/13/2017 1448   GLUCOSEU NEGATIVE 05/13/2017 1448   HGBUR LARGE (A) 05/13/2017 1448   BILIRUBINUR NEGATIVE 05/13/2017 1448   KETONESUR NEGATIVE 05/13/2017 1448   PROTEINUR 100 (A) 05/13/2017 1448   UROBILINOGEN 1.0 03/18/2011 2118   NITRITE NEGATIVE 05/13/2017 1448   LEUKOCYTESUR MODERATE (A) 05/13/2017 1448     Radiological Exams on Admission: Dg Chest 2 View  Result Date: 05/13/2017 CLINICAL DATA:  Right flank pain for 3 weeks. Worsening nausea, vomiting. EXAM: CHEST  2 VIEW COMPARISON:  05/11/2017 FINDINGS: There is no focal parenchymal opacity. There is no pleural effusion or pneumothorax. The heart and mediastinal contours are unremarkable. The osseous structures are unremarkable. IMPRESSION: No active cardiopulmonary disease. Electronically Signed   By: Kathreen Devoid   On: 05/13/2017 11:55     EKG:  No acute ischemic appreciated, sinus rhythm; slight tachycardia, normal axis.  Assessment/Plan 1-Sepsis ([place]): due to pyelonephritis -on admission patient with AKI on CKD, tachycardic and with elevated lactic acid/WBC's -started on fluid resuscitation, cultures taken and initiated on IV antibiotics -hemodynamically stable -will admit to the hospital and follow cultures results and clinical response -will use PRN analgesics and antiemetics  2-Hypothyroidism: TSH 13.041 -will resume synthroid  -apparently patient with trouble being compliant with this drugs l;ately (due to acute process, nausea and vomiting)  3-HTN (hypertension): -stable overall, even soft -will hold atenolol and cozaar -provide IVF's -follow VS  4-Congenital absence of one kidney, with CKD (chronic kidney disease), stage II: presenting with Acute on chronic renal failure ([place]): secondary to infection, use of nephrotoxic agents and dehydration. -will provide IVF's -antibiotics for infection as mentioned above -avoid/hold nephrotoxic agents -follow renal function trend   5-Type 2 diabetes mellitus with renal manifestations ([place]) -will check  A1C -hold oral hypoglycemic regimen while inpatient -use SSI and levemir -follow CBG and adjust hypoglycemic regimen as needed   6-Obesity, Class III, BMI 40-49.9 (morbid obesity) ([facility]) -low calorie diet and increase exercise discussed with patient   7-hyponatremia -due to dehydration and hyperglycemia -will provide IVF's and insulin -follow electrolytes trend   Time: 70 minutes   DVT prophylaxis: heparin  Code Status: Full code Family Communication: no family at bedside  Disposition Plan: anticipate discharge home in 48-72 hours. Consults called: none  Admission status: inpatient, LOS > 2 midnights, med-surg bed    Barton Dubois MD [facility] Pager [phone]  If 7PM-7AM, please contact night-coverage [email or web address] Password  [email or web address]  05/13/2017, 6:50 PM

## 2017-05-13 NOTE — ED Provider Notes (Signed)
Medical screening examination/treatment/procedure(s) were conducted as a shared visit with non-physician practitioner(s) and myself.  I personally evaluated the patient during the encounter.   EKG Interpretation None     41 year old female presents with right flank pain 3 weeks. Urinalysis is positive for infection here. She is febrile. We'll be treated for pyelonephritis and admitted to the hospital   Lacretia Leigh, MD 05/13/17 1550

## 2017-05-13 NOTE — ED Notes (Signed)
RN to obtain labs when starting IV

## 2017-05-13 NOTE — Progress Notes (Signed)
PHARMACY NOTE -  ANTIBIOTIC RENAL DOSE ADJUSTMENT   Request received for Pharmacy to assist with antibiotic renal dose adjustment.  Patient has been initiated on Ceftriaxone 2gm q24 for Pyelonephritis. Rocephin does not require renal dose adjustment, 2gm appropriate for pyelonephritis, sepsis criteria Current dosage is appropriate and need for further dosage adjustment appears unlikely at present. Will sign off at this time.  Please reconsult if a change in clinical status warrants re-evaluation of dosage.  Minda Ditto PharmD Pager [phone] 05/13/2017, 7:11 PM

## 2017-05-13 NOTE — ED Triage Notes (Signed)
Pt from home. Pt reports R flank pain for the past 3 weeks; has been seen for PCP for same and has outpatient imaging ordered.  Pt reports she began to have n/v recently and is concerned she is dehydrated. Pt took home pain and nausea medications prior to EMS arrival.  Fever noted in triage. Pt has 1 kidney

## 2017-05-14 LAB — BASIC METABOLIC PANEL
ANION GAP: 13 (ref 5–15)
BUN: 12 mg/dL (ref 6–20)
CHLORIDE: 96 mmol/L — AB (ref 101–111)
CO2: 23 mmol/L (ref 22–32)
CREATININE: 1.09 mg/dL — AB (ref 0.44–1.00)
Calcium: 7.9 mg/dL — ABNORMAL LOW (ref 8.9–10.3)
GFR calc non Af Amer: >60 mL/min (ref >60)
Glucose, Bld: 289 mg/dL — ABNORMAL HIGH (ref 65–99)
Potassium: 3.9 mmol/L (ref 3.5–5.1)
Sodium: 132 mmol/L — ABNORMAL LOW (ref 135–145)

## 2017-05-14 LAB — GLUCOSE, CAPILLARY
GLUCOSE-CAPILLARY: 252 mg/dL — AB (ref 65–99)
GLUCOSE-CAPILLARY: 111 mg/dL — AB (ref 65–99)
Glucose-Capillary: 259 mg/dL — ABNORMAL HIGH (ref 65–99)
Glucose-Capillary: 109 mg/dL — ABNORMAL HIGH (ref 65–99)

## 2017-05-14 LAB — LACTIC ACID, PLASMA: LACTIC ACID, VENOUS: 1.6 mmol/L (ref 0.5–1.9)

## 2017-05-14 LAB — CBC
HCT: 29.8 % — ABNORMAL LOW (ref 36.0–46.0)
HEMOGLOBIN: 9.7 g/dL — AB (ref 12.0–15.0)
MCH: 21.7 pg — AB (ref 26.0–34.0)
MCHC: 32.6 g/dL (ref 30.0–36.0)
MCV: 66.5 fL — AB (ref 78.0–100.0)
Platelets: 322 10*3/uL (ref 150–400)
RBC: 4.48 MIL/uL (ref 3.87–5.11)
RDW: 17.4 % — ABNORMAL HIGH (ref 11.5–15.5)
WBC: 13.3 10*3/uL — ABNORMAL HIGH (ref 4.0–10.5)

## 2017-05-14 LAB — HEMOGLOBIN A1C
Hgb A1c MFr Bld: 12 % — ABNORMAL HIGH (ref 4.8–5.6)
Mean Plasma Glucose: 297.7 mg/dL

## 2017-05-14 LAB — HIV ANTIBODY (ROUTINE TESTING W REFLEX): HIV Screen 4th Generation wRfx: NONREACTIVE

## 2017-05-14 MED ORDER — MAGNESIUM SULFATE IN D5W 1-5 GM/100ML-% IV SOLN
1.0000 g | Freq: Once | INTRAVENOUS | Status: AC
  Administered 2017-05-14: 1 g via INTRAVENOUS
  Filled 2017-05-14 (×2): qty 100

## 2017-05-14 MED ORDER — INSULIN DETEMIR 100 UNIT/ML ~~LOC~~ SOLN
22.0000 [IU] | Freq: Every day | SUBCUTANEOUS | Status: DC
  Administered 2017-05-14: 22 [IU] via SUBCUTANEOUS
  Filled 2017-05-14 (×2): qty 0.22

## 2017-05-14 NOTE — Progress Notes (Signed)
[facility] PROGRESS NOTE  Patricia Mcclain UUV:[id number] DOB: 09-04-1975 DOA: 05/13/2017 PCP: Levin Erp, MD  Interim summary and HPI 41 y/o with PMH significant for renal agenesia (only one kidney), type 2 diabetes, CKD stage 2, HTN and obesity; who presented with right flank pain, nausea, vomiting and dysuria for the last 2 weeks. Patient denies CP, hematemesis, melena, palpitations, HA's and/or focal deficit. In ED was found to be febrile, tachycardic and with elevated WBC's; also with acute on chronic renal failure and mild elevation of lactic acid (meeting sepsis criteria).  Assessment/Plan: 1-Sepsis ([place]): due to gram neg rods pyelonephritis -on admission patient with met sepsis criteria AKI on CKD, tachycardic and with elevated lactic acid/WBC's -continue IVF's and IV antibiotics -continue PRN antiemetics, antipyretics and analgesics -follow culture results -will advance diet   2-Hypothyroidism:  -TSH 13.041 -continue synthroid  3-HTN (hypertension): -stable overall -will continue holding atenolol and cozaar -continue IVF's  4-Congenital absence of one kidney, with CKD (chronic kidney disease), stage II: presenting with Acute on chronic renal failure ([place]): secondary to infection, continue use of nephrotoxic agents and dehydration. 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) -A1C 12.0 -continue holding oral hypoglycemic regimen while inpatient -will continue SSI and increase levemir dose as CBG still up -follow CBG and continue adjusting hypoglycemic regimen as needed   6-Obesity, Class III, BMI 40-49.9 (morbid obesity) (HCC) -low calorie diet and increase exercise discussed with patient   7-hyponatremia -improved with IVF's and better control of CBG's -continue IVF's and insulin therapy -will follow electrolytes trend    8-liver mass and right lung opacification spotted on recent CT -following radiology suggestion will recommend repeat dedicated CT scan chest with contrast in 4 weeks and/or PET-CT -no weight loss, no night sweats and no other red flags  Code Status: Full Code Family Communication: no family at bedside  Disposition Plan: will continue IVF's, continue IV antibiotics, advance diet and follow cx results.   Consultants:  None   Procedures:  See below for x-ray reports   Antibiotics:  Rocephin 10/5  HPI/Subjective: Spiked, fever, complaining of abd pain and right flank pain. No nausea, no vomiting.  Objective: Vitals:   05/14/17 1400 05/14/17 1800  BP: 110/85   Pulse: (!) 115   Resp: 20   Temp: (!) 101 F (38.3 C) 99.5 F (37.5 C)  SpO2: 95%     Intake/Output Summary (Last 24 hours) at 05/14/17 2039 Last data filed at 05/14/17 1300  Gross per 24 hour  Intake              240 ml  Output                0 ml  Net              240 ml   Filed Weights   05/13/17 1859  Weight: 125 kg (275 lb 9.2 oz)    Exam:   General: patient still spiking fever, no CP, no SOB. Complaining of abd pain and right CVA discomfort. No further nausea, no vomiting.  Cardiovascular: S1 and S2, no rubs, no gallops  Respiratory: good air movement, no wheezing, no crackles  Abdomen: positive tenderness Right flank and right lower quadrant; no guarding, positive BS.  Musculoskeletal: no cyanosis, no clubbing.  Data Reviewed: Basic Metabolic Panel:  Recent Labs Lab 05/10/17 1900 05/13/17 1250 05/13/17 1937 05/14/17 0029  NA 130*  129*  --  132*  K 4.0 4.3  --  3.9  CL 93* 91*  --  96*  CO2 26 22  --  23  GLUCOSE 517* 332*  --  289*  BUN 12 12  --  12  CREATININE 1.12* 1.28* 1.34* 1.09*  CALCIUM 8.9 8.3*  --  7.9*  MG  --   --  1.5*  --   PHOS  --   --  3.2  --    Liver Function Tests:  Recent Labs Lab 05/10/17 1900 05/13/17 1250  AST 32 64*  ALT 85* 51  ALKPHOS 292*  272*  BILITOT 1.8* 2.2*  PROT 8.0 7.2  ALBUMIN 2.6* 2.4*    Recent Labs Lab 05/10/17 1900  LIPASE 18   CBC:  Recent Labs Lab 05/10/17 1900 05/13/17 1250 05/13/17 1937 05/14/17 0029  WBC 14.4* 15.1* 13.5* 13.3*  NEUTROABS  --  12.5*  --   --   HGB 11.1* 10.9* 10.8* 9.7*  HCT 33.5* 32.9* 32.8* 29.8*  MCV 66.1* 66.6* 67.1* 66.5*  PLT 287 335 349 322   CBG:  Recent Labs Lab 05/13/17 1819 05/13/17 2141 05/14/17 0807 05/14/17 1159 05/14/17 1729  GLUCAP 339* 348* 259* 252* 111*    Recent Results (from the past 240 hour(s))  Urine culture     Status: Abnormal (Preliminary result)   Collection Time: 05/13/17  2:48 PM  Result Value Ref Range Status   Specimen Description URINE, RANDOM  Final   Special Requests NONE  Final   Culture >=100,000 COLONIES/mL GRAM NEGATIVE RODS (A)  Final   Report Status PENDING  Incomplete  Blood Culture (routine x 2)     Status: None (Preliminary result)   Collection Time: 05/13/17  4:26 PM  Result Value Ref Range Status   Specimen Description BLOOD RIGHT HAND  Final   Special Requests   Final    BOTTLES DRAWN AEROBIC AND ANAEROBIC Blood Culture adequate volume   Culture   Final    NO GROWTH < 24 HOURS Performed at [facility] [address], [place], [place] [address]    Report Status PENDING  Incomplete  Blood Culture (routine x 2)     Status: None (Preliminary result)   Collection Time: 05/13/17  4:30 PM  Result Value Ref Range Status   Specimen Description BLOOD LEFT FOREARM  Final   Special Requests   Final    BOTTLES DRAWN AEROBIC AND ANAEROBIC Blood Culture adequate volume   Culture   Final    NO GROWTH < 24 HOURS Performed at [facility], [place] [address], [place], [place] [address]    Report Status PENDING  Incomplete     Studies: Dg Chest 2 View  Result Date: 05/13/2017 CLINICAL DATA:  Right flank pain for 3 weeks. Worsening nausea, vomiting. EXAM: CHEST  2 VIEW COMPARISON:  05/11/2017 FINDINGS: There  is no focal parenchymal opacity. There is no pleural effusion or pneumothorax. The heart and mediastinal contours are unremarkable. The osseous structures are unremarkable. IMPRESSION: No active cardiopulmonary disease. Electronically Signed   By: Kathreen Devoid   On: 05/13/2017 11:55    Scheduled Meds: . heparin  5,000 Units Subcutaneous Q8H  . insulin aspart  0-15 Units Subcutaneous TID WC  . insulin detemir  22 Units Subcutaneous QHS  . levothyroxine  250 mcg Oral QAC breakfast  . sertraline  100 mg Oral Daily   Continuous Infusions: . sodium chloride 75 mL/hr at 05/14/17  1632  . cefTRIAXone (ROCEPHIN)  IV Stopped (05/13/17 2135)    Principal Problem:   Sepsis ([place]) Active Problems:   Hypothyroidism   HTN (hypertension)   Congenital absence of one kidney   CKD (chronic kidney disease), stage II   Acute on chronic renal failure (HCC)   Type 2 diabetes mellitus with renal manifestations ([place])   Obesity, Class III, BMI 40-49.9 (morbid obesity) ([place])    Time spent: 30 minutes   Barton Dubois  [facility] Pager [phone]. If 7PM-7AM, please contact night-coverage at [email or web address], password [email or web address] 05/14/2017, 8:39 PM  LOS: 1 day

## 2017-05-15 DIAGNOSIS — N12 Tubulo-interstitial nephritis, not specified as acute or chronic: Secondary | ICD-10-CM

## 2017-05-15 LAB — GLUCOSE, CAPILLARY
GLUCOSE-CAPILLARY: 111 mg/dL — AB (ref 65–99)
Glucose-Capillary: 118 mg/dL — ABNORMAL HIGH (ref 65–99)

## 2017-05-15 LAB — URINE CULTURE: Culture: >=100000 — AB

## 2017-05-15 LAB — BASIC METABOLIC PANEL
Anion gap: 9 (ref 5–15)
BUN: 9 mg/dL (ref 6–20)
CHLORIDE: 104 mmol/L (ref 101–111)
CO2: 26 mmol/L (ref 22–32)
CREATININE: 0.98 mg/dL (ref 0.44–1.00)
Calcium: 8.2 mg/dL — ABNORMAL LOW (ref 8.9–10.3)
GFR calc Af Amer: >60 mL/min (ref >60)
GFR calc non Af Amer: >60 mL/min (ref >60)
GLUCOSE: 111 mg/dL — AB (ref 65–99)
POTASSIUM: 3.8 mmol/L (ref 3.5–5.1)
Sodium: 139 mmol/L (ref 135–145)

## 2017-05-15 LAB — CBC
HEMATOCRIT: 28.5 % — AB (ref 36.0–46.0)
Hemoglobin: 9.2 g/dL — ABNORMAL LOW (ref 12.0–15.0)
MCH: 21.4 pg — AB (ref 26.0–34.0)
MCHC: 32.3 g/dL (ref 30.0–36.0)
MCV: 66.4 fL — AB (ref 78.0–100.0)
PLATELETS: 333 10*3/uL (ref 150–400)
RBC: 4.29 MIL/uL (ref 3.87–5.11)
RDW: 17.5 % — AB (ref 11.5–15.5)
WBC: 13 10*3/uL — ABNORMAL HIGH (ref 4.0–10.5)

## 2017-05-15 LAB — MAGNESIUM: Magnesium: 2 mg/dL (ref 1.7–2.4)

## 2017-05-15 MED ORDER — CEFUROXIME AXETIL 500 MG PO TABS: 500.0000 mg | ORAL_TABLET | Freq: Two times a day (BID) | ORAL | 0 refills | Status: AC

## 2017-05-15 MED ORDER — INSULIN DETEMIR 100 UNIT/ML FLEXPEN: 30.0000 [IU] | Freq: Every day | SUBCUTANEOUS | 1 refills | Status: AC

## 2017-05-15 MED ORDER — LOSARTAN POTASSIUM 100 MG PO TABS: 50.0000 mg | ORAL_TABLET | Freq: Every day | ORAL | Status: DC

## 2017-05-15 MED ORDER — ACETAMINOPHEN 325 MG PO TABS: 650.0000 mg | ORAL_TABLET | Freq: Four times a day (QID) | ORAL | 0 refills | Status: AC | PRN

## 2017-05-15 MED ORDER — INSULIN PEN NEEDLE 31G X 5 MM MISC: 0 refills | Status: AC

## 2017-05-15 NOTE — Discharge Summary (Signed)
Physician Discharge Summary  Patricia Mcclain WKG:[id number] DOB: [birth year] DOA: 05/13/2017  PCP: Levin Erp, MD  Admit date: 05/13/2017 Discharge date: 05/15/2017  Time spent: 35 minutes  Recommendations for Outpatient Follow-up:  1. Repeat BMET to follow electrolytes and renal function  2. Repeat CBC to follow WBC's trend  3. Reassess BP and adjust antihypertensive regimen as needed  4. Please follow CBG's closely and adjust hypoglycemic regimen as needed (A1C 12 and patient started on Levemir) 5. Please repeat thyroid panel in 4 weeks, adjust synthroid dose as needed. 6. -please see below for recommendation by radiology service base on abnormal findings on recent images (will need repeat CT scan and/or PET-CT   Discharge Diagnoses:  Principal Problem:   Sepsis ([place]) Active Problems:   Hypothyroidism   HTN (hypertension)   Congenital absence of one kidney   CKD (chronic kidney disease), stage II   Acute on chronic renal failure (HCC)   Type 2 diabetes mellitus with renal manifestations ([place])   Obesity, Class III, BMI 40-49.9 (morbid obesity) ([place])   Pyelonephritis   Discharge Condition: stable and improved. Discharge home with instructions to follow up with PCP in 10 days.  Diet recommendation: heart healthy, low calorie and modified carbohydrates diet   Filed Weights   05/13/17 1859  Weight: 125 kg (275 lb 9.2 oz)    History of present illness:  41 y/o with PMH significant for renal agenesia (only one kidney), type 2 diabetes, CKD stage 2, HTN and obesity; who presented with right flank pain, nausea, vomiting and dysuria for the last 2 weeks. Patient denies CP, hematemesis, melena, palpitations, HA's and/or focal deficit. In ED was found to be febrile, tachycardic and with elevated WBC's; also with acute on chronic renal failure and mild elevation of lactic acid (meeting sepsis criteria).  Hospital Course:  1-Sepsis [facility]): due to E. Coli pyelonephritis -on admission  patient with met sepsis criteria AKI on CKD, tachycardic and with elevated lactic acid/WBC's -sepsis feature resolved -just slight elevation on WBC's at discharge (repeat CBC to follow trend at follow up visit) -advise to keep herself well hydrated -microorganism proven to be pan-sensitive on cx's results. Will discharge on ceftin to complete antibiotic therapy.  -continue PRN antiemetics and use tylenol as needed for pain  -no fever over 24 hours prior to discharge -tolerating diet w/o problem   2-Hypothyroidism:  -TSH 13.041 -continue synthroid at current dose (250 Mcg daily) -please repeat thyroid panel in 4 weeks and adjust synthroid dose as needed   3-HTN (hypertension): -stable at discharge -will resume atenolol and cozaar, last one dose adjusted to 50 mg daily.  4-Congenital absence of one kidney, with CKD (chronic kidney disease), stage II: presenting with Acute on chronic renal failure ([place]): secondary to infection, continue use of nephrotoxic agents and dehydration. -renal function back to normal now -advise to maintain adequate hydration  -will recommend BMET at follow up visit to check renal function trend  -will resume cozaar at half dose at discharge.  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) -A1C 12.0 -will discharge on levemir and januvia -patient advise to follow modified carbohydrates diet and to keep herself well hydratedtinue SSI -close follow up as an outpatient by PCP for further adjustments on her hypoglycemic regimen   6-Obesity, Class III, BMI 40-49.9 (morbid obesity) ([place]) -low calorie diet and increase exercise discussed with patient   7-hyponatremia -improved with IVF's and better control of CBG's -advise to maintain adequate hydration  -will recommend BMET at  follow to reassess electrolytes  8-liver mass and right lung opacification spotted on recent CT -following radiology suggestion will recommend repeat dedicated CT scan  chest with contrast in 4 weeks and/or PET-CT -no weight loss, no night sweats and no other red flags  Procedures:  See below for x-ray report   Consultations:  None   Discharge Exam: Vitals:   05/14/17 2218 05/15/17 0607  BP: 110/74 108/66  Pulse: (!) 110 (!) 112  Resp: 20 20  Temp: 99.8 F (37.7 C) 98.3 F (36.8 C)  SpO2: 94% 98%    General: Patient afebrile now for 24 hours. No CP, no SOB. Significant improvement in her flank pain. No further nausea, no vomiting.  Cardiovascular: S1 and S2, no rubs, no gallops  Respiratory: good air movement, no wheezing, no crackles  Abdomen: very little tenderness on her Right flank and right lower quadrant with palpation;  no guarding, positive BS and no distension.  Musculoskeletal: no cyanosis, no clubbing.   Discharge Instructions   Discharge Instructions    Diet - low sodium heart healthy    Complete by:  As directed    Diet Carb Modified    Complete by:  As directed    Discharge instructions    Complete by:  As directed    Arrange follow up with PCP in 10 days Keep yourself well hydrated  Avoid the use of NSAID's Follow a low calorie and modified carbohydrates diet     Current Discharge Medication List    START taking these medications   Details  acetaminophen (TYLENOL) 325 MG tablet Take 2 tablets (650 mg total) by mouth every 6 (six) hours as needed (for pain, fever above 101 and Headache.). Qty: 50 tablet, Refills: 0    cefUROXime (CEFTIN) 500 MG tablet Take 1 tablet (500 mg total) by mouth 2 (two) times daily. Qty: 20 tablet, Refills: 0    insulin detemir (LEVEMIR) 100 unit/ml SOLN Inject 0.3 mLs (30 Units total) into the skin at bedtime. Qty: 10 mL, Refills: 1    Insulin Pen Needle 31G X 5 MM MISC Use as needed to inject insulin daily as instructed Qty: 100 each, Refills: 0      CONTINUE these medications which have CHANGED   Details  losartan (COZAAR) 100 MG tablet Take 0.5 tablets (50 mg total) by  mouth daily.      CONTINUE these medications which have NOT CHANGED   Details  atenolol (TENORMIN) 50 MG tablet Take 50 mg by mouth daily.    clonazePAM (KLONOPIN) 0.5 MG tablet Take 0.5 mg by mouth 3 (three) times daily as needed.    levothyroxine (SYNTHROID, LEVOTHROID) 200 MCG tablet Take 200 mcg by mouth daily before breakfast. Take with 94m tablet for total dose of = 2549m   ondansetron (ZOFRAN) 4 MG tablet Take 1 tablet (4 mg total) by mouth every 6 (six) hours as needed for nausea or vomiting. Qty: 12 tablet, Refills: 0    sertraline (ZOLOFT) 100 MG tablet Take 100 mg by mouth daily.    sitaGLIPtin (JANUVIA) 100 MG tablet Take 100 mg by mouth daily.      STOP taking these medications     glimepiride (AMARYL) 2 MG tablet      oxyCODONE (ROXICODONE) 5 MG immediate release tablet        No Known Allergies Follow-up Information    GrLevin ErpMD. Schedule an appointment as soon as possible for a visit in 10 day(s).   Specialty:  Internal Medicine Contact information: [place], [place] [address] [phone]           The results of significant diagnostics from this hospitalization (including imaging, microbiology, ancillary and laboratory) are listed below for reference.    Significant Diagnostic Studies: Ct Abdomen Pelvis Wo Contrast  Result Date: 05/11/2017 CLINICAL DATA:  Productive cough with mild shortness-of-breath and right-sided pain with nausea, vomiting and diarrhea 2 weeks. Dysuria and frequent urination. EXAM: CT CHEST, ABDOMEN AND PELVIS WITHOUT CONTRAST TECHNIQUE: Multidetector CT imaging of the chest, abdomen and pelvis was performed following the standard protocol without IV contrast. COMPARISON:  Chest CT 10/22/2013 and abdominopelvic CT 02/11/2011 FINDINGS: CT CHEST FINDINGS Cardiovascular: Heart is normal size. Vascular structures are unremarkable. Mediastinum/Nodes: No evidence of mediastinal or hilar adenopathy.  Remaining mediastinal structures are within normal. Lungs/Pleura: Lungs are adequately inflated and demonstrate focal airspace opacification of the posterior right lower lobe which may be due to atelectasis, infection and less likely neoplasm. 3 mm nodule over the left apex unchanged from 2015 small nodule over the lateral left upper lobe unchanged from 2015. Minimal scarring over the lingula. Airways are within normal. Musculoskeletal: Within normal. CT ABDOMEN PELVIS FINDINGS Hepatobiliary: Numerous hypodense masses throughout the liver new compared to the previous exams. Gallbladder and biliary tree are within normal. Pancreas: Within normal. Spleen: Within normal. Adrenals/Urinary Tract: Adrenal glands are normal. No left-sided kidney. Right kidney demonstrates a slightly abnormal orientation and more lateral position. Mild prominence of the right intrarenal collecting system although no renal stones. Mild prominence of the right ureter without ureteral calculi. Bladder is within normal. Stomach/Bowel: Stomach and small bowel are within normal. Appendix is normal. Colon is within normal. Vascular/Lymphatic: Vascular structures are within normal. There are a few small periaortic lymph nodes as well as several small lymph nodes over the upper abdomen the region of the gastrohepatic ligament and celiac axis. Reproductive: 4.1 cm right ovarian cyst. Left ovary and uterus are unremarkable. Other: No free fluid or focal inflammatory change. Musculoskeletal: Within normal. IMPRESSION: Mild focal airspace opacification over the periphery of the right lower lobe which may be due to atelectasis or infection and less likely neoplasm. Recommend follow-up chest CT 3-4 weeks. Two subcentimeter left lung nodules stable since 2015. Numerous hypodense masses throughout the liver new compared to the previous exam is concerning for metastatic disease. Consider PET-CT versus tissue sampling. Mild increased number of small lymph  nodes over the upper abdomen as described. Absent left kidney. Minimal prominence of the right intrarenal collecting system and ureter without stones or mass identified. 4.1 cm right ovarian cyst. Electronically Signed   By: Marin Olp M.D.   On: 05/11/2017 00:31   Dg Chest 2 View  Result Date: 05/13/2017 CLINICAL DATA:  Right flank pain for 3 weeks. Worsening nausea, vomiting. EXAM: CHEST  2 VIEW COMPARISON:  05/11/2017 FINDINGS: There is no focal parenchymal opacity. There is no pleural effusion or pneumothorax. The heart and mediastinal contours are unremarkable. The osseous structures are unremarkable. IMPRESSION: No active cardiopulmonary disease. Electronically Signed   By: Kathreen Devoid   On: 05/13/2017 11:55   Ct Chest Wo Contrast  Result Date: 05/11/2017 CLINICAL DATA:  Productive cough with mild shortness-of-breath and right-sided pain with nausea, vomiting and diarrhea 2 weeks. Dysuria and frequent urination. EXAM: CT CHEST, ABDOMEN AND PELVIS WITHOUT CONTRAST TECHNIQUE: Multidetector CT imaging of the chest, abdomen and pelvis was performed following the standard protocol without IV contrast. COMPARISON:  Chest CT  10/22/2013 and abdominopelvic CT 02/11/2011 FINDINGS: CT CHEST FINDINGS Cardiovascular: Heart is normal size. Vascular structures are unremarkable. Mediastinum/Nodes: No evidence of mediastinal or hilar adenopathy. Remaining mediastinal structures are within normal. Lungs/Pleura: Lungs are adequately inflated and demonstrate focal airspace opacification of the posterior right lower lobe which may be due to atelectasis, infection and less likely neoplasm. 3 mm nodule over the left apex unchanged from 2015 small nodule over the lateral left upper lobe unchanged from 2015. Minimal scarring over the lingula. Airways are within normal. Musculoskeletal: Within normal. CT ABDOMEN PELVIS FINDINGS Hepatobiliary: Numerous hypodense masses throughout the liver new compared to the previous exams.  Gallbladder and biliary tree are within normal. Pancreas: Within normal. Spleen: Within normal. Adrenals/Urinary Tract: Adrenal glands are normal. No left-sided kidney. Right kidney demonstrates a slightly abnormal orientation and more lateral position. Mild prominence of the right intrarenal collecting system although no renal stones. Mild prominence of the right ureter without ureteral calculi. Bladder is within normal. Stomach/Bowel: Stomach and small bowel are within normal. Appendix is normal. Colon is within normal. Vascular/Lymphatic: Vascular structures are within normal. There are a few small periaortic lymph nodes as well as several small lymph nodes over the upper abdomen the region of the gastrohepatic ligament and celiac axis. Reproductive: 4.1 cm right ovarian cyst. Left ovary and uterus are unremarkable. Other: No free fluid or focal inflammatory change. Musculoskeletal: Within normal. IMPRESSION: Mild focal airspace opacification over the periphery of the right lower lobe which may be due to atelectasis or infection and less likely neoplasm. Recommend follow-up chest CT 3-4 weeks. Two subcentimeter left lung nodules stable since 2015. Numerous hypodense masses throughout the liver new compared to the previous exam is concerning for metastatic disease. Consider PET-CT versus tissue sampling. Mild increased number of small lymph nodes over the upper abdomen as described. Absent left kidney. Minimal prominence of the right intrarenal collecting system and ureter without stones or mass identified. 4.1 cm right ovarian cyst. Electronically Signed   By: Marin Olp M.D.   On: 05/11/2017 00:31   US Abdomen Limited Ruq  Result Date: 05/10/2017 CLINICAL DATA:  Abdominal pain with nausea and vomiting 2 weeks. EXAM: ULTRASOUND ABDOMEN LIMITED RIGHT UPPER QUADRANT COMPARISON:  Ultrasound 08/04/2016 and CT 02/11/2011 FINDINGS: Gallbladder: Gallbladder slightly contracted without stones or sludge. No  adjacent free fluid. Negative sonographic Murphy's sign. Borderline wall thickening measuring 3.2 mm. Common bile duct: Diameter: 3.7 mm. Liver: Numerous hypoechoic liver masses some which have a target appearance as this finding is new compared to the prior exams and suggest metastatic disease. Portal vein is patent on color Doppler imaging with normal direction of blood flow towards the liver. IMPRESSION: No acute gallbladder disease. Numerous hypodense liver masses new since the previous exams concerning for metastatic disease. Electronically Signed   By: Marin Olp M.D.   On: 05/10/2017 22:57    Microbiology: Recent Results (from the past 240 hour(s))  Urine culture     Status: Abnormal   Collection Time: 05/13/17  2:48 PM  Result Value Ref Range Status   Specimen Description URINE, RANDOM  Final   Special Requests NONE  Final   Culture >=100,000 COLONIES/mL ESCHERICHIA COLI (A)  Final   Report Status 05/15/2017 FINAL  Final   Organism ID, Bacteria ESCHERICHIA COLI (A)  Final      Susceptibility   Escherichia coli - MIC*    AMPICILLIN <=2 SENSITIVE Sensitive     CEFAZOLIN <=4 SENSITIVE Sensitive     CEFTRIAXONE <=1  SENSITIVE Sensitive     CIPROFLOXACIN 1 SENSITIVE Sensitive     GENTAMICIN <=1 SENSITIVE Sensitive     IMIPENEM <=0.25 SENSITIVE Sensitive     NITROFURANTOIN <=16 SENSITIVE Sensitive     TRIMETH/SULFA <=20 SENSITIVE Sensitive     AMPICILLIN/SULBACTAM <=2 SENSITIVE Sensitive     PIP/TAZO <=4 SENSITIVE Sensitive     Extended ESBL NEGATIVE Sensitive     * >=100,000 COLONIES/mL ESCHERICHIA COLI  Blood Culture (routine x 2)     Status: None (Preliminary result)   Collection Time: 05/13/17  4:26 PM  Result Value Ref Range Status   Specimen Description BLOOD RIGHT HAND  Final   Special Requests   Final    BOTTLES DRAWN AEROBIC AND ANAEROBIC Blood Culture adequate volume   Culture   Final    NO GROWTH < 24 HOURS Performed at [facility], [address],  [place], [place] [address]    Report Status PENDING  Incomplete  Blood Culture (routine x 2)     Status: None (Preliminary result)   Collection Time: 05/13/17  4:30 PM  Result Value Ref Range Status   Specimen Description BLOOD LEFT FOREARM  Final   Special Requests   Final    BOTTLES DRAWN AEROBIC AND ANAEROBIC Blood Culture adequate volume   Culture   Final    NO GROWTH < 24 HOURS Performed at [facility], [place] [address] [place], [place] [address]    Report Status PENDING  Incomplete     Labs: Basic Metabolic Panel:  Recent Labs Lab 05/10/17 1900 05/13/17 1250 05/13/17 1937 05/14/17 0029 05/15/17 0401  NA 130* 129*  --  132* 139  K 4.0 4.3  --  3.9 3.8  CL 93* 91*  --  96* 104  CO2 26 22  --  23 26  GLUCOSE 517* 332*  --  289* 111*  BUN 12 12  --  12 9  CREATININE 1.12* 1.28* 1.34* 1.09* 0.98  CALCIUM 8.9 8.3*  --  7.9* 8.2*  MG  --   --  1.5*  --  2.0  PHOS  --   --  3.2  --   --    Liver Function Tests:  Recent Labs Lab 05/10/17 1900 05/13/17 1250  AST 32 64*  ALT 85* 51  ALKPHOS 292* 272*  BILITOT 1.8* 2.2*  PROT 8.0 7.2  ALBUMIN 2.6* 2.4*    Recent Labs Lab 05/10/17 1900  LIPASE 18   CBC:  Recent Labs Lab 05/10/17 1900 05/13/17 1250 05/13/17 1937 05/14/17 0029 05/15/17 0401  WBC 14.4* 15.1* 13.5* 13.3* 13.0*  NEUTROABS  --  12.5*  --   --   --   HGB 11.1* 10.9* 10.8* 9.7* 9.2*  HCT 33.5* 32.9* 32.8* 29.8* 28.5*  MCV 66.1* 66.6* 67.1* 66.5* 66.4*  PLT 287 335 349 322 333   CBG:  Recent Labs Lab 05/14/17 1159 05/14/17 1729 05/14/17 2021 05/15/17 0750 05/15/17 1133  GLUCAP 252* 111* 109* 118* 111*    Signed:  Barton Dubois MD.  [facility] 05/15/2017, 11:35 AM

## 2017-05-18 LAB — CULTURE, BLOOD (ROUTINE X 2)
CULTURE: NO GROWTH
Culture: NO GROWTH
Special Requests: ADEQUATE
Special Requests: ADEQUATE

## 2017-05-21 ENCOUNTER — Encounter (HOSPITAL_COMMUNITY): Payer: Self-pay

## 2017-06-06 ENCOUNTER — Emergency Department (HOSPITAL_COMMUNITY): Payer: Medicaid Other

## 2017-06-06 ENCOUNTER — Encounter (HOSPITAL_COMMUNITY): Payer: Self-pay | Admitting: *Deleted

## 2017-06-06 ENCOUNTER — Emergency Department (HOSPITAL_COMMUNITY)
Admission: EM | Admit: 2017-06-06 | Discharge: 2017-06-07 | Disposition: A | Discharge status: Home / Self Care | Payer: Medicaid Other | Attending: Emergency Medicine | Admitting: Emergency Medicine

## 2017-06-06 DIAGNOSIS — Z79899 Other long term (current) drug therapy: Secondary | ICD-10-CM | POA: Diagnosis not present

## 2017-06-06 DIAGNOSIS — R160 Hepatomegaly, not elsewhere classified: Secondary | ICD-10-CM | POA: Diagnosis not present

## 2017-06-06 DIAGNOSIS — N182 Chronic kidney disease, stage 2 (mild): Secondary | ICD-10-CM | POA: Insufficient documentation

## 2017-06-06 DIAGNOSIS — E1122 Type 2 diabetes mellitus with diabetic chronic kidney disease: Secondary | ICD-10-CM | POA: Insufficient documentation

## 2017-06-06 DIAGNOSIS — Z87891 Personal history of nicotine dependence: Secondary | ICD-10-CM | POA: Diagnosis not present

## 2017-06-06 DIAGNOSIS — Z794 Long term (current) use of insulin: Secondary | ICD-10-CM | POA: Diagnosis not present

## 2017-06-06 DIAGNOSIS — I129 Hypertensive chronic kidney disease with stage 1 through stage 4 chronic kidney disease, or unspecified chronic kidney disease: Secondary | ICD-10-CM | POA: Insufficient documentation

## 2017-06-06 DIAGNOSIS — R1011 Right upper quadrant pain: Secondary | ICD-10-CM | POA: Diagnosis present

## 2017-06-06 DIAGNOSIS — R197 Diarrhea, unspecified: Secondary | ICD-10-CM | POA: Insufficient documentation

## 2017-06-06 DIAGNOSIS — R112 Nausea with vomiting, unspecified: Secondary | ICD-10-CM | POA: Insufficient documentation

## 2017-06-06 DIAGNOSIS — R17 Unspecified jaundice: Secondary | ICD-10-CM

## 2017-06-06 DIAGNOSIS — E039 Hypothyroidism, unspecified: Secondary | ICD-10-CM | POA: Insufficient documentation

## 2017-06-06 LAB — CBC WITH DIFFERENTIAL/PLATELET
BASOS PCT: 1 %
Basophils Absolute: 0.1 10*3/uL (ref 0.0–0.1)
EOS ABS: 0.4 10*3/uL (ref 0.0–0.7)
EOS PCT: 4 %
HCT: 39.6 % (ref 36.0–46.0)
HEMOGLOBIN: 12.4 g/dL (ref 12.0–15.0)
LYMPHS PCT: 9 %
Lymphs Abs: 0.9 10*3/uL (ref 0.7–4.0)
MCH: 23.8 pg — AB (ref 26.0–34.0)
MCHC: 31.3 g/dL (ref 30.0–36.0)
MCV: 76.2 fL — AB (ref 78.0–100.0)
Monocytes Absolute: 1.3 10*3/uL — ABNORMAL HIGH (ref 0.1–1.0)
Monocytes Relative: 14 %
NEUTROS ABS: 6.9 10*3/uL (ref 1.7–7.7)
NEUTROS PCT: 72 %
Platelets: 270 10*3/uL (ref 150–400)
RBC: 5.2 MIL/uL — ABNORMAL HIGH (ref 3.87–5.11)
RDW: 27.1 % — ABNORMAL HIGH (ref 11.5–15.5)
WBC: 9.6 10*3/uL (ref 4.0–10.5)

## 2017-06-06 LAB — PROTIME-INR
INR: 1.15
PROTHROMBIN TIME: 14.6 s (ref 11.4–15.2)

## 2017-06-06 LAB — COMPREHENSIVE METABOLIC PANEL
ALK PHOS: 319 U/L — AB (ref 38–126)
ALT: 40 U/L (ref 14–54)
ANION GAP: 17 — AB (ref 5–15)
AST: 102 U/L — ABNORMAL HIGH (ref 15–41)
Albumin: 2.2 g/dL — ABNORMAL LOW (ref 3.5–5.0)
BILIRUBIN TOTAL: 8.2 mg/dL — AB (ref 0.3–1.2)
BUN: 13 mg/dL (ref 6–20)
CALCIUM: 9.1 mg/dL (ref 8.9–10.3)
CO2: 21 mmol/L — ABNORMAL LOW (ref 22–32)
CREATININE: 1.08 mg/dL — AB (ref 0.44–1.00)
Chloride: 93 mmol/L — ABNORMAL LOW (ref 101–111)
Glucose, Bld: 115 mg/dL — ABNORMAL HIGH (ref 65–99)
Potassium: 4 mmol/L (ref 3.5–5.1)
SODIUM: 131 mmol/L — AB (ref 135–145)
TOTAL PROTEIN: 6.1 g/dL — AB (ref 6.5–8.1)

## 2017-06-06 LAB — AMMONIA: AMMONIA: 42 umol/L — AB (ref 9–35)

## 2017-06-06 LAB — LIPASE, BLOOD: LIPASE: 18 U/L (ref 11–51)

## 2017-06-06 LAB — BILIRUBIN, DIRECT: Bilirubin, Direct: 4.8 mg/dL — ABNORMAL HIGH (ref 0.1–0.5)

## 2017-06-06 MED ORDER — IOPAMIDOL (ISOVUE-300) INJECTION 61%
INTRAVENOUS | Status: AC
  Administered 2017-06-06: 100 mL
  Filled 2017-06-06: qty 100

## 2017-06-06 MED ORDER — ONDANSETRON HCL 4 MG/2ML IJ SOLN
4.0000 mg | Freq: Once | INTRAMUSCULAR | Status: AC
  Administered 2017-06-06: 4 mg via INTRAVENOUS
  Filled 2017-06-06: qty 2

## 2017-06-06 MED ORDER — MORPHINE SULFATE (PF) 4 MG/ML IV SOLN
8.0000 mg | Freq: Once | INTRAVENOUS | Status: AC
  Administered 2017-06-06: 8 mg via INTRAVENOUS
  Filled 2017-06-06: qty 2

## 2017-06-06 MED ORDER — METOCLOPRAMIDE HCL 5 MG/ML IJ SOLN
10.0000 mg | Freq: Once | INTRAMUSCULAR | Status: AC
  Administered 2017-06-06: 10 mg via INTRAVENOUS
  Filled 2017-06-06: qty 2

## 2017-06-06 MED ORDER — SODIUM CHLORIDE 0.9 % IV BOLUS (SEPSIS)
1000.0000 mL | Freq: Once | INTRAVENOUS | Status: AC
  Administered 2017-06-06: 1000 mL via INTRAVENOUS

## 2017-06-06 MED ORDER — MORPHINE SULFATE (PF) 4 MG/ML IV SOLN
4.0000 mg | Freq: Once | INTRAVENOUS | Status: AC
  Administered 2017-06-06: 4 mg via INTRAVENOUS
  Filled 2017-06-06: qty 1

## 2017-06-06 NOTE — ED Notes (Signed)
Pt to CT at this time.

## 2017-06-06 NOTE — ED Triage Notes (Addendum)
Pt to ED via [facility] from home with weakness and jaundice today. Has one kidney with recent infection. Pt was found in her tub by EMS. Per EMS pt was just too weak to get out of the tub. Pt complains of nausea and vomiting.

## 2017-06-06 NOTE — ED Notes (Signed)
Asked PT for urine, PT stated she did not need to go to the bathroom.

## 2017-06-06 NOTE — ED Provider Notes (Signed)
[facility] EMERGENCY DEPARTMENT Provider Note CSN: [id number] Arrival date & time: 06/06/17  1824 History   Chief Complaint Chief Complaint  Patient presents with  . Abdominal Pain  . Jaundice   HPI Patricia Mcclain is a 41 y.o. female.  The history is provided by the patient and a parent.  Abdominal Pain   This is a recurrent problem. The current episode started more than 1 week ago. The problem occurs constantly. The problem has not changed since onset.The pain is associated with an unknown factor. The pain is located in the generalized abdominal region and RUQ. The quality of the pain is dull and cramping. The pain is moderate. Associated symptoms include diarrhea, nausea and vomiting. Pertinent negatives include fever, dysuria, hematuria and arthralgias. Nothing aggravates the symptoms. Nothing relieves the symptoms. Past workup includes CT scan.   The patient recently presented to [facility] were she was admitted for pyelonephritis. The patient states that she was told she has some masses on her liver but was not told what they could be, patient state no one made any mention of possible cancer. She states that since she was discharged she has continued to feel poorly with poor appetite, nausea, and vomiting, and abdominal pain.   Past Medical History:  Diagnosis Date  . Congenital absence of one kidney   . Diabetes mellitus   . Hypertension   . Thyroid disease    Patient Active Problem List   Diagnosis Date Noted  . Pyelonephritis   . Sepsis ([place]) 05/13/2017  . Hypothyroidism 05/13/2017  . HTN (hypertension) 05/13/2017  . Congenital absence of one kidney 05/13/2017  . CKD (chronic kidney disease), stage II 05/13/2017  . Acute on chronic renal failure ([place]) 05/13/2017  . Type 2 diabetes mellitus with renal manifestations ([place]) 05/13/2017  . Obesity, Class III, BMI 40-49.9 (morbid obesity) ([place]) 05/13/2017   Past Surgical History:  Procedure Laterality  Date  . CESAREAN SECTION  2004   OB History    No data available     Home Medications    Prior to Admission medications   Medication Sig Start Date End Date Taking? Authorizing Provider  acetaminophen (TYLENOL) 325 MG tablet Take 2 tablets (650 mg total) by mouth every 6 (six) hours as needed (for pain, fever above 101 and Headache.). 05/15/17  Yes Barton Dubois, MD  atenolol (TENORMIN) 50 MG tablet Take 50 mg by mouth daily.   Yes [provider]  clonazePAM (KLONOPIN) 0.5 MG tablet Take 0.5 mg by mouth 3 (three) times daily as needed.   Yes [provider]  insulin detemir (LEVEMIR) 100 unit/ml SOLN Inject 0.3 mLs (30 Units total) into the skin at bedtime. Patient taking differently: Inject 30 Units into the skin at bedtime as needed (low sugar).  05/15/17  Yes Barton Dubois, MD  levothyroxine (SYNTHROID, LEVOTHROID) 200 MCG tablet Take 200 mcg by mouth daily before breakfast. Take with 23m tablet for total dose of = 2559m  Yes [provider]  losartan (COZAAR) 100 MG tablet Take 0.5 tablets (50 mg total) by mouth daily. 05/16/17  Yes MaBarton DuboisMD  ondansetron (ZOFRAN) 4 MG tablet Take 1 tablet (4 mg total) by mouth every 6 (six) hours as needed for nausea or vomiting. 1071/6/96Yes GlDelora FuelMD  sertraline (ZOLOFT) 100 MG tablet Take 200 mg by mouth daily.    Yes [provider]  sitaGLIPtin (JANUVIA) 100 MG tablet Take 100 mg by mouth daily.  Yes [provider]  Insulin Pen Needle 31G X 5 MM MISC Use as needed to inject insulin daily as instructed 05/15/17   Barton Dubois, MD   Family History No family history on file.  Social History Social History  Substance Use Topics  . Smoking status: Former Smoker    Quit date: 06/06/2013  . Smokeless tobacco: Never Used  . Alcohol use Yes   Allergies   Patient has no known allergies.  Review of Systems Review of Systems  Constitutional: Positive for fatigue. Negative for  chills and fever.  HENT: Negative for ear pain and sore throat.   Eyes: Negative for pain and visual disturbance.  Respiratory: Negative for cough and shortness of breath.   Cardiovascular: Negative for chest pain and palpitations.  Gastrointestinal: Positive for abdominal pain, diarrhea, nausea and vomiting.  Genitourinary: Negative for dysuria and hematuria.  Musculoskeletal: Negative for arthralgias and back pain.  Skin: Negative for color change and rash.  Neurological: Positive for weakness. Negative for seizures and syncope.  All other systems reviewed and are negative.  Physical Exam Updated Vital Signs BP 117/82 (BP Location: Right Arm)   Pulse (!) 118   Temp 97.9 F (36.6 C) (Oral)   Resp 18   SpO2 96%   Physical Exam  Constitutional: She is oriented to person, place, and time. She appears well-developed. No distress.  HENT:  Head: Normocephalic and atraumatic.  Eyes: Pupils are equal, round, and reactive to light. Conjunctivae and EOM are normal. Scleral icterus is present.  Neck: Neck supple.  Cardiovascular: Normal rate and regular rhythm.   No murmur heard. Pulmonary/Chest: Effort normal and breath sounds normal. No respiratory distress.  Abdominal: Soft. She exhibits distension and mass. There is tenderness.  Musculoskeletal: She exhibits no edema.  Neurological: She is alert and oriented to person, place, and time.  Skin: Skin is warm and dry.  Psychiatric: She has a normal mood and affect.  Nursing note and vitals reviewed.  ED Treatments / Results  Labs (all labs ordered are listed, but only abnormal results are displayed) Labs Reviewed  CBC WITH DIFFERENTIAL/PLATELET - Abnormal; Notable for the following:       Result Value   RBC 5.20 (*)    MCV 76.2 (*)    MCH 23.8 (*)    RDW 27.1 (*)    Monocytes Absolute 1.3 (*)    All other components within normal limits  COMPREHENSIVE METABOLIC PANEL - Abnormal; Notable for the following:    Sodium 131 (*)     Chloride 93 (*)    CO2 21 (*)    Glucose, Bld 115 (*)    Creatinine, Ser 1.08 (*)    Total Protein 6.1 (*)    Albumin 2.2 (*)    AST 102 (*)    Alkaline Phosphatase 319 (*)    Total Bilirubin 8.2 (*)    Anion gap 17 (*)    All other components within normal limits  AMMONIA - Abnormal; Notable for the following:    Ammonia 42 (*)    All other components within normal limits  BILIRUBIN, DIRECT - Abnormal; Notable for the following:    Bilirubin, Direct 4.8 (*)    All other components within normal limits  LIPASE, BLOOD  PROTIME-INR   EKG  EKG Interpretation None      Radiology No results found.  Procedures Procedures (including critical care time)  Medications Ordered in ED Medications - No data to display  Initial Impression / Assessment  and Plan / ED Course  I have reviewed the triage vital signs and the nursing notes.  Pertinent labs & imaging results that were available during my care of the patient were reviewed by me and considered in my medical decision making (see chart for details).  ROSEMARIE GALVIS is a 41 y.o. female with PMH DM and HTN who present with abdominal pain, nausea, vomiting, and diarrhea that has been present for the past month.   On my initial assessment the patient appears to be resting comfortable in bed. She has sclera icterus. Abdomen is protuberant and she has diffuse tenderness throughout.   Lab studies and imaging ordered:  CBC: without leukocytosis or anemia, normal plt CMP: with mild hyponatremia 131, normal K, creatine 1.08 appears to be at baseline, albumin 2.2,  AST 101, ALT 40 ALK 319, T bili 8.2  Lipase normal, normal INR  CT abd/pelvis revealed: Innumerable hepatic metastases, Associated prominent upper abdominal lymph nodes, including a 16 mm short axis node along the porta hepatis, suspicious for nodal metastasis. Mixed cystic/solid lesion in the posterior right lower lobe, incompletely visualized. The appearance on prior CT  chest at least raises concern for possible primary bronchogenic neoplasm.  CT scan without evidence of appendicitis,SBO, nephrolithiasis, pancreatitis, cholecystitis, perforated bowel or ulcer, Diverticulosis/itis, Ischemic Mesentery, Inflammatory Bowel Disease, Strangulated/Incarcerated Hernia, or other abdominal emergency as this would be inconsistent with history and physical, low risk, other diagnosis much more likely.   At this time the patient's symptoms are mostly likely related to her undiagnosed metastatic cancer. Discussed with the patient and her mother at bedside the most likely diagnosis and my concerns for metastatic cancer and the importance of following up with her PCP to arrange for further diagnostic testing including possible tumor biopsy and/or pet scan.   Dc home, return precautions given, follow up with primary care provider, patient in agreement with plan. Given Rx for reglan and oxycodone.  The plan for this patient was discussed with Dr. Wilson Singer, who voiced agreement and who oversaw evaluation and treatment of this patient.   Final Clinical Impressions(s) / ED Diagnoses   Final diagnoses:  Hyperbilirubinemia  Jaundice  Right upper quadrant abdominal pain  Liver masses  Nausea and vomiting, intractability of vomiting not specified, unspecified vomiting type   New Prescriptions New Prescriptions   No medications on file     Fenton Foy, MD 06/07/17 1511    Virgel Manifold, MD 06/21/17 1109

## 2017-06-07 ENCOUNTER — Telehealth: Payer: Self-pay | Admitting: *Deleted

## 2017-06-07 ENCOUNTER — Encounter (HOSPITAL_COMMUNITY): Payer: Self-pay | Admitting: Emergency Medicine

## 2017-06-07 DIAGNOSIS — E860 Dehydration: Secondary | ICD-10-CM | POA: Diagnosis present

## 2017-06-07 DIAGNOSIS — Z794 Long term (current) use of insulin: Secondary | ICD-10-CM

## 2017-06-07 DIAGNOSIS — Q600 Renal agenesis, unilateral: Secondary | ICD-10-CM

## 2017-06-07 DIAGNOSIS — Z79899 Other long term (current) drug therapy: Secondary | ICD-10-CM

## 2017-06-07 DIAGNOSIS — I129 Hypertensive chronic kidney disease with stage 1 through stage 4 chronic kidney disease, or unspecified chronic kidney disease: Secondary | ICD-10-CM | POA: Diagnosis present

## 2017-06-07 DIAGNOSIS — E039 Hypothyroidism, unspecified: Secondary | ICD-10-CM | POA: Diagnosis present

## 2017-06-07 DIAGNOSIS — K5909 Other constipation: Secondary | ICD-10-CM | POA: Diagnosis present

## 2017-06-07 DIAGNOSIS — Z6841 Body Mass Index (BMI) 40.0 and over, adult: Secondary | ICD-10-CM

## 2017-06-07 DIAGNOSIS — C787 Secondary malignant neoplasm of liver and intrahepatic bile duct: Principal | ICD-10-CM | POA: Diagnosis present

## 2017-06-07 DIAGNOSIS — G893 Neoplasm related pain (acute) (chronic): Secondary | ICD-10-CM | POA: Diagnosis present

## 2017-06-07 DIAGNOSIS — F329 Major depressive disorder, single episode, unspecified: Secondary | ICD-10-CM | POA: Diagnosis present

## 2017-06-07 DIAGNOSIS — E1122 Type 2 diabetes mellitus with diabetic chronic kidney disease: Secondary | ICD-10-CM | POA: Diagnosis present

## 2017-06-07 DIAGNOSIS — F419 Anxiety disorder, unspecified: Secondary | ICD-10-CM | POA: Diagnosis present

## 2017-06-07 DIAGNOSIS — Z87891 Personal history of nicotine dependence: Secondary | ICD-10-CM

## 2017-06-07 DIAGNOSIS — N179 Acute kidney failure, unspecified: Secondary | ICD-10-CM | POA: Diagnosis present

## 2017-06-07 DIAGNOSIS — N182 Chronic kidney disease, stage 2 (mild): Secondary | ICD-10-CM | POA: Diagnosis present

## 2017-06-07 MED ORDER — METOCLOPRAMIDE HCL 10 MG PO TABS: 10.0000 mg | ORAL_TABLET | Freq: Four times a day (QID) | ORAL | 0 refills | Status: DC | PRN

## 2017-06-07 MED ORDER — OXYCODONE HCL 5 MG PO TABS: 5.0000 mg | ORAL_TABLET | Freq: Four times a day (QID) | ORAL | 0 refills | Status: DC | PRN

## 2017-06-07 NOTE — Telephone Encounter (Signed)
PCP RN called to request PET scan for uninsured pt.  Pt will likely return to ED...PCP in need of PET scan to determine next steps for pt.

## 2017-06-07 NOTE — ED Notes (Signed)
Per mother, states he PCP wants her to be admitted due to her recent liver cancer diagnosis-states she was told at [facility] ED last night and that she needed a PET scan and biopsy done, mother didn't know why she wasn't admitted last night-informed pt/family that she might need a referral for an oncologist and that they would possible order tests as an outpatient

## 2017-06-07 NOTE — ED Triage Notes (Signed)
Per EMS-states she was just discharged from [facility] she has a kidney infection for over a month-just finished antibiotics-states she has not gotten pain meds filled or nausea medication because she is afraid to take it due to side effects

## 2017-06-08 ENCOUNTER — Encounter (HOSPITAL_COMMUNITY): Payer: Self-pay | Admitting: Internal Medicine

## 2017-06-08 ENCOUNTER — Inpatient Hospital Stay (HOSPITAL_COMMUNITY)
Admission: EM | Admit: 2017-06-08 | Discharge: 2017-06-14 | DRG: 436 | Disposition: A | Discharge status: Home Health | Payer: Medicaid Other | Attending: Internal Medicine | Admitting: Internal Medicine

## 2017-06-08 ENCOUNTER — Observation Stay (HOSPITAL_COMMUNITY): Payer: Medicaid Other

## 2017-06-08 ENCOUNTER — Observation Stay (HOSPITAL_BASED_OUTPATIENT_CLINIC_OR_DEPARTMENT_OTHER): Payer: Medicaid Other

## 2017-06-08 DIAGNOSIS — I2699 Other pulmonary embolism without acute cor pulmonale: Secondary | ICD-10-CM

## 2017-06-08 DIAGNOSIS — R000 Tachycardia, unspecified: Secondary | ICD-10-CM

## 2017-06-08 DIAGNOSIS — R109 Unspecified abdominal pain: Secondary | ICD-10-CM | POA: Diagnosis present

## 2017-06-08 DIAGNOSIS — E860 Dehydration: Secondary | ICD-10-CM | POA: Insufficient documentation

## 2017-06-08 DIAGNOSIS — F329 Major depressive disorder, single episode, unspecified: Secondary | ICD-10-CM

## 2017-06-08 DIAGNOSIS — C801 Malignant (primary) neoplasm, unspecified: Secondary | ICD-10-CM

## 2017-06-08 DIAGNOSIS — R1011 Right upper quadrant pain: Secondary | ICD-10-CM

## 2017-06-08 DIAGNOSIS — C799 Secondary malignant neoplasm of unspecified site: Secondary | ICD-10-CM

## 2017-06-08 DIAGNOSIS — F32A Depression, unspecified: Secondary | ICD-10-CM

## 2017-06-08 HISTORY — DX: Malignant (primary) neoplasm, unspecified: C80.1

## 2017-06-08 LAB — COMPREHENSIVE METABOLIC PANEL
ALBUMIN: 2.5 g/dL — AB (ref 3.5–5.0)
ALT: 36 U/L (ref 14–54)
ANION GAP: 23 — AB (ref 5–15)
AST: 86 U/L — ABNORMAL HIGH (ref 15–41)
Alkaline Phosphatase: 326 U/L — ABNORMAL HIGH (ref 38–126)
BUN: 27 mg/dL — ABNORMAL HIGH (ref 6–20)
CHLORIDE: 91 mmol/L — AB (ref 101–111)
CO2: 17 mmol/L — AB (ref 22–32)
Calcium: 9.4 mg/dL (ref 8.9–10.3)
Creatinine, Ser: 1.23 mg/dL — ABNORMAL HIGH (ref 0.44–1.00)
GFR calc non Af Amer: 53 mL/min — ABNORMAL LOW (ref >60)
Glucose, Bld: 130 mg/dL — ABNORMAL HIGH (ref 65–99)
POTASSIUM: 4.5 mmol/L (ref 3.5–5.1)
SODIUM: 131 mmol/L — AB (ref 135–145)
Total Bilirubin: 8.2 mg/dL — ABNORMAL HIGH (ref 0.3–1.2)
Total Protein: 6.9 g/dL (ref 6.5–8.1)

## 2017-06-08 LAB — OSMOLALITY, URINE: Osmolality, Ur: 393 mOsm/kg (ref 300–900)

## 2017-06-08 LAB — OSMOLALITY: OSMOLALITY: 298 mosm/kg — AB (ref 275–295)

## 2017-06-08 LAB — CBC WITH DIFFERENTIAL/PLATELET
BASOS ABS: 0.1 10*3/uL (ref 0.0–0.1)
BASOS PCT: 1 %
Eosinophils Absolute: 0.3 10*3/uL (ref 0.0–0.7)
Eosinophils Relative: 3 %
HCT: 42.7 % (ref 36.0–46.0)
Hemoglobin: 13.9 g/dL (ref 12.0–15.0)
Lymphocytes Relative: 13 %
Lymphs Abs: 1.4 10*3/uL (ref 0.7–4.0)
MCH: 24.6 pg — AB (ref 26.0–34.0)
MCHC: 32.6 g/dL (ref 30.0–36.0)
MCV: 75.6 fL — ABNORMAL LOW (ref 78.0–100.0)
MONO ABS: 1.4 10*3/uL — AB (ref 0.1–1.0)
MONOS PCT: 13 %
NEUTROS PCT: 70 %
Neutro Abs: 7.4 10*3/uL (ref 1.7–7.7)
PLATELETS: UNDETERMINED 10*3/uL (ref 150–400)
RBC: 5.65 MIL/uL — ABNORMAL HIGH (ref 3.87–5.11)
RDW: 27 % — ABNORMAL HIGH (ref 11.5–15.5)
WBC: 10.6 10*3/uL — ABNORMAL HIGH (ref 4.0–10.5)

## 2017-06-08 LAB — ECHOCARDIOGRAM COMPLETE
CHL CUP DOP CALC LVOT VTI: 17.6 cm
FS: 33 % (ref 28–44)
IV/PV OW: 0.96
LA ID, A-P, ES: 34 mm
LA diam end sys: 34 mm
LA vol: 32.5 mL
LADIAMINDEX: 1.48 cm/m2
LAVOLA4C: 34.8 mL
LAVOLIN: 14.2 mL/m2
LVOT area: 2.54 cm2
LVOT peak grad rest: 10 mmHg
LVOTD: 18 mm
LVOTPV: 155 cm/s
LVOTSV: 45 mL
PW: 7.88 mm — AB (ref 0.6–1.1)
TDI e' medial: 7.94

## 2017-06-08 LAB — URINALYSIS, ROUTINE W REFLEX MICROSCOPIC
BILIRUBIN URINE: NEGATIVE
GLUCOSE, UA: NEGATIVE mg/dL
KETONES UR: 5 mg/dL — AB
Nitrite: NEGATIVE
PH: 5 (ref 5.0–8.0)
PROTEIN: 30 mg/dL — AB
Specific Gravity, Urine: 1.018 (ref 1.005–1.030)

## 2017-06-08 LAB — GLUCOSE, CAPILLARY
GLUCOSE-CAPILLARY: 215 mg/dL — AB (ref 65–99)
Glucose-Capillary: 173 mg/dL — ABNORMAL HIGH (ref 65–99)

## 2017-06-08 LAB — CORTISOL: CORTISOL PLASMA: 29.4 ug/dL

## 2017-06-08 LAB — HEPARIN LEVEL (UNFRACTIONATED): Heparin Unfractionated: <0.1 IU/mL — ABNORMAL LOW (ref 0.30–0.70)

## 2017-06-08 LAB — CBG MONITORING, ED
Glucose-Capillary: 163 mg/dL — ABNORMAL HIGH (ref 65–99)
Glucose-Capillary: 156 mg/dL — ABNORMAL HIGH (ref 65–99)

## 2017-06-08 LAB — TROPONIN I
TROPONIN I: 0.03 ng/mL — AB (ref <0.03)
Troponin I: 0.04 ng/mL (ref <0.03)
Troponin I: 0.03 ng/mL (ref <0.03)

## 2017-06-08 LAB — SODIUM, URINE, RANDOM

## 2017-06-08 LAB — TSH: TSH: 17.684 u[IU]/mL — AB (ref 0.350–4.500)

## 2017-06-08 LAB — D-DIMER, QUANTITATIVE: D-Dimer, Quant: 3.02 ug/mL-FEU — ABNORMAL HIGH (ref 0.00–0.50)

## 2017-06-08 LAB — AMMONIA: Ammonia: 52 umol/L — ABNORMAL HIGH (ref 9–35)

## 2017-06-08 MED ORDER — LEVOTHYROXINE SODIUM 100 MCG PO TABS
200.0000 ug | ORAL_TABLET | Freq: Every day | ORAL | Status: DC
  Administered 2017-06-08 – 2017-06-14 (×7): 200 ug via ORAL
  Filled 2017-06-08: qty 2
  Filled 2017-06-08: qty 1
  Filled 2017-06-08 (×5): qty 2

## 2017-06-08 MED ORDER — SERTRALINE HCL 100 MG PO TABS
200.0000 mg | ORAL_TABLET | Freq: Every day | ORAL | Status: DC
  Administered 2017-06-08 – 2017-06-14 (×6): 200 mg via ORAL
  Filled 2017-06-08 (×4): qty 2
  Filled 2017-06-08: qty 4
  Filled 2017-06-08: qty 2

## 2017-06-08 MED ORDER — HYDROMORPHONE HCL 1 MG/ML IJ SOLN
1.0000 mg | Freq: Once | INTRAMUSCULAR | Status: AC
  Administered 2017-06-08: 1 mg via INTRAVENOUS
  Filled 2017-06-08: qty 1

## 2017-06-08 MED ORDER — HEPARIN (PORCINE) IN NACL 100-0.45 UNIT/ML-% IJ SOLN
1900.0000 [IU]/h | INTRAMUSCULAR | Status: DC
  Administered 2017-06-09: 1900 [IU]/h via INTRAVENOUS
  Filled 2017-06-08: qty 250

## 2017-06-08 MED ORDER — INSULIN ASPART 100 UNIT/ML ~~LOC~~ SOLN: 0.0000 [IU] | Freq: Every day | SUBCUTANEOUS | Status: DC

## 2017-06-08 MED ORDER — OXYCODONE HCL 5 MG PO TABS: 5.0000 mg | ORAL_TABLET | Freq: Four times a day (QID) | ORAL | Status: DC | PRN

## 2017-06-08 MED ORDER — ATENOLOL 50 MG PO TABS
50.0000 mg | ORAL_TABLET | Freq: Every day | ORAL | Status: DC
  Administered 2017-06-08 – 2017-06-14 (×6): 50 mg via ORAL
  Filled 2017-06-08 (×6): qty 1

## 2017-06-08 MED ORDER — IOPAMIDOL (ISOVUE-370) INJECTION 76%
INTRAVENOUS | Status: AC
  Administered 2017-06-08: 100 mL via INTRAVENOUS
  Filled 2017-06-08: qty 100

## 2017-06-08 MED ORDER — HEPARIN BOLUS VIA INFUSION
5000.0000 [IU] | Freq: Once | INTRAVENOUS | Status: AC
  Administered 2017-06-08: 5000 [IU] via INTRAVENOUS
  Filled 2017-06-08: qty 5000

## 2017-06-08 MED ORDER — SODIUM CHLORIDE 0.9 % IV BOLUS (SEPSIS)
1000.0000 mL | Freq: Once | INTRAVENOUS | Status: AC
  Administered 2017-06-08: 1000 mL via INTRAVENOUS

## 2017-06-08 MED ORDER — ENOXAPARIN SODIUM 40 MG/0.4ML ~~LOC~~ SOLN
40.0000 mg | SUBCUTANEOUS | Status: DC
  Filled 2017-06-08: qty 0.4

## 2017-06-08 MED ORDER — ONDANSETRON HCL 4 MG/2ML IJ SOLN
4.0000 mg | Freq: Once | INTRAMUSCULAR | Status: AC
  Administered 2017-06-08: 4 mg via INTRAVENOUS
  Filled 2017-06-08: qty 2

## 2017-06-08 MED ORDER — LINAGLIPTIN 5 MG PO TABS
5.0000 mg | ORAL_TABLET | Freq: Every day | ORAL | Status: DC
  Filled 2017-06-08: qty 1

## 2017-06-08 MED ORDER — ONDANSETRON HCL 4 MG PO TABS
4.0000 mg | ORAL_TABLET | Freq: Four times a day (QID) | ORAL | Status: DC | PRN
  Administered 2017-06-09 – 2017-06-13 (×6): 4 mg via ORAL
  Filled 2017-06-08 (×6): qty 1

## 2017-06-08 MED ORDER — NALOXONE HCL 2 MG/2ML IJ SOSY
1.0000 mg | PREFILLED_SYRINGE | Freq: Once | INTRAMUSCULAR | Status: AC
  Administered 2017-06-08: 1 mg via INTRAVENOUS
  Filled 2017-06-08: qty 2

## 2017-06-08 MED ORDER — INSULIN ASPART 100 UNIT/ML ~~LOC~~ SOLN
0.0000 [IU] | Freq: Three times a day (TID) | SUBCUTANEOUS | Status: DC
  Administered 2017-06-08: 2 [IU] via SUBCUTANEOUS
  Administered 2017-06-08: 3 [IU] via SUBCUTANEOUS
  Administered 2017-06-09: 2 [IU] via SUBCUTANEOUS
  Administered 2017-06-09 – 2017-06-10 (×3): 1 [IU] via SUBCUTANEOUS
  Administered 2017-06-12: 2 [IU] via SUBCUTANEOUS
  Administered 2017-06-12 – 2017-06-14 (×3): 1 [IU] via SUBCUTANEOUS
  Filled 2017-06-08: qty 1

## 2017-06-08 MED ORDER — HEPARIN BOLUS VIA INFUSION
2500.0000 [IU] | Freq: Once | INTRAVENOUS | Status: AC
  Administered 2017-06-08: 2500 [IU] via INTRAVENOUS
  Filled 2017-06-08: qty 2500

## 2017-06-08 MED ORDER — HYDROMORPHONE HCL 1 MG/ML IJ SOLN
1.0000 mg | INTRAMUSCULAR | Status: DC | PRN
  Administered 2017-06-08: 1 mg via INTRAVENOUS
  Filled 2017-06-08: qty 1

## 2017-06-08 MED ORDER — DIPHENHYDRAMINE HCL 50 MG/ML IJ SOLN: 25.0000 mg | Freq: Four times a day (QID) | INTRAMUSCULAR | Status: DC | PRN

## 2017-06-08 MED ORDER — CLONAZEPAM 0.5 MG PO TABS: 0.5000 mg | ORAL_TABLET | Freq: Two times a day (BID) | ORAL | Status: DC | PRN

## 2017-06-08 MED ORDER — HEPARIN (PORCINE) IN NACL 100-0.45 UNIT/ML-% IJ SOLN
1600.0000 [IU]/h | INTRAMUSCULAR | Status: DC
  Administered 2017-06-08: 1600 [IU]/h via INTRAVENOUS
  Filled 2017-06-08: qty 250

## 2017-06-08 MED ORDER — IOPAMIDOL (ISOVUE-370) INJECTION 76%
100.0000 mL | Freq: Once | INTRAVENOUS | Status: AC | PRN
  Administered 2017-06-08: 100 mL via INTRAVENOUS

## 2017-06-08 MED ORDER — LOSARTAN POTASSIUM 50 MG PO TABS
50.0000 mg | ORAL_TABLET | Freq: Every day | ORAL | Status: DC
Start: 2017-06-08 — End: 2017-06-08
  Filled 2017-06-08: qty 1

## 2017-06-08 MED ORDER — CLONAZEPAM 0.5 MG PO TABS: 0.5000 mg | ORAL_TABLET | Freq: Three times a day (TID) | ORAL | Status: DC | PRN

## 2017-06-08 MED ORDER — METOCLOPRAMIDE HCL 10 MG PO TABS
10.0000 mg | ORAL_TABLET | Freq: Four times a day (QID) | ORAL | Status: DC | PRN
  Administered 2017-06-09 (×2): 10 mg via ORAL
  Filled 2017-06-08 (×2): qty 1

## 2017-06-08 MED ORDER — POTASSIUM CHLORIDE IN NACL 20-0.9 MEQ/L-% IV SOLN
INTRAVENOUS | Status: AC
  Administered 2017-06-08 – 2017-06-09 (×2): via INTRAVENOUS
  Filled 2017-06-08 (×2): qty 1000

## 2017-06-08 MED ORDER — OXYCODONE HCL 5 MG PO TABS
5.0000 mg | ORAL_TABLET | Freq: Four times a day (QID) | ORAL | Status: DC | PRN
  Administered 2017-06-08 – 2017-06-11 (×9): 5 mg via ORAL
  Filled 2017-06-08 (×10): qty 1

## 2017-06-08 NOTE — ED Notes (Addendum)
[place], EPD orders EKG; EKG given to Alvino Chapel, MD. Pt alert and oriented x4 a minute post narcan administration. Awaiting call back from hospitalist to update. Holding all medication orders until pt reevaluated by hospitalist.

## 2017-06-08 NOTE — ED Notes (Signed)
Call report to [facility] at [phone] at [phone]

## 2017-06-08 NOTE — ED Notes (Signed)
Spoke with Dyann Kief, MD by phone and verbalizes looking into lab values at present time. This Probation officer will review any orders added.

## 2017-06-08 NOTE — ED Notes (Signed)
Spoke with Dyann Kief, MD regarding pt status and updated regarding event around 1000. Per Dyann Kief, MD reviewing medications at present and may give all medications as ordered post review/change in some orders.

## 2017-06-08 NOTE — ED Notes (Signed)
Zoila Shutter, secretary repaged hospitalist.

## 2017-06-08 NOTE — Progress Notes (Signed)
  Echocardiogram 2D Echocardiogram has been performed.  Merrie Roof F 06/08/2017, 11:40 AM

## 2017-06-08 NOTE — ED Notes (Signed)
Per pharmacy medications to be sent at 1000.

## 2017-06-08 NOTE — Progress Notes (Signed)
Patient seen and examined. Admitted after midnight secondary to flank pain and RUQ pain; also expressed some nausea and vomiting. Repeat image studies demonstrated hepatic metastatic lesions and given she was tachycardic and with mild RR a d-dimer was checked; which returned to be elevated and follow CT angio demonstrated positive PE. Patient has been started on IV heparin and 2-D echo done provide reassurance of not right heart strain and preserved EF, there was not wall motion abnormalities either.  Of note, patient received IV dilaudid to help with her side pain and ended experiencing changes in mentation, decrease in oxygen saturation and soft BP. Narcan given with good response in symptoms and recovery of hemodynamically stability.  Oncology consulted, recommended getting tissue biopsy (possible from liver) and then determine potential treatment. IR consulted and will see patient for liver biopsy. For now will continue supportive care, IV heparin and PRN analgesics (been caution with dose).  Please seen H&P written by Dr. Maudie Mercury for further info/details on admission.  Barton Dubois MD [phone]

## 2017-06-08 NOTE — ED Notes (Signed)
Date and time results received: 06/08/17 8:55 AM  (use smartphrase ".now" to insert current time)  Test: Troponin Critical Value: 0.04  Name of Provider Notified: Dyann Kief, MD  Orders Received? Or Actions Taken?: paged by Zoila Shutter, secretary

## 2017-06-08 NOTE — ED Notes (Signed)
Hospitalitis paged 2x with no call back, stated per RN

## 2017-06-08 NOTE — Progress Notes (Signed)
Pharmacy: Re- heparin  Patient's a 41 y.o F with suspected metastatic liver cancer, presented to the ED on 06/08/17 with c/o abdominal pain.  Chest CTA showed small PE in the LLL.  Heparin started for PE.  - first heparin level now back undetectable -  Per RN, no issues with IV line  Plan: - heparin 2500 units IV x1 bolus, then increase drip to 1900 units/hr - check 6 hr heparin level - monitor for s/s bleeding  Dia Sitter, PharmD, BCPS 06/08/2017 8:31 PM

## 2017-06-08 NOTE — ED Provider Notes (Signed)
[facility] DEPT Provider Note   CSN: [id number] Arrival date & time: 06/07/17  1741     History   Chief Complaint Chief Complaint  Patient presents with  . Flank Pain    HPI Patricia Mcclain is a 41 y.o. female.  HPI  This a 41 year old female who presents with continued abdominal pain.  Patient has had several evaluations over the last month including a medical admission for abdominal pain.  She had a CT scan that has found multiple lesions of the liver concerning for metastatic disease.  Patient reports continued mostly right upper quadrant pain that is nonradiating at home.  She rates her pain at 10 out of 10.  She has been taking nausea medication at home but "I am afraid of pain medication."  She has not been taking any pain medication at home for fear of addiction.  But she reports nausea and vomiting.  No diarrhea.  Last normal bowel movement was several days ago.  She has not had a formal primary cancer diagnosed at this time.  Denies fevers or urinary symptoms.  Past Medical History:  Diagnosis Date  . Cancer ([place])   . Congenital absence of one kidney   . Diabetes mellitus   . Hypertension   . Thyroid disease     Patient Active Problem List   Diagnosis Date Noted  . Abdominal pain 06/08/2017  . Dehydration   . Metastatic cancer ([place])   . Pyelonephritis   . Sepsis ([place]) 05/13/2017  . Hypothyroidism 05/13/2017  . HTN (hypertension) 05/13/2017  . Congenital absence of one kidney 05/13/2017  . CKD (chronic kidney disease), stage II 05/13/2017  . Acute on chronic renal failure ([place]) 05/13/2017  . 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 Obesity, Class III, BMI 40-49.9 (morbid obesity) ([place]) 05/13/2017    Past Surgical History:  Procedure Laterality Date  . CESAREAN SECTION  2004    OB History    No data available       Home Medications    Prior to Admission medications   Medication Sig Start  Date End Date Taking? Authorizing Provider  acetaminophen (TYLENOL) 325 MG tablet Take 2 tablets (650 mg total) by mouth every 6 (six) hours as needed (for pain, fever above 101 and Headache.). 05/15/17  Yes Barton Dubois, MD  atenolol (TENORMIN) 50 MG tablet Take 50 mg by mouth daily.   Yes [provider]  clonazePAM (KLONOPIN) 0.5 MG tablet Take 0.5 mg by mouth 3 (three) times daily as needed for anxiety.    Yes [provider]  insulin detemir (LEVEMIR) 100 unit/ml SOLN Inject 0.3 mLs (30 Units total) into the skin at bedtime. Patient taking differently: Inject 30 Units into the skin at bedtime as needed (low sugar).  05/15/17  Yes Barton Dubois, MD  levothyroxine (SYNTHROID, LEVOTHROID) 200 MCG tablet Take 200 mcg by mouth daily before breakfast. Take with 50mg  tablet for total dose of = 250mg    Yes [provider]  losartan (COZAAR) 100 MG tablet Take 0.5 tablets (50 mg total) by mouth daily. 05/16/17  Yes Barton Dubois, MD  ondansetron (ZOFRAN) 4 MG tablet Take 1 tablet (4 mg total) by mouth every 6 (six) hours as needed for nausea or vomiting. 85/2/77  Yes Delora Fuel, MD  sertraline (ZOLOFT) 100 MG tablet Take 200 mg by mouth daily.    Yes [provider]  sitaGLIPtin (JANUVIA) 100 MG tablet Take 100 mg by mouth  daily.   Yes [provider]  Insulin Pen Needle 31G X 5 MM MISC Use as needed to inject insulin daily as instructed 05/15/17   Barton Dubois, MD  metoCLOPramide (REGLAN) 10 MG tablet Take 1 tablet (10 mg total) by mouth every 6 (six) hours as needed for nausea. 06/06/17 06/13/17  Fenton Foy, MD  oxyCODONE (ROXICODONE) 5 MG immediate release tablet Take 1-2 tablets (5-10 mg total) by mouth every 6 (six) hours as needed for moderate pain or severe pain (take 1 tablet for moderate pain, 2 tablets for severe). 06/07/17 06/12/17  Fenton Foy, MD    Family History Family History  Problem Relation Age of Onset  . Peripheral vascular disease  Mother   . Diabetes Father     Social History Social History  Substance Use Topics  . Smoking status: Former Smoker    Quit date: 06/06/2013  . Smokeless tobacco: Never Used  . Alcohol use Yes     Allergies   Patient has no known allergies.   Review of Systems Review of Systems  Constitutional: Positive for fatigue. Negative for fever.  Respiratory: Negative for shortness of breath.   Cardiovascular: Negative for chest pain.  Gastrointestinal: Positive for abdominal pain, nausea and vomiting. Negative for constipation and diarrhea.  Genitourinary: Negative for dysuria.  All other systems reviewed and are negative.    Physical Exam Updated Vital Signs BP (!) 138/100 (BP Location: Right Arm)   Pulse (!) 124   Temp 97.6 F (36.4 C) (Oral)   Resp 13   Ht 5\' 6"  (1.676 m)   SpO2 93%   Physical Exam  Constitutional: She is oriented to person, place, and time.  Ill-appearing, no acute distress  HENT:  Head: Normocephalic and atraumatic.  Mucous membranes dry  Eyes: Pupils are equal, round, and reactive to light.  Bilateral icteric sclera  Cardiovascular: Regular rhythm and normal heart sounds.   Tachycardia  Pulmonary/Chest: Effort normal and breath sounds normal. No respiratory distress. She has no wheezes.  Abdominal: Soft. Bowel sounds are normal. She exhibits distension. There is tenderness.  Hepatomegaly noted with tenderness to palpation over the right upper quadrant, distention  Neurological: She is alert and oriented to person, place, and time.  Skin: Skin is warm and dry.  Psychiatric: She has a normal mood and affect.  Nursing note and vitals reviewed.    ED Treatments / Results  Labs (all labs ordered are listed, but only abnormal results are displayed) Labs Reviewed  URINALYSIS, ROUTINE W REFLEX MICROSCOPIC - Abnormal; Notable for the following:       Result Value   Color, Urine AMBER (*)    APPearance HAZY (*)    Hgb urine dipstick MODERATE (*)     Ketones, ur 5 (*)    Protein, ur 30 (*)    Leukocytes, UA TRACE (*)    Bacteria, UA MANY (*)    Squamous Epithelial / LPF 0-5 (*)    All other components within normal limits  CBC WITH DIFFERENTIAL/PLATELET - Abnormal; Notable for the following:    WBC 10.6 (*)    RBC 5.65 (*)    MCV 75.6 (*)    MCH 24.6 (*)    RDW 27.0 (*)    Monocytes Absolute 1.4 (*)    All other components within normal limits  COMPREHENSIVE METABOLIC PANEL - Abnormal; Notable for the following:    Sodium 131 (*)    Chloride 91 (*)    CO2 17 (*)  Glucose, Bld 130 (*)    BUN 27 (*)    Creatinine, Ser 1.23 (*)    Albumin 2.5 (*)    AST 86 (*)    Alkaline Phosphatase 326 (*)    Total Bilirubin 8.2 (*)    GFR calc non Af Amer 53 (*)    Anion gap 23 (*)    All other components within normal limits  AMMONIA - Abnormal; Notable for the following:    Ammonia 52 (*)    All other components within normal limits  OSMOLALITY  CORTISOL  TSH  SODIUM, URINE, RANDOM  OSMOLALITY, URINE    EKG  EKG Interpretation None       Radiology Ct Abdomen Pelvis W Contrast  Result Date: 06/06/2017 CLINICAL DATA:  Abdominal distention, nausea, diarrhea, weakness, jaundice EXAM: CT ABDOMEN AND PELVIS WITH CONTRAST TECHNIQUE: Multidetector CT imaging of the abdomen and pelvis was performed using the standard protocol following bolus administration of intravenous contrast. CONTRAST:  135mL ISOVUE-300 IOPAMIDOL (ISOVUE-300) INJECTION 61% COMPARISON:  05/10/2017 FINDINGS: Lower chest: Mixed cystic/ solid opacity in the posterior right lower lobe (series 5/ image 1), incompletely visualized. Hepatobiliary: Innumerable hepatic metastases throughout the liver. Gallbladder is unremarkable. No intrahepatic or extrahepatic ductal dilatation. Pancreas: Within normal limits. Spleen: Within normal limits. Adrenals/Urinary Tract: Adrenal glands are within normal limits. Cross fused renal ectopia.  No hydronephrosis. Bladder is within  normal limits. Stomach/Bowel: Stomach is within normal limits. No evidence of bowel obstruction. Normal appendix (series 3/ image 78). Vascular/Lymphatic: No evidence of abdominal aortic aneurysm. 16 mm short axis node along the porta hepatis (series 3/ image 36). Additional small upper abdominal lymph nodes. Reproductive: Uterus is within normal limits. Right ovary is mildly prominent. Left ovary is grossly unremarkable. Other: No abdominopelvic ascites. Musculoskeletal: Visualized osseous structures are within normal limits. IMPRESSION: Innumerable hepatic metastases, grossly unchanged. Associated prominent upper abdominal lymph nodes, including a 16 mm short axis node along the porta hepatis, suspicious for nodal metastasis. Mixed cystic/solid lesion in the posterior right lower lobe, incompletely visualized. The appearance on prior CT chest at least raises concern for possible primary bronchogenic neoplasm. Consider PET-CT and/or liver biopsy for further evaluation, as clinically warranted. Electronically Signed   By: Julian Hy M.D.   On: 06/06/2017 20:54    Procedures Procedures (including critical care time)  Medications Ordered in ED Medications  HYDROmorphone (DILAUDID) injection 1 mg (not administered)  diphenhydrAMINE (BENADRYL) injection 25 mg (not administered)  sodium chloride 0.9 % bolus 1,000 mL (0 mLs Intravenous Stopped 06/08/17 0454)  ondansetron (ZOFRAN) injection 4 mg (4 mg Intravenous Given 06/08/17 0203)  HYDROmorphone (DILAUDID) injection 1 mg (1 mg Intravenous Given 06/08/17 0214)  sodium chloride 0.9 % bolus 1,000 mL (1,000 mLs Intravenous New Bag/Given 06/08/17 0453)  HYDROmorphone (DILAUDID) injection 1 mg (1 mg Intravenous Given 06/08/17 0453)     Initial Impression / Assessment and Plan / ED Course  I have reviewed the triage vital signs and the nursing notes.  Pertinent labs & imaging results that were available during my care of the patient were reviewed by  me and considered in my medical decision making (see chart for details).  Clinical Course as of Jun 08 729  Wed Jun 08, 2017  0437 Patient reporting continued pain and unable to produce urine.  Additional pain medication and fluid bolus ordered.  In and out cath ordered.  [CH]    Clinical Course User Index [CH] Muad Noga, Barbette Hair, MD    Patient presents with  persistent abdominal pain, vomiting, generalized fatigue.  Recent workup and likely diagnosis of metastatic cancer of unknown primary.  She is ill-appearing on exam but nontoxic.  Tender to palpation mostly in the right upper quadrant with abdominal distention.  She appears clinically dry.  She is not taking pain medication at home for fear of addiction.  I had a lengthy discussion with the patient that given her CT scan findings and concern for metastatic cancer, it is very appropriate for her to take narcotic pain medication and she would likely not be symptom controlled without it.  Patient was given pain and nausea medication.  Lab work reviewed.  She has a worsening gap likely related to uremia and dehydration.  Otherwise her lab work is at baseline.  On multiple rechecks she reports persistent pain.  She is persistently tachycardic.  Baseline heart rate 100-110.  She is persistently in the 120s-130s.  Given her symptoms and persistent vital sign abnormalities, will admit for pain control and hydration.  Final Clinical Impressions(s) / ED Diagnoses   Final diagnoses:  Right upper quadrant abdominal pain  Dehydration  Metastatic cancer [facility]    New Prescriptions New Prescriptions   No medications on file     Merryl Hacker, MD 06/08/17 [phone]

## 2017-06-08 NOTE — H&P (Signed)
[facility] H&P   Patient Demographics:    Patricia Mcclain, is a 41 y.o. female  MRN: [id number]   DOB - [birth year]  Admit Date - 06/08/2017  Outpatient Primary MD for the patient is Levin Erp, MD  Referring MD/NP/PA: Thayer Jew  Outpatient Specialists:   Patient coming from: home  Chief Complaint  Patient presents with  . Flank Pain      HPI:    Patricia Mcclain  is a 41 y.o. female, w  Hypertension, Dm2, c/o right sided pain x 3 weeks.  + n/v, + weight loss.  Denies diarrhea, constipation, brbpr, black stool.  CT imaging 10/29   shows innumerable hepatic metastasis . ED requesting that patient be admitted for pain control   In ED,  Wbc 10.6, Hgb 13.9, Plt clumps Na 131, K 4.5, Bun 27, Creatinine 1.23 Ast 86, Alt 36 Alk phos 326 T. Bili 8.2  Ammonia 52  Pt will be admitted for pain control,    Review of systems:    In addition to the HPI above,  No Fever-chills, No Headache, No changes with Vision or hearing, No problems swallowing food or Liquids, No Chest pain, Cough or Shortness of Breath,  No Blood in stool or Urine, No dysuria, No new skin rashes or bruises, No new joints pains-aches,  No new weakness, tingling, numbness in any extremity, No recent weight gain or loss, No polyuria, polydypsia or polyphagia, No significant Mental Stressors.  A full 10 point Review of Systems was done, except as stated above, all other Review of Systems were negative.   With Past History of the following :    Past Medical History:  Diagnosis Date  . Cancer ([place])   . Congenital absence of one kidney   . Diabetes mellitus   . Hypertension   . Thyroid disease       Past Surgical History:  Procedure Laterality Date  . CESAREAN SECTION  2004      Social History:     Social History  Substance Use Topics  . Smoking status: Former Smoker    Quit date:  06/06/2013  . Smokeless tobacco: Never Used  . Alcohol use Yes     Lives - at home  Mobility - walks by self  Family History :     Family History  Problem Relation Age of Onset  . Peripheral vascular disease Mother   . Diabetes Father          Home Medications:   Prior to Admission medications   Medication Sig Start Date End Date Taking? Authorizing Provider  acetaminophen (TYLENOL) 325 MG tablet Take 2 tablets (650 mg total) by mouth every 6 (six) hours as needed (for pain, fever above 101 and Headache.). 05/15/17  Yes Barton Dubois, MD  atenolol (TENORMIN) 50 MG tablet Take 50 mg by mouth daily.   Yes [provider]  clonazePAM (KLONOPIN) 0.5 MG tablet Take 0.5 mg by mouth 3 (three) times daily as needed for anxiety.    Yes [provider]  insulin detemir (LEVEMIR) 100 unit/ml SOLN Inject 0.3 mLs (30 Units total) into the skin at bedtime. Patient taking differently: Inject 30 Units into the skin at bedtime as needed (low sugar).  05/15/17  Yes Barton Dubois, MD  levothyroxine (SYNTHROID, LEVOTHROID) 200 MCG tablet Take 200 mcg by mouth daily before breakfast. Take with 75m tablet for total dose of = 2535m  Yes [provider]  losartan (COZAAR) 100 MG tablet Take 0.5 tablets (50 mg total) by mouth daily. 05/16/17  Yes MaBarton DuboisMD  ondansetron (ZOFRAN) 4 MG tablet Take 1 tablet (4 mg total) by mouth every 6 (six) hours as needed for nausea or vomiting. 1020/1/00Yes GlDelora FuelMD  sertraline (ZOLOFT) 100 MG tablet Take 200 mg by mouth daily.    Yes [provider]  sitaGLIPtin (JANUVIA) 100 MG tablet Take 100 mg by mouth daily.   Yes [provider]  Insulin Pen Needle 31G X 5 MM MISC Use as needed to inject insulin daily as instructed 05/15/17   MaBarton DuboisMD  metoCLOPramide (REGLAN) 10 MG tablet Take 1 tablet (10 mg total) by mouth every 6 (six) hours as needed for nausea. 06/06/17 06/13/17  GaFenton FoyMD  oxyCODONE  (ROXICODONE) 5 MG immediate release tablet Take 1-2 tablets (5-10 mg total) by mouth every 6 (six) hours as needed for moderate pain or severe pain (take 1 tablet for moderate pain, 2 tablets for severe). 06/07/17 06/12/17  GaFenton FoyMD     Allergies:    No Known Allergies   Physical Exam:   Vitals  Blood pressure (!) 137/99, pulse (!) 111, temperature 97.6 F (36.4 C), temperature source Oral, resp. rate 16, height 5' 6"  (1.676 m), SpO2 94 %.   1. General  lying in bed in NAD,    2. Normal affect and insight, Not Suicidal or Homicidal, Awake Alert, Oriented X 3.  3. No F.N deficits, ALL C.Nerves Intact, Strength 5/5 all 4 extremities, Sensation intact all 4 extremities, Plantars down going.  4. Ears and Eyes appear Normal, Conjunctivae clear, PERRLA. Moist Oral Mucosa.  5. Supple Neck, No JVD, No cervical lymphadenopathy appriciated, No Carotid Bruits.  6. Symmetrical Chest wall movement, Good air movement bilaterally, CTAB.  7. Tachy s1, s2,  No Gallops, Rubs or Murmurs, No Parasternal Heave.  8. Positive Bowel Sounds, Abdomen Soft, No tenderness, No organomegaly appriciated,No rebound -guarding or rigidity.  9.  No Cyanosis, Normal Skin Turgor, No Skin Rash or Bruise.  10. Good muscle tone,  joints appear normal , no effusions, Normal ROM.  11. No Palpable Lymph Nodes in Neck or Axillae     Data Review:    CBC  Recent Labs Lab 06/06/17 2039 06/08/17 0143  WBC 9.6 10.6*  HGB 12.4 13.9  HCT 39.6 42.7  PLT 270 PLATELET CLUMPS NOTED ON SMEAR, UNABLE TO ESTIMATE  MCV 76.2* 75.6*  MCH 23.8* 24.6*  MCHC 31.3 32.6  RDW 27.1* 27.0*  LYMPHSABS 0.9 1.4  MONOABS 1.3* 1.4*  EOSABS 0.4 0.3  BASOSABS 0.1 0.1   ------------------------------------------------------------------------------------------------------------------  Chemistries   Recent Labs Lab 06/06/17 2039 06/08/17 0143  NA 131* 131*  K 4.0 4.5  CL 93* 91*  CO2 21* 17*  GLUCOSE 115* 130*  BUN  13 27*  CREATININE 1.08* 1.23*  CALCIUM 9.1 9.4  AST  102* 86*  ALT 40 36  ALKPHOS 319* 326*  BILITOT 8.2* 8.2*   ------------------------------------------------------------------------------------------------------------------ CrCl cannot be calculated (Unknown ideal weight.). ------------------------------------------------------------------------------------------------------------------ No results for input(s): TSH, T4TOTAL, T3FREE, THYROIDAB in the last 72 hours.  Invalid input(s): FREET3  Coagulation profile  Recent Labs Lab 06/06/17 2039  INR 1.15   ------------------------------------------------------------------------------------------------------------------- No results for input(s): DDIMER in the last 72 hours. -------------------------------------------------------------------------------------------------------------------  Cardiac Enzymes No results for input(s): CKMB, TROPONINI, MYOGLOBIN in the last 168 hours.  Invalid input(s): CK ------------------------------------------------------------------------------------------------------------------ No results found for: BNP   ---------------------------------------------------------------------------------------------------------------  Urinalysis    Component Value Date/Time   COLORURINE AMBER (A) 06/08/2017 0435   APPEARANCEUR HAZY (A) 06/08/2017 0435   LABSPEC 1.018 06/08/2017 0435   PHURINE 5.0 06/08/2017 0435   GLUCOSEU NEGATIVE 06/08/2017 0435   HGBUR MODERATE (A) 06/08/2017 0435   BILIRUBINUR NEGATIVE 06/08/2017 0435   KETONESUR 5 (A) 06/08/2017 0435   PROTEINUR 30 (A) 06/08/2017 0435   UROBILINOGEN 1.0 03/18/2011 2118   NITRITE NEGATIVE 06/08/2017 0435   LEUKOCYTESUR TRACE (A) 06/08/2017 0435    ----------------------------------------------------------------------------------------------------------------   Imaging Results:    Ct Abdomen Pelvis W Contrast  Result Date:  06/06/2017 CLINICAL DATA:  Abdominal distention, nausea, diarrhea, weakness, jaundice EXAM: CT ABDOMEN AND PELVIS WITH CONTRAST TECHNIQUE: Multidetector CT imaging of the abdomen and pelvis was performed using the standard protocol following bolus administration of intravenous contrast. CONTRAST:  168m ISOVUE-300 IOPAMIDOL (ISOVUE-300) INJECTION 61% COMPARISON:  05/10/2017 FINDINGS: Lower chest: Mixed cystic/ solid opacity in the posterior right lower lobe (series 5/ image 1), incompletely visualized. Hepatobiliary: Innumerable hepatic metastases throughout the liver. Gallbladder is unremarkable. No intrahepatic or extrahepatic ductal dilatation. Pancreas: Within normal limits. Spleen: Within normal limits. Adrenals/Urinary Tract: Adrenal glands are within normal limits. Cross fused renal ectopia.  No hydronephrosis. Bladder is within normal limits. Stomach/Bowel: Stomach is within normal limits. No evidence of bowel obstruction. Normal appendix (series 3/ image 78). Vascular/Lymphatic: No evidence of abdominal aortic aneurysm. 16 mm short axis node along the porta hepatis (series 3/ image 36). Additional small upper abdominal lymph nodes. Reproductive: Uterus is within normal limits. Right ovary is mildly prominent. Left ovary is grossly unremarkable. Other: No abdominopelvic ascites. Musculoskeletal: Visualized osseous structures are within normal limits. IMPRESSION: Innumerable hepatic metastases, grossly unchanged. Associated prominent upper abdominal lymph nodes, including a 16 mm short axis node along the porta hepatis, suspicious for nodal metastasis. Mixed cystic/solid lesion in the posterior right lower lobe, incompletely visualized. The appearance on prior CT chest at least raises concern for possible primary bronchogenic neoplasm. Consider PET-CT and/or liver biopsy for further evaluation, as clinically warranted. Electronically Signed   By: SJulian HyM.D.   On: 06/06/2017 20:54      Assessment & Plan:    Active Problems:   Abdominal pain    Abdominal pain Oxycodone 570mpo q4h prn  Metastatic disease to liver Please consult oncology this am  Abnormal liver function Check acute hepatitis panel  Tachycardia Tele Trop I q6h x3 D dimer If D dimer is positive then CTA chest Check tsh Check cardiac echo  Hyponatremia Check serum osm, cortisol, tsh Check urine sodium, urine osm Hydrate with  Ns iv  Dm2 fsbs and and qhs, ISS    DVT Prophylaxis Lovenox - SCDs  AM Labs Ordered, also please review Full Orders  Family Communication: Admission, patients condition and plan of care including tests being ordered have been discussed with the patient who indicate understanding and agree with the plan and  Code Status.  Code Status FULL CODE  Likely DC to  home  Condition GUARDED   Consults called:   Admission status: observation  Time spent in minutes : 45   Jani Gravel M.D on 06/08/2017 at 6:15 AM  Between 7am to 7pm - Pager - [phone]. After 7pm go to [email or web address] - password [email or web address]  [facility] - Office  [phone]

## 2017-06-08 NOTE — ED Notes (Signed)
Pt transported to CT ?

## 2017-06-08 NOTE — ED Notes (Signed)
Spoke with Dyann Kief, MD regarding plan of care post CT results. See orders to administer heparin. Pt clear liquid diet/tray have been ordered. Insulin to be administered with tray arrival.

## 2017-06-08 NOTE — ED Notes (Signed)
At time of reassessing VS, see VS collection [phone], pt not responding to voice or sternal rub. Pt placed on 2 lpm [place]. This Probation officer had face to face interaction with Patricia Mcclain, EDP and had Zoila Shutter, Network engineer page the hospitalist. Patricia Mcclain, EDP at bedside; verbal order to give 1 mg of narcan.

## 2017-06-08 NOTE — ED Provider Notes (Addendum)
  Physical Exam  BP (!) 165/121 (BP Location: Right Arm)   Pulse (!) 133   Temp 98.9 F (37.2 C) (Oral)   Resp 14   Ht 5\' 6"  (1.676 m)   SpO2 92%   Physical Exam  ED Course  Procedures  MDM Called into see patient for unresponsiveness.  Had had Dilaudid 2 or 3 hours ago otherwise no new medications.  Nursing had attempted to contact hospitalist but not heard back yet.  Patient is mostly unresponsive.  Hands will drop on your head when held above it.  No gag reflex but is breathing spontaneously although somewhat slowly.  Pupils somewhat constricted.  Given 1 mg of Narcan because she became more responsive.  Likely medication is the cause of the decreased mental status.  She is however tachycardic. EKg sinus tachy   EKG Interpretation  Date/Time:  Wednesday June 08 2017 10:06:29 EDT Ventricular Rate:  130 PR Interval:    QRS Duration: 74 QT Interval:  341 QTC Calculation: 502 R Axis:   10 Text Interpretation:  Sinus tachycardia Multiform ventricular premature complexes Aberrant complex LAE, consider biatrial enlargement Borderline low voltage, extremity leads Borderline prolonged QT interval Confirmed by Davonna Belling [phone]) on 06/08/2017 10:09:37 AM      Patient has had persistent tachycardia.  Positive d-dimer.  CTA ordered per hospitalist plan.       Davonna Belling, MD 06/08/17 1008    Davonna Belling, MD 06/08/17 1012

## 2017-06-08 NOTE — Progress Notes (Signed)
ANTICOAGULATION CONSULT NOTE - Initial Consult  Pharmacy Consult for Heparin Indication: pulmonary embolus  No Known Allergies  Patient Measurements: Height: 5\' 6"  (167.6 cm) IBW/kg (Calculated) : 59.3  ActBW 125kg on 05/13/17 Heparin Dosing Weight: 90kg  Vital Signs: Temp: 97.8 F (36.6 C) (10/31 1133) Temp Source: Oral (10/31 1133) BP: 140/104 (10/31 1133) Pulse Rate: 128 (10/31 1133)  Labs:  Recent Labs  06/06/17 2039 06/08/17 0143 06/08/17 0801  HGB 12.4 13.9  --   HCT 39.6 42.7  --   PLT 270 PLATELET CLUMPS NOTED ON SMEAR, UNABLE TO ESTIMATE  --   LABPROT 14.6  --   --   INR 1.15  --   --   CREATININE 1.08* 1.23*  --   TROPONINI  --   --  0.04*    CrCl cannot be calculated (Unknown ideal weight.).   Medical History: Past Medical History:  Diagnosis Date  . Cancer ([place])   . Congenital absence of one kidney   . Diabetes mellitus   . Hypertension   . Thyroid disease     Medications:  Infusions:  . 0.9 % NaCl with KCl 20 mEq / L 75 mL/hr at 06/08/17 1223  No anticoagulants PTA  Assessment: Patient admitted overnight with abdominal pain, concern for metastatic liver CA.  She became unresponsive while still in ED- work-up revealed small LLL PE.  No right heart strain per CT.   CBC reviewed- Hg WNL. Pltc clumped, but WNL on 10/29.   Scr mildly elevated from baseline.  No bleeding reported.   Goal of Therapy:  Heparin level 0.3-0.7 units/ml Monitor platelets by anticoagulation protocol: Yes   Plan:  Give 5000 units bolus x 1 Start heparin infusion at 1600 units/hr Check anti-Xa level in 6 hours and daily while on heparin Continue to monitor H&H and platelets  Kathelene Rumberger, Lavonia Drafts 06/08/2017,12:28 PM

## 2017-06-09 DIAGNOSIS — Z6841 Body Mass Index (BMI) 40.0 and over, adult: Secondary | ICD-10-CM | POA: Diagnosis not present

## 2017-06-09 DIAGNOSIS — K5909 Other constipation: Secondary | ICD-10-CM | POA: Diagnosis present

## 2017-06-09 DIAGNOSIS — C801 Malignant (primary) neoplasm, unspecified: Secondary | ICD-10-CM

## 2017-06-09 DIAGNOSIS — Q600 Renal agenesis, unilateral: Secondary | ICD-10-CM | POA: Diagnosis not present

## 2017-06-09 DIAGNOSIS — E1122 Type 2 diabetes mellitus with diabetic chronic kidney disease: Secondary | ICD-10-CM | POA: Diagnosis present

## 2017-06-09 DIAGNOSIS — N179 Acute kidney failure, unspecified: Secondary | ICD-10-CM | POA: Diagnosis present

## 2017-06-09 DIAGNOSIS — F419 Anxiety disorder, unspecified: Secondary | ICD-10-CM | POA: Diagnosis present

## 2017-06-09 DIAGNOSIS — Z87891 Personal history of nicotine dependence: Secondary | ICD-10-CM | POA: Diagnosis not present

## 2017-06-09 DIAGNOSIS — Z794 Long term (current) use of insulin: Secondary | ICD-10-CM | POA: Diagnosis not present

## 2017-06-09 DIAGNOSIS — E039 Hypothyroidism, unspecified: Secondary | ICD-10-CM | POA: Diagnosis present

## 2017-06-09 DIAGNOSIS — E860 Dehydration: Secondary | ICD-10-CM | POA: Diagnosis present

## 2017-06-09 DIAGNOSIS — R109 Unspecified abdominal pain: Secondary | ICD-10-CM | POA: Diagnosis present

## 2017-06-09 DIAGNOSIS — Z79899 Other long term (current) drug therapy: Secondary | ICD-10-CM | POA: Diagnosis not present

## 2017-06-09 DIAGNOSIS — N182 Chronic kidney disease, stage 2 (mild): Secondary | ICD-10-CM | POA: Diagnosis present

## 2017-06-09 DIAGNOSIS — C787 Secondary malignant neoplasm of liver and intrahepatic bile duct: Secondary | ICD-10-CM | POA: Diagnosis not present

## 2017-06-09 DIAGNOSIS — F329 Major depressive disorder, single episode, unspecified: Secondary | ICD-10-CM | POA: Diagnosis present

## 2017-06-09 DIAGNOSIS — I129 Hypertensive chronic kidney disease with stage 1 through stage 4 chronic kidney disease, or unspecified chronic kidney disease: Secondary | ICD-10-CM | POA: Diagnosis present

## 2017-06-09 DIAGNOSIS — G893 Neoplasm related pain (acute) (chronic): Secondary | ICD-10-CM | POA: Diagnosis present

## 2017-06-09 LAB — COMPREHENSIVE METABOLIC PANEL
ALT: 45 U/L (ref 14–54)
AST: 103 U/L — ABNORMAL HIGH (ref 15–41)
Albumin: 2.6 g/dL — ABNORMAL LOW (ref 3.5–5.0)
Alkaline Phosphatase: 278 U/L — ABNORMAL HIGH (ref 38–126)
Anion gap: 14 (ref 5–15)
BILIRUBIN TOTAL: 7 mg/dL — AB (ref 0.3–1.2)
BUN: 37 mg/dL — AB (ref 6–20)
CHLORIDE: 97 mmol/L — AB (ref 101–111)
CO2: 20 mmol/L — ABNORMAL LOW (ref 22–32)
CREATININE: 1.27 mg/dL — AB (ref 0.44–1.00)
Calcium: 9.2 mg/dL (ref 8.9–10.3)
GFR, EST AFRICAN AMERICAN: 59 mL/min — AB (ref >60)
GFR, EST NON AFRICAN AMERICAN: 51 mL/min — AB (ref >60)
Glucose, Bld: 137 mg/dL — ABNORMAL HIGH (ref 65–99)
POTASSIUM: 4.3 mmol/L (ref 3.5–5.1)
Sodium: 131 mmol/L — ABNORMAL LOW (ref 135–145)
TOTAL PROTEIN: 6.9 g/dL (ref 6.5–8.1)

## 2017-06-09 LAB — CBC
HEMATOCRIT: 38.9 % (ref 36.0–46.0)
Hemoglobin: 12.8 g/dL (ref 12.0–15.0)
MCH: 24.7 pg — ABNORMAL LOW (ref 26.0–34.0)
MCHC: 32.9 g/dL (ref 30.0–36.0)
MCV: 75.1 fL — AB (ref 78.0–100.0)
PLATELETS: 284 10*3/uL (ref 150–400)
RBC: 5.18 MIL/uL — AB (ref 3.87–5.11)
RDW: 26.5 % — AB (ref 11.5–15.5)
WBC: 11.9 10*3/uL — AB (ref 4.0–10.5)

## 2017-06-09 LAB — GLUCOSE, CAPILLARY
GLUCOSE-CAPILLARY: 142 mg/dL — AB (ref 65–99)
GLUCOSE-CAPILLARY: 152 mg/dL — AB (ref 65–99)
Glucose-Capillary: 120 mg/dL — ABNORMAL HIGH (ref 65–99)

## 2017-06-09 LAB — HEPARIN LEVEL (UNFRACTIONATED)
HEPARIN UNFRACTIONATED: 0.16 [IU]/mL — AB (ref 0.30–0.70)
Heparin Unfractionated: 0.33 IU/mL (ref 0.30–0.70)

## 2017-06-09 MED ORDER — HEPARIN (PORCINE) IN NACL 100-0.45 UNIT/ML-% IJ SOLN
2400.0000 [IU]/h | INTRAMUSCULAR | Status: DC
  Administered 2017-06-09: 2400 [IU]/h via INTRAVENOUS

## 2017-06-09 MED ORDER — HEPARIN (PORCINE) IN NACL 100-0.45 UNIT/ML-% IJ SOLN
2300.0000 [IU]/h | INTRAMUSCULAR | Status: DC
  Filled 2017-06-09: qty 250

## 2017-06-09 MED ORDER — HEPARIN BOLUS VIA INFUSION
2500.0000 [IU] | Freq: Once | INTRAVENOUS | Status: AC
  Administered 2017-06-09: 2500 [IU] via INTRAVENOUS
  Filled 2017-06-09: qty 2500

## 2017-06-09 MED ORDER — HEPARIN SODIUM (PORCINE) 5000 UNIT/ML IJ SOLN
5000.0000 [IU] | Freq: Three times a day (TID) | INTRAMUSCULAR | Status: DC
  Filled 2017-06-09: qty 1

## 2017-06-09 MED ORDER — HEPARIN SODIUM (PORCINE) 5000 UNIT/ML IJ SOLN
5000.0000 [IU] | Freq: Three times a day (TID) | INTRAMUSCULAR | Status: DC
  Administered 2017-06-10 – 2017-06-12 (×6): 5000 [IU] via SUBCUTANEOUS
  Filled 2017-06-09 (×7): qty 1

## 2017-06-09 NOTE — Progress Notes (Signed)
ANTICOAGULATION CONSULT NOTE - f/u Consult  Pharmacy Consult for Heparin Indication: pulmonary embolus  No Known Allergies  Patient Measurements: Height: 5\' 6"  (167.6 cm) Weight: 255 lb 11.7 oz (116 kg) IBW/kg (Calculated) : 59.3  ActBW 125kg on 05/13/17 Heparin Dosing Weight: 90kg  Vital Signs: Temp: 97.9 F (36.6 C) (10/31 2158) Temp Source: Oral (10/31 2158) BP: 117/84 (10/31 2158) Pulse Rate: 91 (10/31 2158)  Labs:  Recent Labs  06/06/17 2039 06/08/17 0143 06/08/17 0801 06/08/17 1303 06/08/17 1920 06/09/17 0300  HGB 12.4 13.9  --   --   --  12.8  HCT 39.6 42.7  --   --   --  38.9  PLT 270 PLATELET CLUMPS NOTED ON SMEAR, UNABLE TO ESTIMATE  --   --   --  284  LABPROT 14.6  --   --   --   --   --   INR 1.15  --   --   --   --   --   HEPARINUNFRC  --   --   --   --  <0.10* 0.16*  CREATININE 1.08* 1.23*  --   --   --  1.27*  TROPONINI  --   --  0.04* 0.03* 0.03*  --     Estimated Creatinine Clearance: 74.7 mL/min (A) (by C-G formula based on SCr of 1.27 mg/dL (H)).   Medical History: Past Medical History:  Diagnosis Date  . Cancer ([place])   . Congenital absence of one kidney   . Diabetes mellitus   . Hypertension   . Thyroid disease     Medications:  Infusions:  . 0.9 % NaCl with KCl 20 mEq / L 75 mL/hr at 06/09/17 0014  . heparin    No anticoagulants PTA  Assessment: Patient admitted overnight with abdominal pain, concern for metastatic liver CA.  She became unresponsive while still in ED- work-up revealed small LLL PE.  No right heart strain per CT.   CBC reviewed- Hg WNL. Pltc clumped, but WNL on 10/29.   Scr mildly elevated from baseline.  No bleeding reported.  Today, 11/1 0300 HL=0.16 below goal, no infusion or bleeding issues per RN (note patient is on mentrual cycle) Goal of Therapy:  Heparin level 0.3-0.7 units/ml Monitor platelets by anticoagulation protocol: Yes   Plan:  Rebolus with 2500 units IV heparin now Increase drip to 2300  units/hr Recheck HL in 6 lhours Daily CBC/HL  Lawana Pai R 06/09/2017,3:40 AM

## 2017-06-09 NOTE — Progress Notes (Signed)
ANTICOAGULATION CONSULT NOTE - Follow Up Consult  Pharmacy Consult for heparin Indication: new pulmonary embolus  No Known Allergies  Patient Measurements: Height: 5\' 6"  (167.6 cm) Weight: 257 lb 6.4 oz (116.8 kg) IBW/kg (Calculated) : 59.3 Heparin Dosing Weight: 90 kg  Vital Signs: Temp: 97.7 F (36.5 C) (11/01 0535) Temp Source: Oral (11/01 0535) BP: 117/86 (11/01 0535) Pulse Rate: 93 (11/01 0535)  Labs:  Recent Labs  06/06/17 2039 06/08/17 0143 06/08/17 0801 06/08/17 1303 06/08/17 1920 06/09/17 0300 06/09/17 0955  HGB 12.4 13.9  --   --   --  12.8  --   HCT 39.6 42.7  --   --   --  38.9  --   PLT 270 PLATELET CLUMPS NOTED ON SMEAR, UNABLE TO ESTIMATE  --   --   --  284  --   LABPROT 14.6  --   --   --   --   --   --   INR 1.15  --   --   --   --   --   --   HEPARINUNFRC  --   --   --   --  <0.10* 0.16* 0.33  CREATININE 1.08* 1.23*  --   --   --  1.27*  --   TROPONINI  --   --  0.04* 0.03* 0.03*  --   --     Estimated Creatinine Clearance: 75 mL/min (A) (by C-G formula based on SCr of 1.27 mg/dL (H)).   Assessment: Patient admitted 06/08/17 with abdominal pain, concern for metastatic liver CA.She became unresponsive while still in ED. Work-up revealed elevated D-dimer and CT showed small LLL PE with no right heart strain. Pharmacy consulted to dose IV heparin.  06/09/2017 - Heparin level at lower end of target range on 2300 units/hr. Level checked at appropriate time. Rate running appropriately. - Hgb and plt stable. No signs of bleeding per patient and RN. Note patient is on menstrual cycle but reports no increase in flow. - Scr 1.27, CrCl 75 ml/min.   Goal of Therapy:  Heparin level 0.3-0.7 units/ml Monitor platelets by anticoagulation protocol: Yes   Plan:  - Increase rate to 2400 units/hour to target middle of therapeutic range - Re-check heparin level 6 hours after rate increase.  - Monitor daily heparin level and CBC - Monitor signs and symptoms of  bleeding or thrombosis.  - f/u plans for long term anticoagulation   Richmond Campbell  PharmD Candidate 06/09/2017,10:58 AM

## 2017-06-09 NOTE — Consult Note (Signed)
Chief Complaint: Patient was seen in consultation today for image guided liver lesion biopsy Chief Complaint  Patient presents with  . Flank Pain    Referring Physician(s): [place]  Supervising Physician: Markus Daft  Patient Status: [facility] - In-pt  History of Present Illness: Patricia Mcclain is a 41 y.o. female with past medical history of hypertension, diabetes who was recently admitted to the hospital with right sided abdominal pain, nausea, vomiting, weight loss, night sweats. She has no known history of cancer. Laboratory studies revealed elevated LFTs/ creatinine. Recent imaging studies have revealed innumerable hepatic metastases of unknown etiology with upper abdominal adenopathy and nodular opacities in both lower lobes of lung along with subtle sclerosis in T1 and T2 vertebral bodies. Request now received for image guided liver lesion biopsy for further evaluation.  Past Medical History:  Diagnosis Date  . Cancer ([place])   . Congenital absence of one kidney   . Diabetes mellitus   . Hypertension   . Thyroid disease     Past Surgical History:  Procedure Laterality Date  . CESAREAN SECTION  2004    Allergies: Patient has no known allergies.  Medications: Prior to Admission medications   Medication Sig Start Date End Date Taking? Authorizing Provider  acetaminophen (TYLENOL) 325 MG tablet Take 2 tablets (650 mg total) by mouth every 6 (six) hours as needed (for pain, fever above 101 and Headache.). 05/15/17  Yes Barton Dubois, MD  atenolol (TENORMIN) 50 MG tablet Take 50 mg by mouth daily.   Yes [provider]  clonazePAM (KLONOPIN) 0.5 MG tablet Take 0.5 mg by mouth 3 (three) times daily as needed for anxiety.    Yes [provider]  insulin detemir (LEVEMIR) 100 unit/ml SOLN Inject 0.3 mLs (30 Units total) into the skin at bedtime. Patient taking differently: Inject 30 Units into the skin at bedtime as needed (low sugar).  05/15/17  Yes Barton Dubois, MD  levothyroxine (SYNTHROID, LEVOTHROID) 200 MCG tablet Take 200 mcg by mouth daily before breakfast. Take with 50mg  tablet for total dose of = 250mg    Yes [provider]  losartan (COZAAR) 100 MG tablet Take 0.5 tablets (50 mg total) by mouth daily. 05/16/17  Yes Barton Dubois, MD  ondansetron (ZOFRAN) 4 MG tablet Take 1 tablet (4 mg total) by mouth every 6 (six) hours as needed for nausea or vomiting. 95/2/84  Yes Delora Fuel, MD  sertraline (ZOLOFT) 100 MG tablet Take 200 mg by mouth daily.    Yes [provider]  sitaGLIPtin (JANUVIA) 100 MG tablet Take 100 mg by mouth daily.   Yes [provider]  Insulin Pen Needle 31G X 5 MM MISC Use as needed to inject insulin daily as instructed 05/15/17   Barton Dubois, MD  metoCLOPramide (REGLAN) 10 MG tablet Take 1 tablet (10 mg total) by mouth every 6 (six) hours as needed for nausea. 06/06/17 06/13/17  Fenton Foy, MD  oxyCODONE (ROXICODONE) 5 MG immediate release tablet Take 1-2 tablets (5-10 mg total) by mouth every 6 (six) hours as needed for moderate pain or severe pain (take 1 tablet for moderate pain, 2 tablets for severe). 06/07/17 06/12/17  Fenton Foy, MD     Family History  Problem Relation Age of Onset  . Peripheral vascular disease Mother   . Diabetes Father     Social History   Social History  . Marital status: Single    Spouse name: N/A  . Number of children: N/A  .  Years of education: N/A   Social History Main Topics  . Smoking status: Former Smoker    Quit date: 06/06/2013  . Smokeless tobacco: Never Used  . Alcohol use Yes  . Drug use: No  . Sexual activity: Yes    Birth control/ protection: None   Other Topics Concern  . None   Social History Narrative  . None      Review of Systems see above; currently denies fever, headache, chest pain, dyspnea, cough, back pain, nausea ,vomiting or bleeding.  Vital Signs: BP 117/86 (BP Location: Right Arm)   Pulse 93   Temp 97.7 F  (36.5 C) (Oral)   Resp 20   Ht 5\' 6"  (1.676 m)   Wt 257 lb 6.4 oz (116.8 kg)   LMP 06/07/2017   SpO2 94%   BMI 41.55 kg/m   Physical Exam awake, alert. Chest clear to auscultation anteriorly/bilat; heart with regular rate and rhythm; abdomen obese, soft, positive bowel sounds, tender right upper quadrant/ epigastric region; no LE edema  Imaging: Ct Abdomen Pelvis Wo Contrast  Result Date: 05/11/2017 CLINICAL DATA:  Productive cough with mild shortness-of-breath and right-sided pain with nausea, vomiting and diarrhea 2 weeks. Dysuria and frequent urination. EXAM: CT CHEST, ABDOMEN AND PELVIS WITHOUT CONTRAST TECHNIQUE: Multidetector CT imaging of the chest, abdomen and pelvis was performed following the standard protocol without IV contrast. COMPARISON:  Chest CT 10/22/2013 and abdominopelvic CT 02/11/2011 FINDINGS: CT CHEST FINDINGS Cardiovascular: Heart is normal size. Vascular structures are unremarkable. Mediastinum/Nodes: No evidence of mediastinal or hilar adenopathy. Remaining mediastinal structures are within normal. Lungs/Pleura: Lungs are adequately inflated and demonstrate focal airspace opacification of the posterior right lower lobe which may be due to atelectasis, infection and less likely neoplasm. 3 mm nodule over the left apex unchanged from 2015 small nodule over the lateral left upper lobe unchanged from 2015. Minimal scarring over the lingula. Airways are within normal. Musculoskeletal: Within normal. CT ABDOMEN PELVIS FINDINGS Hepatobiliary: Numerous hypodense masses throughout the liver new compared to the previous exams. Gallbladder and biliary tree are within normal. Pancreas: Within normal. Spleen: Within normal. Adrenals/Urinary Tract: Adrenal glands are normal. No left-sided kidney. Right kidney demonstrates a slightly abnormal orientation and more lateral position. Mild prominence of the right intrarenal collecting system although no renal stones. Mild prominence of the  right ureter without ureteral calculi. Bladder is within normal. Stomach/Bowel: Stomach and small bowel are within normal. Appendix is normal. Colon is within normal. Vascular/Lymphatic: Vascular structures are within normal. There are a few small periaortic lymph nodes as well as several small lymph nodes over the upper abdomen the region of the gastrohepatic ligament and celiac axis. Reproductive: 4.1 cm right ovarian cyst. Left ovary and uterus are unremarkable. Other: No free fluid or focal inflammatory change. Musculoskeletal: Within normal. IMPRESSION: Mild focal airspace opacification over the periphery of the right lower lobe which may be due to atelectasis or infection and less likely neoplasm. Recommend follow-up chest CT 3-4 weeks. Two subcentimeter left lung nodules stable since 2015. Numerous hypodense masses throughout the liver new compared to the previous exam is concerning for metastatic disease. Consider PET-CT versus tissue sampling. Mild increased number of small lymph nodes over the upper abdomen as described. Absent left kidney. Minimal prominence of the right intrarenal collecting system and ureter without stones or mass identified. 4.1 cm right ovarian cyst. Electronically Signed   By: Marin Olp M.D.   On: 05/11/2017 00:31   Dg Chest 2 View  Result  Date: 05/13/2017 CLINICAL DATA:  Right flank pain for 3 weeks. Worsening nausea, vomiting. EXAM: CHEST  2 VIEW COMPARISON:  05/11/2017 FINDINGS: There is no focal parenchymal opacity. There is no pleural effusion or pneumothorax. The heart and mediastinal contours are unremarkable. The osseous structures are unremarkable. IMPRESSION: No active cardiopulmonary disease. Electronically Signed   By: Kathreen Devoid   On: 05/13/2017 11:55   Ct Chest Wo Contrast  Result Date: 05/11/2017 CLINICAL DATA:  Productive cough with mild shortness-of-breath and right-sided pain with nausea, vomiting and diarrhea 2 weeks. Dysuria and frequent urination.  EXAM: CT CHEST, ABDOMEN AND PELVIS WITHOUT CONTRAST TECHNIQUE: Multidetector CT imaging of the chest, abdomen and pelvis was performed following the standard protocol without IV contrast. COMPARISON:  Chest CT 10/22/2013 and abdominopelvic CT 02/11/2011 FINDINGS: CT CHEST FINDINGS Cardiovascular: Heart is normal size. Vascular structures are unremarkable. Mediastinum/Nodes: No evidence of mediastinal or hilar adenopathy. Remaining mediastinal structures are within normal. Lungs/Pleura: Lungs are adequately inflated and demonstrate focal airspace opacification of the posterior right lower lobe which may be due to atelectasis, infection and less likely neoplasm. 3 mm nodule over the left apex unchanged from 2015 small nodule over the lateral left upper lobe unchanged from 2015. Minimal scarring over the lingula. Airways are within normal. Musculoskeletal: Within normal. CT ABDOMEN PELVIS FINDINGS Hepatobiliary: Numerous hypodense masses throughout the liver new compared to the previous exams. Gallbladder and biliary tree are within normal. Pancreas: Within normal. Spleen: Within normal. Adrenals/Urinary Tract: Adrenal glands are normal. No left-sided kidney. Right kidney demonstrates a slightly abnormal orientation and more lateral position. Mild prominence of the right intrarenal collecting system although no renal stones. Mild prominence of the right ureter without ureteral calculi. Bladder is within normal. Stomach/Bowel: Stomach and small bowel are within normal. Appendix is normal. Colon is within normal. Vascular/Lymphatic: Vascular structures are within normal. There are a few small periaortic lymph nodes as well as several small lymph nodes over the upper abdomen the region of the gastrohepatic ligament and celiac axis. Reproductive: 4.1 cm right ovarian cyst. Left ovary and uterus are unremarkable. Other: No free fluid or focal inflammatory change. Musculoskeletal: Within normal. IMPRESSION: Mild focal  airspace opacification over the periphery of the right lower lobe which may be due to atelectasis or infection and less likely neoplasm. Recommend follow-up chest CT 3-4 weeks. Two subcentimeter left lung nodules stable since 2015. Numerous hypodense masses throughout the liver new compared to the previous exam is concerning for metastatic disease. Consider PET-CT versus tissue sampling. Mild increased number of small lymph nodes over the upper abdomen as described. Absent left kidney. Minimal prominence of the right intrarenal collecting system and ureter without stones or mass identified. 4.1 cm right ovarian cyst. Electronically Signed   By: Marin Olp M.D.   On: 05/11/2017 00:31   Ct Angio Chest Pe W And/or Wo Contrast  Addendum Date: 06/09/2017   ADDENDUM REPORT: 06/09/2017 11:40 ADDENDUM: It must be noted that there is a degree of motion artifact associated with this study. It is quite possible that the area of concern for small incompletely obstructing pulmonary embolus in the left lower lobe represents motion artifact as opposed to actual pulmonary embolus. This finding is not seen convincingly on reformatted images and may indeed be artifactual. It is not felt that this equivocal focus of decreased attenuation is clinically significant, particularly in light of patient's overall clinical history. These results will be called to the ordering clinician or representative by the Radiologist Assistant, and communication  documented in the PACS or zVision Dashboard. Electronically Signed   By: Lowella Grip III M.D.   On: 06/09/2017 11:40   Result Date: 06/09/2017 CLINICAL DATA:  Chest pain and elevated D-dimer. History of liver carcinoma EXAM: CT ANGIOGRAPHY CHEST WITH CONTRAST TECHNIQUE: Multidetector CT imaging of the chest was performed using the standard protocol during bolus administration of intravenous contrast. Multiplanar CT image reconstructions and MIPs were obtained to evaluate the  vascular anatomy. CONTRAST:  100 mL Isovue 370 nonionic COMPARISON:  CT chest and abdomen May 10, 2017; chest radiograph May 13, 2017 FINDINGS: Cardiovascular: There are small incompletely obstructing pulmonary emboli in the left lower lobe pulmonary artery. No more central pulmonary emboli are evident. The right ventricle to left ventricle diameter ratio is less than 0.9, not indicative of right heart strain. There is no thoracic aortic aneurysm or dissection. Visualized great vessels appear unremarkable. Right and left common carotid arteries arise as a common trunk, an anatomic variant. Pericardium is not appreciably thickened. There is no pericardial effusion. Mediastinum/Nodes: Visualized thyroid is diminutive without focal lesion evident. There is no appreciable thoracic aortic adenopathy. No esophageal lesions are appreciable. Lungs/Pleura: There is a nodular opacity in the superior segment of the right lower lobe measuring 6 x 6 mm, appreciable on axial slice 53 series 7. There is a nearby nodular opacity on this same slice in the superior segment of the right lower lobe measuring 6 x 6 mm. There is an ill-defined opacity abutting the pleura in the superior segment of the left lower lobe seen on axial slice 45 series 7 measuring 6 x 6 mm. There is no edema or consolidation. There is slight bibasilar atelectasis. No pleural effusion or pleural thickening evident. Upper Abdomen: The liver is enlarged with widespread mass lesions, consistent with widespread metastatic disease to the liver. This appearance is better seen on venous phase imaging from most recent CT compared to arterial phase imaging on this current study. Visualized upper abdominal structures appear unremarkable. Musculoskeletal: There is upper thoracic levoscoliosis. There are no lytic or destructive bone lesions. There is subtle sclerosis in the T1 and T2 vertebral bodies, potentially representing sclerotic metastatic lesions. Review of  the MIP images confirms the above findings. IMPRESSION: 1. Small pulmonary emboli, incompletely obstructing, in the left lower lobe pulmonary artery. No more central pulmonary emboli identified. No pulmonary emboli elsewhere seen. No right heart strain evident. 2. Nodular opacities in both lower lobes measuring 6 mm. Given widespread liver metastases, small metastatic pulmonary nodular lesions must be of concern. No consolidation. No larger parenchymal lung mass lesions. 3.  No evident thoracic adenopathy. 4. Enlarged liver with widespread metastatic disease, better evident on venous phase imaging from recent prior CT as opposed to this arterial phase study. 5. Rather subtle sclerosis in the T1 and T2 vertebral bodies. Concern for early sclerotic metastatic disease in these areas. No lytic or destructive bony lesions. Critical Value/emergent results were called by telephone at the time of interpretation on 06/08/2017 at 11:54 am to Dr. Davonna Belling , who verbally acknowledged these results. Electronically Signed: By: Lowella Grip III M.D. On: 06/08/2017 11:55   Ct Abdomen Pelvis W Contrast  Result Date: 06/06/2017 CLINICAL DATA:  Abdominal distention, nausea, diarrhea, weakness, jaundice EXAM: CT ABDOMEN AND PELVIS WITH CONTRAST TECHNIQUE: Multidetector CT imaging of the abdomen and pelvis was performed using the standard protocol following bolus administration of intravenous contrast. CONTRAST:  12mL ISOVUE-300 IOPAMIDOL (ISOVUE-300) INJECTION 61% COMPARISON:  05/10/2017 FINDINGS: Lower chest: Mixed  cystic/ solid opacity in the posterior right lower lobe (series 5/ image 1), incompletely visualized. Hepatobiliary: Innumerable hepatic metastases throughout the liver. Gallbladder is unremarkable. No intrahepatic or extrahepatic ductal dilatation. Pancreas: Within normal limits. Spleen: Within normal limits. Adrenals/Urinary Tract: Adrenal glands are within normal limits. Cross fused renal ectopia.  No  hydronephrosis. Bladder is within normal limits. Stomach/Bowel: Stomach is within normal limits. No evidence of bowel obstruction. Normal appendix (series 3/ image 78). Vascular/Lymphatic: No evidence of abdominal aortic aneurysm. 16 mm short axis node along the porta hepatis (series 3/ image 36). Additional small upper abdominal lymph nodes. Reproductive: Uterus is within normal limits. Right ovary is mildly prominent. Left ovary is grossly unremarkable. Other: No abdominopelvic ascites. Musculoskeletal: Visualized osseous structures are within normal limits. IMPRESSION: Innumerable hepatic metastases, grossly unchanged. Associated prominent upper abdominal lymph nodes, including a 16 mm short axis node along the porta hepatis, suspicious for nodal metastasis. Mixed cystic/solid lesion in the posterior right lower lobe, incompletely visualized. The appearance on prior CT chest at least raises concern for possible primary bronchogenic neoplasm. Consider PET-CT and/or liver biopsy for further evaluation, as clinically warranted. Electronically Signed   By: Julian Hy M.D.   On: 06/06/2017 20:54   US Abdomen Limited Ruq  Result Date: 05/10/2017 CLINICAL DATA:  Abdominal pain with nausea and vomiting 2 weeks. EXAM: ULTRASOUND ABDOMEN LIMITED RIGHT UPPER QUADRANT COMPARISON:  Ultrasound 08/04/2016 and CT 02/11/2011 FINDINGS: Gallbladder: Gallbladder slightly contracted without stones or sludge. No adjacent free fluid. Negative sonographic Murphy's sign. Borderline wall thickening measuring 3.2 mm. Common bile duct: Diameter: 3.7 mm. Liver: Numerous hypoechoic liver masses some which have a target appearance as this finding is new compared to the prior exams and suggest metastatic disease. Portal vein is patent on color Doppler imaging with normal direction of blood flow towards the liver. IMPRESSION: No acute gallbladder disease. Numerous hypodense liver masses new since the previous exams concerning for  metastatic disease. Electronically Signed   By: Marin Olp M.D.   On: 05/10/2017 22:57    Labs:  CBC:  Recent Labs  05/15/17 0401 06/06/17 2039 06/08/17 0143 06/09/17 0300  WBC 13.0* 9.6 10.6* 11.9*  HGB 9.2* 12.4 13.9 12.8  HCT 28.5* 39.6 42.7 38.9  PLT 333 270 PLATELET CLUMPS NOTED ON SMEAR, UNABLE TO ESTIMATE 284    COAGS:  Recent Labs  05/13/17 1937 06/06/17 2039  INR 1.26 1.15  APTT 36  --     BMP:  Recent Labs  05/15/17 0401 06/06/17 2039 06/08/17 0143 06/09/17 0300  NA 139 131* 131* 131*  K 3.8 4.0 4.5 4.3  CL 104 93* 91* 97*  CO2 26 21* 17* 20*  GLUCOSE 111* 115* 130* 137*  BUN 9 13 27* 37*  CALCIUM 8.2* 9.1 9.4 9.2  CREATININE 0.98 1.08* 1.23* 1.27*  GFRNONAA >60 >60 53* 51*  GFRAA >60 >60 >60 59*    LIVER FUNCTION TESTS:  Recent Labs  05/13/17 1250 06/06/17 2039 06/08/17 0143 06/09/17 0300  BILITOT 2.2* 8.2* 8.2* 7.0*  AST 64* 102* 86* 103*  ALT 51 40 36 45  ALKPHOS 272* 319* 326* 278*  PROT 7.2 6.1* 6.9 6.9  ALBUMIN 2.4* 2.2* 2.5* 2.6*    TUMOR MARKERS: No results for input(s): AFPTM, CEA, CA199, CHROMGRNA in the last 8760 hours.  Assessment and Plan: 41 y.o. female with past medical history of hypertension, diabetes who was recently admitted to the hospital with right sided abdominal pain, nausea, vomiting, weight loss, night sweats. She  has no known history of cancer. Laboratory studies revealed elevated LFTs/ creatinine. Recent imaging studies have revealed innumerable hepatic metastases of unknown etiology with upper abdominal adenopathy and nodular opacities in both lower lobes of lung along with subtle sclerosis in T1 and T2 vertebral bodies. Request now received for image guided liver lesion biopsy for further evaluation. Imaging studies have been reviewed by Dr. Anselm Pancoast. Risks and benefits discussed with the patient/mother including, but not limited to bleeding, infection, damage to adjacent structures or low yield requiring  additional tests.All of the patient's questions were answered, patient is agreeable to proceed.Consent signed and in chart. Procedure tentatively scheduled for tomorrow afternoon.     Thank you for this interesting consult.  I greatly enjoyed meeting SAMANTHIA HOWLAND and look forward to participating in their care.  A copy of this report was sent to the requesting provider on this date.  Electronically Signed: D. Rowe Robert, PA-C 06/09/2017, 2:26 PM   I spent a total of  25 minutes   in face to face in clinical consultation, greater than 50% of which was counseling/coordinating care for image guided liver lesion biopsy

## 2017-06-09 NOTE — Progress Notes (Signed)
[facility] PROGRESS NOTE  Patricia Mcclain BZJ:[id number] DOB: 1976/01/20 DOA: 06/08/2017 PCP: Levin Erp, MD  Interim summary and HPI 41 y.o. female, w  Hypertension, Dm2, c/o right sided pain x 3 weeks.  + n/v, + decrease appetie.  Denies diarrhea, constipation, brbpr, black stool.  CT imaging 10/29   shows innumerable hepatic metastasis. Admitted for further evaluation and pain control.  Assessment/Plan: 1-abd pain, nausea/vomiting -appears to be associated with liver metastasis  -continue supportive care -follow IR for liver biopsy -oncology consulted and waiting for pathology to determine treatment offers.  2-acute on chronic renal failure: stage 2 at baseline -due to dehydration and continue use of nephrotoxic agents -will continue IVF's -continue holding nephrotoxic agents  3-HTN -BP soft on admission and stable now -will continue tenormin  -holding cozaar due to #2  4-thyroid disease -will continue synthroid   5-depression and anxiety -will continue zoloft and klonopin   6-diabetes mellitus with renal impairment  -will continue SSI -holding oral hypoglycemic agents while inpatient   7-intractable pain -continue PRN analgesics  8-obesity -Body mass index is 41.55 kg/m. -low calorie diet and weight loss discussed with patient.  Code Status: Full Family Communication: no family at bedside  Disposition Plan: discussed with radiology service who confirmed that findings on CTA was an artifact and no truthful PE, will stop heparin drip and switch to [place] prophylaxis heparin instead. Will follow rec's and timing for liver biopsy from IR and subsequently recommendations from oncology service. Continue IVF, PRN analgesics and supportive care.   Consultants:  IR (for liver biopsy)  Oncology   Procedures:  See below for x-ray reports.  Antibiotics:  None   HPI/Subjective: Afebrile, no CP, no SOB, no nausea, no vomiting. Patient reports some overall  improvement in her symptoms, but continue having abd pain and generalized weakness sensation.  Objective: Vitals:   06/08/17 2158 06/09/17 0535  BP: 117/84 117/86  Pulse: 91 93  Resp: 18 20  Temp: 97.9 F (36.6 C) 97.7 F (36.5 C)  SpO2: 94%     Intake/Output Summary (Last 24 hours) at 06/09/17 1348 Last data filed at 06/09/17 1159  Gross per 24 hour  Intake          1591.88 ml  Output              500 ml  Net          1091.88 ml   Filed Weights   06/08/17 1309 06/08/17 1500 06/09/17 0535  Weight: 113.4 kg (250 lb) 116 kg (255 lb 11.7 oz) 116.8 kg (257 lb 6.4 oz)    Exam:   General: afebrile, no nausea, no vomiting. Still with abd pain, but feeling better overall. Denies CP and SOB.  Cardiovascular: S1 and S2, no rubs, no gallops, no murmur  Respiratory: good air movement bilaterally, normal resp effort, good O2 sat on RA.  Abdomen: right side/flank discomfort and tense sensation on palpation; no guarding, positive BS  Musculoskeletal: no edema, no cyanosis, no clubbing   Data Reviewed: Basic Metabolic Panel:  Recent Labs Lab 06/06/17 2039 06/08/17 0143 06/09/17 0300  NA 131* 131* 131*  K 4.0 4.5 4.3  CL 93* 91* 97*  CO2 21* 17* 20*  GLUCOSE 115* 130* 137*  BUN 13 27* 37*  CREATININE 1.08* 1.23* 1.27*  CALCIUM 9.1 9.4 9.2   Liver Function Tests:  Recent Labs Lab 06/06/17 2039 06/08/17 0143 06/09/17 0300  AST 102* 86* 103*  ALT 40 36 45  ALKPHOS  319* 326* 278*  BILITOT 8.2* 8.2* 7.0*  PROT 6.1* 6.9 6.9  ALBUMIN 2.2* 2.5* 2.6*    Recent Labs Lab 06/06/17 2039  LIPASE 18    Recent Labs Lab 06/06/17 2039 06/08/17 0143  AMMONIA 42* 52*   CBC:  Recent Labs Lab 06/06/17 2039 06/08/17 0143 06/09/17 0300  WBC 9.6 10.6* 11.9*  NEUTROABS 6.9 7.4  --   HGB 12.4 13.9 12.8  HCT 39.6 42.7 38.9  MCV 76.2* 75.6* 75.1*  PLT 270 PLATELET CLUMPS NOTED ON SMEAR, UNABLE TO ESTIMATE 284   Cardiac Enzymes:  Recent Labs Lab 06/08/17 0801  06/08/17 1303 06/08/17 1920  TROPONINI 0.04* 0.03* 0.03*   CBG:  Recent Labs Lab 06/08/17 1209 06/08/17 1650 06/08/17 2157 06/09/17 0723 06/09/17 1210  GLUCAP 156* 215* 173* 142* 152*   Studies: Ct Angio Chest Pe W And/or Wo Contrast  Addendum Date: 06/09/2017   ADDENDUM REPORT: 06/09/2017 11:40 ADDENDUM: It must be noted that there is a degree of motion artifact associated with this study. It is quite possible that the area of concern for small incompletely obstructing pulmonary embolus in the left lower lobe represents motion artifact as opposed to actual pulmonary embolus. This finding is not seen convincingly on reformatted images and may indeed be artifactual. It is not felt that this equivocal focus of decreased attenuation is clinically significant, particularly in light of patient's overall clinical history. These results will be called to the ordering clinician or representative by the Radiologist Assistant, and communication documented in the PACS or zVision Dashboard. Electronically Signed   By: Lowella Grip III M.D.   On: 06/09/2017 11:40   Result Date: 06/09/2017 CLINICAL DATA:  Chest pain and elevated D-dimer. History of liver carcinoma EXAM: CT ANGIOGRAPHY CHEST WITH CONTRAST TECHNIQUE: Multidetector CT imaging of the chest was performed using the standard protocol during bolus administration of intravenous contrast. Multiplanar CT image reconstructions and MIPs were obtained to evaluate the vascular anatomy. CONTRAST:  100 mL Isovue 370 nonionic COMPARISON:  CT chest and abdomen May 10, 2017; chest radiograph May 13, 2017 FINDINGS: Cardiovascular: There are small incompletely obstructing pulmonary emboli in the left lower lobe pulmonary artery. No more central pulmonary emboli are evident. The right ventricle to left ventricle diameter ratio is less than 0.9, not indicative of right heart strain. There is no thoracic aortic aneurysm or dissection. Visualized great  vessels appear unremarkable. Right and left common carotid arteries arise as a common trunk, an anatomic variant. Pericardium is not appreciably thickened. There is no pericardial effusion. Mediastinum/Nodes: Visualized thyroid is diminutive without focal lesion evident. There is no appreciable thoracic aortic adenopathy. No esophageal lesions are appreciable. Lungs/Pleura: There is a nodular opacity in the superior segment of the right lower lobe measuring 6 x 6 mm, appreciable on axial slice 53 series 7. There is a nearby nodular opacity on this same slice in the superior segment of the right lower lobe measuring 6 x 6 mm. There is an ill-defined opacity abutting the pleura in the superior segment of the left lower lobe seen on axial slice 45 series 7 measuring 6 x 6 mm. There is no edema or consolidation. There is slight bibasilar atelectasis. No pleural effusion or pleural thickening evident. Upper Abdomen: The liver is enlarged with widespread mass lesions, consistent with widespread metastatic disease to the liver. This appearance is better seen on venous phase imaging from most recent CT compared to arterial phase imaging on this current study. Visualized upper abdominal structures appear  unremarkable. Musculoskeletal: There is upper thoracic levoscoliosis. There are no lytic or destructive bone lesions. There is subtle sclerosis in the T1 and T2 vertebral bodies, potentially representing sclerotic metastatic lesions. Review of the MIP images confirms the above findings. IMPRESSION: 1. Small pulmonary emboli, incompletely obstructing, in the left lower lobe pulmonary artery. No more central pulmonary emboli identified. No pulmonary emboli elsewhere seen. No right heart strain evident. 2. Nodular opacities in both lower lobes measuring 6 mm. Given widespread liver metastases, small metastatic pulmonary nodular lesions must be of concern. No consolidation. No larger parenchymal lung mass lesions. 3.  No evident  thoracic adenopathy. 4. Enlarged liver with widespread metastatic disease, better evident on venous phase imaging from recent prior CT as opposed to this arterial phase study. 5. Rather subtle sclerosis in the T1 and T2 vertebral bodies. Concern for early sclerotic metastatic disease in these areas. No lytic or destructive bony lesions. Critical Value/emergent results were called by telephone at the time of interpretation on 06/08/2017 at 11:54 am to Dr. Davonna Belling , who verbally acknowledged these results. Electronically Signed: By: Lowella Grip III M.D. On: 06/08/2017 11:55    Scheduled Meds: . atenolol  50 mg Oral Daily  . heparin subcutaneous  5,000 Units Subcutaneous Q8H  . insulin aspart  0-5 Units Subcutaneous QHS  . insulin aspart  0-9 Units Subcutaneous TID WC  . levothyroxine  200 mcg Oral QAC breakfast  . sertraline  200 mg Oral Daily    Time spent: 30 minutes    Barton Dubois  [facility] Pager [phone] If 7PM-7AM, please contact night-coverage at [email or web address], password [facility] 06/09/2017, 1:48 PM  LOS: 0 days

## 2017-06-09 NOTE — Progress Notes (Signed)
Brief Pharmacy note: hold Heparin for VTE prophylaxis until after liver biopsy planned for the afternoon of 11/2  Heparin 5000 units SQ q8 re-scheduled to 11/2 at [place], [place] PharmD Pager [phone] 06/09/2017, 3:15 PM

## 2017-06-10 ENCOUNTER — Inpatient Hospital Stay (HOSPITAL_COMMUNITY): Payer: Medicaid Other

## 2017-06-10 LAB — CBC WITH DIFFERENTIAL/PLATELET
BASOS ABS: 0.1 10*3/uL (ref 0.0–0.1)
Basophils Relative: 1 %
EOS ABS: 1 10*3/uL — AB (ref 0.0–0.7)
Eosinophils Relative: 9 %
HEMATOCRIT: 40.3 % (ref 36.0–46.0)
Hemoglobin: 13 g/dL (ref 12.0–15.0)
LYMPHS ABS: 2 10*3/uL (ref 0.7–4.0)
Lymphocytes Relative: 17 %
MCH: 24.4 pg — ABNORMAL LOW (ref 26.0–34.0)
MCHC: 32.3 g/dL (ref 30.0–36.0)
MCV: 75.6 fL — ABNORMAL LOW (ref 78.0–100.0)
Monocytes Absolute: 1.3 10*3/uL — ABNORMAL HIGH (ref 0.1–1.0)
Monocytes Relative: 11 %
NEUTROS ABS: 7.2 10*3/uL (ref 1.7–7.7)
Neutrophils Relative %: 62 %
Platelets: ADEQUATE 10*3/uL (ref 150–400)
RBC: 5.33 MIL/uL — ABNORMAL HIGH (ref 3.87–5.11)
RDW: 27 % — AB (ref 11.5–15.5)
WBC: 11.6 10*3/uL — ABNORMAL HIGH (ref 4.0–10.5)

## 2017-06-10 LAB — HEPATIC FUNCTION PANEL
ALBUMIN: 2.6 g/dL — AB (ref 3.5–5.0)
ALT: 44 U/L (ref 14–54)
AST: 93 U/L — ABNORMAL HIGH (ref 15–41)
Alkaline Phosphatase: 287 U/L — ABNORMAL HIGH (ref 38–126)
BILIRUBIN INDIRECT: 3 mg/dL — AB (ref 0.3–0.9)
BILIRUBIN TOTAL: 6.7 mg/dL — AB (ref 0.3–1.2)
Bilirubin, Direct: 3.7 mg/dL — ABNORMAL HIGH (ref 0.1–0.5)
TOTAL PROTEIN: 7.2 g/dL (ref 6.5–8.1)

## 2017-06-10 LAB — GLUCOSE, CAPILLARY
GLUCOSE-CAPILLARY: 126 mg/dL — AB (ref 65–99)
GLUCOSE-CAPILLARY: 99 mg/dL (ref 65–99)
Glucose-Capillary: 130 mg/dL — ABNORMAL HIGH (ref 65–99)
Glucose-Capillary: 125 mg/dL — ABNORMAL HIGH (ref 65–99)
Glucose-Capillary: 136 mg/dL — ABNORMAL HIGH (ref 65–99)

## 2017-06-10 LAB — BASIC METABOLIC PANEL
Anion gap: 14 (ref 5–15)
BUN: 36 mg/dL — AB (ref 6–20)
CALCIUM: 9.6 mg/dL (ref 8.9–10.3)
CO2: 20 mmol/L — ABNORMAL LOW (ref 22–32)
Chloride: 99 mmol/L — ABNORMAL LOW (ref 101–111)
Creatinine, Ser: 1.12 mg/dL — ABNORMAL HIGH (ref 0.44–1.00)
GFR calc Af Amer: >60 mL/min (ref >60)
GFR, EST NON AFRICAN AMERICAN: 60 mL/min — AB (ref >60)
GLUCOSE: 123 mg/dL — AB (ref 65–99)
Potassium: 4.8 mmol/L (ref 3.5–5.1)
Sodium: 133 mmol/L — ABNORMAL LOW (ref 135–145)

## 2017-06-10 MED ORDER — FENTANYL CITRATE (PF) 100 MCG/2ML IJ SOLN
INTRAMUSCULAR | Status: AC
  Filled 2017-06-10: qty 2

## 2017-06-10 MED ORDER — LIDOCAINE HCL 2 % IJ SOLN
INTRAMUSCULAR | Status: AC
  Filled 2017-06-10: qty 10

## 2017-06-10 MED ORDER — MIDAZOLAM HCL 2 MG/2ML IJ SOLN
INTRAMUSCULAR | Status: AC
  Filled 2017-06-10: qty 4

## 2017-06-10 NOTE — Progress Notes (Signed)
[facility] PROGRESS NOTE  Patricia Mcclain PXT:[id number] DOB: 21-Oct-1975 DOA: 06/08/2017 PCP: Levin Erp, MD  Interim summary and HPI 41 y.o. female, w  Hypertension, Dm2, c/o right sided pain x 3 weeks.  + n/v, + decrease appetie.  Denies diarrhea, constipation, brbpr, black stool.  CT imaging 10/29   shows innumerable hepatic metastasis. Admitted for further evaluation and pain control.  Assessment/Plan: 1-abd pain, nausea/vomiting -appears to be associated with liver metastasis  -continue supportive care and as needed analgesics -follow IR for liver biopsy; tentatively planned for later today. -oncology consulted and waiting for pathology to determine treatment offers.  2-acute on chronic renal failure: stage 2 at baseline -due to dehydration and continue use of nephrotoxic agents -Continue IV fluids adjust rate, renal function has improved and is essentially back to baseline.   -continue holding/minimizing the use of nephrotoxic agents  3-HTN -BP stable and well controlled. -continue tenormin  -Will continue holding cozaar due to #2  4-thyroid disease -Continue Synthroid  5-depression and anxiety -Continue Zoloft and klonopin   6-diabetes mellitus with renal impairment  -will continue SSI -Continue holding oral hypoglycemic agents while inpatient   7-intractable pain -continue PRN analgesics  8-obesity -Body mass index is 41.74 kg/m. -low calorie diet and weight loss discussed with patient.  Code Status: Full Family Communication: no family at bedside  Disposition Plan: discussed with radiology service who confirmed that findings on CTA was an artifact and no true PE; using heparin only for prophylaxis.  We will continue supportive care and will follow INR for liver biopsy.   Consultants:  IR (for liver biopsy)  Oncology   Procedures:  See below for x-ray reports.  Liver biopsy planned for later today.  Antibiotics:  None    HPI/Subjective: No fever, no nausea, no vomiting, no chest pain or shortness of breath.  Still complaining of intermittent right upper quadrant/right flank discomfort.  Hemodynamically stable.  Objective: Vitals:   06/10/17 1451 06/10/17 1539  BP: 110/82 120/88  Pulse: 82 81  Resp: 20 18  Temp: 97.8 F (36.6 C)   SpO2: 95% 95%    Intake/Output Summary (Last 24 hours) at 06/10/17 1756 Last data filed at 06/10/17 1638  Gross per 24 hour  Intake              240 ml  Output             2500 ml  Net            -2260 ml   Filed Weights   06/08/17 1500 06/09/17 0535 06/10/17 0452  Weight: 116 kg (255 lb 11.7 oz) 116.8 kg (257 lb 6.4 oz) 117.3 kg (258 lb 9.6 oz)    Exam:   General: No fever, no nausea, no vomiting.  Still with intermittent episodes of abdominal pain affecting her right upper quadrant.  Patient denies chest pain or shortness of breath.    Cardiovascular: S1 and S2, no rubs, no gallops, no murmur.    Respiratory: Good air movement bilaterally, normal respiratory effort, good oxygen saturation on room air and no using accessory muscles.  Abdomen: Right upper quadrant/right flank discomfort and tense sensation on palpation.  There is no guarding, positive bowel sounds.  Musculoskeletal: Trace edema bilaterally, no cyanosis, no clubbing no edema, no cyanosis, no clubbing   Data Reviewed: Basic Metabolic Panel:  Recent Labs Lab 06/06/17 2039 06/08/17 0143 06/09/17 0300 06/10/17 0502  NA 131* 131* 131* 133*  K 4.0 4.5 4.3 4.8  CL 93* 91* 97* 99*  CO2 21* 17* 20* 20*  GLUCOSE 115* 130* 137* 123*  BUN 13 27* 37* 36*  CREATININE 1.08* 1.23* 1.27* 1.12*  CALCIUM 9.1 9.4 9.2 9.6   Liver Function Tests:  Recent Labs Lab 06/06/17 2039 06/08/17 0143 06/09/17 0300 06/10/17 0502  AST 102* 86* 103* 93*  ALT 40 36 45 44  ALKPHOS 319* 326* 278* 287*  BILITOT 8.2* 8.2* 7.0* 6.7*  PROT 6.1* 6.9 6.9 7.2  ALBUMIN 2.2* 2.5* 2.6* 2.6*    Recent Labs Lab  06/06/17 2039  LIPASE 18    Recent Labs Lab 06/06/17 2039 06/08/17 0143  AMMONIA 42* 52*   CBC:  Recent Labs Lab 06/06/17 2039 06/08/17 0143 06/09/17 0300 06/10/17 0502  WBC 9.6 10.6* 11.9* 11.6*  NEUTROABS 6.9 7.4  --  7.2  HGB 12.4 13.9 12.8 13.0  HCT 39.6 42.7 38.9 40.3  MCV 76.2* 75.6* 75.1* 75.6*  PLT 270 PLATELET CLUMPS NOTED ON SMEAR, UNABLE TO ESTIMATE 284 PLATELET CLUMPS NOTED ON SMEAR, COUNT APPEARS ADEQUATE   Cardiac Enzymes:  Recent Labs Lab 06/08/17 0801 06/08/17 1303 06/08/17 1920  TROPONINI 0.04* 0.03* 0.03*   CBG:  Recent Labs Lab 06/09/17 1620 06/09/17 2128 06/10/17 0727 06/10/17 1202 06/10/17 1633  GLUCAP 120* 126* 130* 125* 99   Studies: No results found.  Scheduled Meds: . atenolol  50 mg Oral Daily  . heparin subcutaneous  5,000 Units Subcutaneous Q8H  . insulin aspart  0-5 Units Subcutaneous QHS  . insulin aspart  0-9 Units Subcutaneous TID WC  . levothyroxine  200 mcg Oral QAC breakfast  . lidocaine      . sertraline  200 mg Oral Daily    Time spent: 30 minutes    Barton Dubois  [facility] Pager [phone] If 7PM-7AM, please contact night-coverage at [email or web address], password [facility] 06/10/2017, 5:56 PM  LOS: 1 day

## 2017-06-10 NOTE — Progress Notes (Signed)
Patient ID: Patricia Mcclain, female   DOB: 1976/02/07, 41 y.o.   MRN: [id number] Patient was scheduled for US guided liver biopsy this afternoon.  Prior to the procedure, we realized that the INR/PT lab was 62 days old.  Due to abnormal liver function, I feel that we need a current INR prior to biopsy.  Due to the time of day, we will not have time to get an INR level and perform the biopsy today.  Therefore, we will plan for the liver biopsy on Monday morning.  Will plan to get updated labs the morning of the procedure.

## 2017-06-10 NOTE — Sedation Documentation (Signed)
Procedure cancelled due to patient not having a recent pt/inr

## 2017-06-11 LAB — GLUCOSE, CAPILLARY
GLUCOSE-CAPILLARY: 109 mg/dL — AB (ref 65–99)
GLUCOSE-CAPILLARY: 152 mg/dL — AB (ref 65–99)
Glucose-Capillary: 117 mg/dL — ABNORMAL HIGH (ref 65–99)
Glucose-Capillary: 111 mg/dL — ABNORMAL HIGH (ref 65–99)

## 2017-06-11 NOTE — Progress Notes (Signed)
[facility] PROGRESS NOTE  Patricia Mcclain [id number]:[id number] DOB: 03/06/1976 DOA: 06/08/2017 PCP: Patricia Erp, MD  Interim summary and HPI 41 y.o. female, w  Hypertension, Dm2, c/o right sided pain x 3 weeks.  + n/v, + decrease appetie.  Denies diarrhea, constipation, brbpr, black stool.  CT imaging 10/29   shows innumerable hepatic metastasis. Admitted for further evaluation and pain control.  Assessment/Plan: 1-abd pain, nausea/vomiting -appears to be associated with liver metastasis  -continue supportive care and as needed analgesics -follow IR for liver biopsy; tentatively planned for Monday now. -oncology consulted and waiting for pathology to determine treatment offers.  2-acute on chronic renal failure: stage 2 at baseline -due to dehydration and continue use of nephrotoxic agents -Continue adequate PO hydration, renal function has improved and is essentially back to baseline.   -continue holding/minimizing the use of nephrotoxic agents  3-HTN -BP has remained stable and well controlled.   -will continue tenormin  -Will continue holding cozaar due to #2  4-thyroid disease -Continue Synthroid  5-depression and anxiety -Continue Zoloft and klonopin   6-diabetes mellitus with renal impairment  -will continue SSI -Continue holding oral hypoglycemic agents while inpatient   7-intractable pain -continue PRN analgesics  8-obesity -Body mass index is 43.2 kg/m. -low calorie diet and weight loss discussed with patient.  Code Status: Full Family Communication: no family at bedside  Disposition Plan: Interventional radiology has moved patient's case to be done on Monday.  We will continue supportive care and pain management.  Consultants:  IR (for liver biopsy)  Oncology   Procedures:  See below for x-ray reports.  Liver biopsy planned for later today.  Antibiotics:  None   HPI/Subjective: No fever, no nausea, no vomiting.  Continued to have intermittent  episodes of right side abdominal discomfort.  Denies chest pain or shortness of breath  Objective: Vitals:   06/11/17 0547 06/11/17 1328  BP: 111/66 110/68  Pulse: 77 85  Resp: 20 14  Temp: (!) 97.5 F (36.4 C) 98.2 F (36.8 C)  SpO2: 93% 92%   No intake or output data in the 24 hours ending 06/11/17 1938 Filed Weights   06/09/17 0535 06/10/17 0452 06/11/17 0547  Weight: 116.8 kg (257 lb 6.4 oz) 117.3 kg (258 lb 9.6 oz) 121.4 kg (267 lb 10.2 oz)    Exam:   General: No overnight events.  No fever, no nausea, no vomiting, no chest pain, no shortness of breath.  Continued to have intermittent pain in her right side.  Cardiovascular: S1 and S2, no rubs, no gallops, no murmur.  No JVD appreciated on exam.    Respiratory: Clear to auscultation bilaterally  Abdomen: Right upper quadrant/right flank discomfort on palpation,  tense feeling sensation in her right upper side.  Positive bowel sounds   Musculoskeletal: Trace edema bilaterally, no cyanosis, no clubbing  Data Reviewed: Basic Metabolic Panel:  Recent Labs Lab 06/06/17 2039 06/08/17 0143 06/09/17 0300 06/10/17 0502  NA 131* 131* 131* 133*  K 4.0 4.5 4.3 4.8  CL 93* 91* 97* 99*  CO2 21* 17* 20* 20*  GLUCOSE 115* 130* 137* 123*  BUN 13 27* 37* 36*  CREATININE 1.08* 1.23* 1.27* 1.12*  CALCIUM 9.1 9.4 9.2 9.6   Liver Function Tests:  Recent Labs Lab 06/06/17 2039 06/08/17 0143 06/09/17 0300 06/10/17 0502  AST 102* 86* 103* 93*  ALT 40 36 45 44  ALKPHOS 319* 326* 278* 287*  BILITOT 8.2* 8.2* 7.0* 6.7*  PROT 6.1* 6.9 6.9 7.2  ALBUMIN 2.2* 2.5* 2.6* 2.6*    Recent Labs Lab 06/06/17 2039  LIPASE 18    Recent Labs Lab 06/06/17 2039 06/08/17 0143  AMMONIA 42* 52*   CBC:  Recent Labs Lab 06/06/17 2039 06/08/17 0143 06/09/17 0300 06/10/17 0502  WBC 9.6 10.6* 11.9* 11.6*  NEUTROABS 6.9 7.4  --  7.2  HGB 12.4 13.9 12.8 13.0  HCT 39.6 42.7 38.9 40.3  MCV 76.2* 75.6* 75.1* 75.6*  PLT 270  PLATELET CLUMPS NOTED ON SMEAR, UNABLE TO ESTIMATE 284 PLATELET CLUMPS NOTED ON SMEAR, COUNT APPEARS ADEQUATE   Cardiac Enzymes:  Recent Labs Lab 06/08/17 0801 06/08/17 1303 06/08/17 1920  TROPONINI 0.04* 0.03* 0.03*   CBG:  Recent Labs Lab 06/10/17 1633 06/10/17 2054 06/11/17 0725 06/11/17 1214 06/11/17 1656  GLUCAP 99 136* 109* 117* 111*   Studies: US Abdomen Limited  Result Date: 06/11/2017 CLINICAL DATA:  41 year old with liver lesions and scheduled for liver biopsy. Liver biopsy was postponed due to incomplete lab tests prior to the procedure. EXAM: ULTRASOUND ABDOMEN LIMITED RIGHT UPPER QUADRANT COMPARISON:  CT 06/06/2017 and 05/10/2017.  Ultrasound 05/10/2017 FINDINGS: Images of the liver were obtained. Liver parenchyma is the diffusely heterogeneous, particularly the right hepatic lobe. Heterogeneity is due to innumerable hypoechoic liver lesions. The number of liver lesions has increased since the exam on 05/10/2017. IMPRESSION: Innumerable hypoechoic lesions throughout the liver, particularly in the right hepatic lobe. Findings are compatible with metastatic disease. Evidence for liver disease progression since 05/10/2017. Electronically Signed   By: Patricia Mcclain M.D.   On: 06/11/2017 08:02    Scheduled Meds: . atenolol  50 mg Oral Daily  . heparin subcutaneous  5,000 Units Subcutaneous Q8H  . insulin aspart  0-5 Units Subcutaneous QHS  . insulin aspart  0-9 Units Subcutaneous TID WC  . levothyroxine  200 mcg Oral QAC breakfast  . sertraline  200 mg Oral Daily    Time spent: 25 minutes    Patricia Mcclain  [facility] Pager [phone] If 7PM-7AM, please contact night-coverage at [email or web address], password [facility] 06/11/2017, 7:38 PM  LOS: 2 days

## 2017-06-12 ENCOUNTER — Other Ambulatory Visit: Payer: Self-pay

## 2017-06-12 LAB — GLUCOSE, CAPILLARY
GLUCOSE-CAPILLARY: 110 mg/dL — AB (ref 65–99)
GLUCOSE-CAPILLARY: 108 mg/dL — AB (ref 65–99)
Glucose-Capillary: 191 mg/dL — ABNORMAL HIGH (ref 65–99)
Glucose-Capillary: 134 mg/dL — ABNORMAL HIGH (ref 65–99)
Glucose-Capillary: 111 mg/dL — ABNORMAL HIGH (ref 65–99)

## 2017-06-12 MED ORDER — DOCUSATE SODIUM 100 MG PO CAPS
100.0000 mg | ORAL_CAPSULE | Freq: Two times a day (BID) | ORAL | Status: DC
  Filled 2017-06-12 (×4): qty 1

## 2017-06-12 MED ORDER — POLYETHYLENE GLYCOL 3350 17 G PO PACK
17.0000 g | PACK | Freq: Every day | ORAL | Status: DC
  Filled 2017-06-12 (×2): qty 1

## 2017-06-12 MED ORDER — OXYCODONE HCL 5 MG PO TABS
7.5000 mg | ORAL_TABLET | Freq: Four times a day (QID) | ORAL | Status: DC | PRN
  Administered 2017-06-12: 7.5 mg via ORAL
  Filled 2017-06-12 (×2): qty 2

## 2017-06-12 NOTE — Progress Notes (Signed)
[facility] PROGRESS NOTE  Patricia Mcclain TKW:[id number] DOB: 03/19/1976 DOA: 06/08/2017 PCP: Levin Erp, MD  Interim summary and HPI 41 y.o. female, w  Hypertension, Dm2, c/o right sided pain x 3 weeks.  + n/v, + decrease appetie.  Denies diarrhea, constipation, brbpr, black stool.  CT imaging 10/29   shows innumerable hepatic metastasis. Admitted for further evaluation and pain control.  Assessment/Plan: 1-abd pain, nausea/vomiting -appears to be associated with liver metastasis  -continue supportive care and as needed analgesics (using now oxy IR 7.5 MG PRN) -follow IR for liver biopsy; tentatively planned for tomorrow 06/13/17. -NPO after midnight. -oncology consulted and waiting for pathology to determine treatment offers.  2-acute on chronic renal failure: stage 2 at baseline -due to dehydration and continue use of nephrotoxic agents -Patient encouraged to follow adequate PO hydration, renal function back to baseline.   -continue holding/minimizing the use of nephrotoxic agents  3-HTN -BP has remained stable and well controlled.   -will continue tenormin  -Will continue holding cozaar due to #2  4-thyroid disease -Continue Synthroid.    5-depression and anxiety -Continue Zoloft and klonopin  -Mood stable, no suicidal ideation or hallucinations.  6-diabetes mellitus with renal impairment  -will continue SSI -Continue holding oral hypoglycemic agents while inpatient   7-intractable pain -continue PRN analgesics -Will adjust oxycodone to 7.5 mg for better control  8-obesity -Body mass index is 41.49 kg/m. -low calorie diet and weight loss discussed with patient.  Code Status: Full Family Communication: no family at bedside  Disposition Plan: Interventional radiology has moved patient's case to be done on tomorrow 06/13/17.  Will continue supportive care and pain management.  Consultants:  IR (for liver biopsy)  Oncology   Procedures:  See below for  x-ray reports.  Liver biopsy planned for later today.  Antibiotics:  None   HPI/Subjective: No overnight events.  Patient denies chest pain, shortness of breath, nausea, vomiting.  Reports poor appetite and continued to have intermittent pain in her abdomen.  Objective: Vitals:   06/12/17 0858 06/12/17 1236  BP: 114/73 103/69  Pulse: 86 80  Resp:  16  Temp:  97.6 F (36.4 C)  SpO2:  95%    Intake/Output Summary (Last 24 hours) at 06/12/2017 1646 Last data filed at 06/12/2017 0900 Gross per 24 hour  Intake 120 ml  Output -  Net 120 ml   Filed Weights   06/10/17 0452 06/11/17 0547 06/12/17 0537  Weight: 117.3 kg (258 lb 9.6 oz) 121.4 kg (267 lb 10.2 oz) 116.6 kg (257 lb 0.9 oz)    Exam:   General: Afebrile, no chest pain, shortness of breath, no nausea, no vomiting.  Patient reports appetite is poor and continued to have pain in her abdomen.   Cardiovascular: S1 and S2, no rubs, no gallops, no murmur.  S1 and S2, no rubs, no gallops, no murmur.   Respiratory: Good air movement bilaterally, no wheezing, no crackles.    Abdomen: Right upper quadrant/right flank discomfort on palpation, positive bowel sounds, mild guarding.    Musculoskeletal: Trace edema bilaterally,  no cyanosis, no clubbing.    Data Reviewed: Basic Metabolic Panel: Recent Labs  Lab 06/06/17 2039 06/08/17 0143 06/09/17 0300 06/10/17 0502  NA 131* 131* 131* 133*  K 4.0 4.5 4.3 4.8  CL 93* 91* 97* 99*  CO2 21* 17* 20* 20*  GLUCOSE 115* 130* 137* 123*  BUN 13 27* 37* 36*  CREATININE 1.08* 1.23* 1.27* 1.12*  CALCIUM 9.1 9.4 9.2 9.6  Liver Function Tests: Recent Labs  Lab 06/06/17 2039 06/08/17 0143 06/09/17 0300 06/10/17 0502  AST 102* 86* 103* 93*  ALT 40 36 45 44  ALKPHOS 319* 326* 278* 287*  BILITOT 8.2* 8.2* 7.0* 6.7*  PROT 6.1* 6.9 6.9 7.2  ALBUMIN 2.2* 2.5* 2.6* 2.6*   Recent Labs  Lab 06/06/17 2039  LIPASE 18   Recent Labs  Lab 06/06/17 2039 06/08/17 0143  AMMONIA  42* 52*   CBC: Recent Labs  Lab 06/06/17 2039 06/08/17 0143 06/09/17 0300 06/10/17 0502  WBC 9.6 10.6* 11.9* 11.6*  NEUTROABS 6.9 7.4  --  7.2  HGB 12.4 13.9 12.8 13.0  HCT 39.6 42.7 38.9 40.3  MCV 76.2* 75.6* 75.1* 75.6*  PLT 270 PLATELET CLUMPS NOTED ON SMEAR, UNABLE TO ESTIMATE 284 PLATELET CLUMPS NOTED ON SMEAR, COUNT APPEARS ADEQUATE   Cardiac Enzymes: Recent Labs  Lab 06/08/17 0801 06/08/17 1303 06/08/17 1920  TROPONINI 0.04* 0.03* 0.03*   CBG: Recent Labs  Lab 06/11/17 1214 06/11/17 1656 06/11/17 2118 06/12/17 0820 06/12/17 1124  GLUCAP 117* 111* 152* 191* 110*   Studies: US Abdomen Limited  Result Date: 06/11/2017 CLINICAL DATA:  41 year old with liver lesions and scheduled for liver biopsy. Liver biopsy was postponed due to incomplete lab tests prior to the procedure. EXAM: ULTRASOUND ABDOMEN LIMITED RIGHT UPPER QUADRANT COMPARISON:  CT 06/06/2017 and 05/10/2017.  Ultrasound 05/10/2017 FINDINGS: Images of the liver were obtained. Liver parenchyma is the diffusely heterogeneous, particularly the right hepatic lobe. Heterogeneity is due to innumerable hypoechoic liver lesions. The number of liver lesions has increased since the exam on 05/10/2017. IMPRESSION: Innumerable hypoechoic lesions throughout the liver, particularly in the right hepatic lobe. Findings are compatible with metastatic disease. Evidence for liver disease progression since 05/10/2017. Electronically Signed   By: Markus Daft M.D.   On: 06/11/2017 08:02    Scheduled Meds: . atenolol  50 mg Oral Daily  . docusate sodium  100 mg Oral BID  . heparin subcutaneous  5,000 Units Subcutaneous Q8H  . insulin aspart  0-5 Units Subcutaneous QHS  . insulin aspart  0-9 Units Subcutaneous TID WC  . levothyroxine  200 mcg Oral QAC breakfast  . polyethylene glycol  17 g Oral Daily  . sertraline  200 mg Oral Daily    Time spent: 25 minutes    Barton Dubois  [facility] Pager [phone] If 7PM-7AM,  please contact night-coverage at [email or web address], password [facility] 06/12/2017, 4:46 PM  LOS: 3 days

## 2017-06-12 NOTE — Plan of Care (Signed)
Pain getting better, encourage patient to notify staff when in pain, to help keep pain under control. Encouraging patient to move around/walk and sit up in the chair.

## 2017-06-13 ENCOUNTER — Inpatient Hospital Stay (HOSPITAL_COMMUNITY): Payer: Medicaid Other

## 2017-06-13 LAB — CBC WITH DIFFERENTIAL/PLATELET
BASOS PCT: 1 %
Basophils Absolute: 0.1 10*3/uL (ref 0.0–0.1)
EOS PCT: 6 %
Eosinophils Absolute: 0.6 10*3/uL (ref 0.0–0.7)
HEMATOCRIT: 39.3 % (ref 36.0–46.0)
HEMOGLOBIN: 12.8 g/dL (ref 12.0–15.0)
LYMPHS PCT: 12 %
Lymphs Abs: 1.1 10*3/uL (ref 0.7–4.0)
MCH: 25.1 pg — AB (ref 26.0–34.0)
MCHC: 32.6 g/dL (ref 30.0–36.0)
MCV: 77.1 fL — AB (ref 78.0–100.0)
MONO ABS: 1.1 10*3/uL — AB (ref 0.1–1.0)
MONOS PCT: 12 %
NEUTROS PCT: 69 %
Neutro Abs: 6.6 10*3/uL (ref 1.7–7.7)
PLATELETS: ADEQUATE 10*3/uL (ref 150–400)
RBC: 5.1 MIL/uL (ref 3.87–5.11)
RDW: 27.9 % — ABNORMAL HIGH (ref 11.5–15.5)
WBC: 9.5 10*3/uL (ref 4.0–10.5)

## 2017-06-13 LAB — COMPREHENSIVE METABOLIC PANEL
ALK PHOS: 233 U/L — AB (ref 38–126)
ALT: 36 U/L (ref 14–54)
AST: 89 U/L — AB (ref 15–41)
Albumin: 2.4 g/dL — ABNORMAL LOW (ref 3.5–5.0)
Anion gap: 13 (ref 5–15)
BUN: 24 mg/dL — AB (ref 6–20)
CHLORIDE: 100 mmol/L — AB (ref 101–111)
CO2: 23 mmol/L (ref 22–32)
CREATININE: 0.78 mg/dL (ref 0.44–1.00)
Calcium: 9.4 mg/dL (ref 8.9–10.3)
GFR calc Af Amer: >60 mL/min (ref >60)
Glucose, Bld: 105 mg/dL — ABNORMAL HIGH (ref 65–99)
Potassium: 4.8 mmol/L (ref 3.5–5.1)
Sodium: 136 mmol/L (ref 135–145)
Total Bilirubin: 6 mg/dL — ABNORMAL HIGH (ref 0.3–1.2)
Total Protein: 6.5 g/dL (ref 6.5–8.1)

## 2017-06-13 LAB — GLUCOSE, CAPILLARY
GLUCOSE-CAPILLARY: 119 mg/dL — AB (ref 65–99)
Glucose-Capillary: 113 mg/dL — ABNORMAL HIGH (ref 65–99)
Glucose-Capillary: 124 mg/dL — ABNORMAL HIGH (ref 65–99)
Glucose-Capillary: 111 mg/dL — ABNORMAL HIGH (ref 65–99)

## 2017-06-13 LAB — PROTIME-INR
INR: 1.04
Prothrombin Time: 13.5 seconds (ref 11.4–15.2)

## 2017-06-13 MED ORDER — NALOXONE HCL 0.4 MG/ML IJ SOLN
INTRAMUSCULAR | Status: AC
  Filled 2017-06-13: qty 1

## 2017-06-13 MED ORDER — FENTANYL CITRATE (PF) 100 MCG/2ML IJ SOLN
INTRAMUSCULAR | Status: AC
  Filled 2017-06-13: qty 6

## 2017-06-13 MED ORDER — FLUMAZENIL 0.5 MG/5ML IV SOLN
INTRAVENOUS | Status: DC
Start: 2017-06-13 — End: 2017-06-13
  Filled 2017-06-13: qty 5

## 2017-06-13 MED ORDER — MIDAZOLAM HCL 2 MG/2ML IJ SOLN
INTRAMUSCULAR | Status: AC
  Filled 2017-06-13: qty 6

## 2017-06-13 MED ORDER — HYDROCODONE-ACETAMINOPHEN 5-325 MG PO TABS
1.0000 | ORAL_TABLET | ORAL | Status: DC | PRN
  Administered 2017-06-13 – 2017-06-14 (×2): 2 via ORAL
  Filled 2017-06-13 (×2): qty 2

## 2017-06-13 MED ORDER — LIDOCAINE HCL 2 % IJ SOLN
INTRAMUSCULAR | Status: AC
Start: 2017-06-13 — End: 2017-06-13
  Filled 2017-06-13: qty 10

## 2017-06-13 MED ORDER — MIDAZOLAM HCL 2 MG/2ML IJ SOLN
INTRAMUSCULAR | Status: AC | PRN
  Administered 2017-06-13 (×2): 1 mg via INTRAVENOUS

## 2017-06-13 MED ORDER — FENTANYL CITRATE (PF) 100 MCG/2ML IJ SOLN
INTRAMUSCULAR | Status: AC | PRN
  Administered 2017-06-13 (×2): 50 ug via INTRAVENOUS

## 2017-06-13 NOTE — Plan of Care (Signed)
  Progressing Health Behavior/Discharge Planning: Ability to manage health-related needs will improve 06/13/2017 0748 - Progressing by Carmela Hurt, RN Pain Managment: General experience of comfort will improve 06/13/2017 0748 - Progressing by Carmela Hurt, RN Physical Regulation: Ability to maintain clinical measurements within normal limits will improve 06/13/2017 0748 - Progressing by Carmela Hurt, RN Will remain free from infection 06/13/2017 0748 - Progressing by Carmela Hurt, RN Tissue Perfusion: Risk factors for ineffective tissue perfusion will decrease 06/13/2017 0748 - Progressing by Carmela Hurt, RN Activity: Risk for activity intolerance will decrease 06/13/2017 0748 - Progressing by Carmela Hurt, RN Fluid Volume: Ability to maintain a balanced intake and output will improve 06/13/2017 0748 - Progressing by Carmela Hurt, RN Nutrition: Adequate nutrition will be maintained 06/13/2017 0748 - Progressing by Carmela Hurt, RN Bowel/Gastric: Will not experience complications related to bowel motility 06/13/2017 0748 - Progressing by Carmela Hurt, RN

## 2017-06-13 NOTE — Sedation Documentation (Signed)
Patient denies pain and is resting comfortably.  

## 2017-06-13 NOTE — Progress Notes (Signed)
[facility] PROGRESS NOTE  Patricia Mcclain ZOX:[id number] DOB: [birth year] DOA: 06/08/2017 PCP: Levin Erp, MD  Interim summary and HPI 41 y.o. female, w  Hypertension, Dm2, c/o right sided pain x 3 weeks.  + n/v, + decrease appetie.  Denies diarrhea, constipation, brbpr, black stool.  CT imaging 10/29   shows innumerable hepatic metastasis. Admitted for further evaluation and pain control.  Assessment/Plan: 1-abd pain, nausea/vomiting -appears to be associated with liver metastasis  -continue supportive care and as needed analgesics (using now oxy IR 7.5 MG PRN) -Patient tolerated liver biopsy without problems or apparent complications. -Will assess her overnight for better control of her pain and if it stable most likely let her go home tomorrow with some home health services and outpatient follow-up with oncology service. -oncology waiting for pathology to determine treatment offers.  2-acute on chronic renal failure: stage 2 at baseline -due to dehydration and continue use of nephrotoxic agents -Patient encouraged to follow adequate PO hydration, renal function back to baseline.   -continue holding/minimizing the use of nephrotoxic agents  3-HTN -BP has remained stable and well controlled.   -will continue tenormin  -Will continue holding cozaar due to #2  4-thyroid disease -Continue Synthroid.    5-depression and anxiety -Continue Zoloft and klonopin  -Mood stable, no suicidal ideation or hallucinations.  6-diabetes mellitus with renal impairment  -will continue SSI -Continue holding oral hypoglycemic agents while inpatient   7-intractable pain -continue PRN analgesics -Will continue the use of oxycodone to 7.5 mg for better control -Minimize the usage of Tylenol with ongoing elevated liver function.  8-obesity -Body mass index is 40.71 kg/m. -low calorie diet and weight loss discussed with patient.  Code Status: Full Family Communication: no family at  bedside  Disposition Plan: Patient tolerated liver biopsy well.  Is still complaining of pain in her right side that is unrelieved with ongoing pain medication (even improved).  Pathology pending.  Consultants:  IR (for liver biopsy)  Oncology   Procedures:  See below for x-ray reports.  Liver biopsy 11/5  Antibiotics:  None   HPI/Subjective: No chest pain, no shortness of breath, no fever.  Patient denies nausea, vomiting.  She is feeling weak and with poor appetite.  Tolerated liver biopsy without any problems.  Objective: Vitals:   06/13/17 0851 06/13/17 1501  BP: 117/74 109/73  Pulse: 90 96  Resp: 14 15  Temp:  97.7 F (36.5 C)  SpO2: 90% 95%    Intake/Output Summary (Last 24 hours) at 06/13/2017 1935 Last data filed at 06/13/2017 1503 Gross per 24 hour  Intake 240 ml  Output -  Net 240 ml   Filed Weights   06/11/17 0547 06/12/17 0537 06/13/17 0443  Weight: 121.4 kg (267 lb 10.2 oz) 116.6 kg (257 lb 0.9 oz) 114.4 kg (252 lb 3.3 oz)    Exam:   General: No fever, no chest pain, no shortness of breath, no nausea or vomiting.  Patient continued to report poor appetite but is able to drink without any problems.  She feels weak.  Reported having a bowel movement overnight.  Cardiovascular: S1 and S2, no rubs, no gallops, no murmurs no JVD on exam.  Respiratory: There to auscultation bilaterally, normal respiratory effort, good oxygen saturation on room air.     Abdomen: Continue to have pain in the right upper quadrant/right flank on palpation, positive bowel sounds, mild distention and guarding appreciated in the affected section.   Musculoskeletal: Trace edema bilaterally, no cyanosis, no  clubbing.   Data Reviewed: Basic Metabolic Panel: Recent Labs  Lab 06/06/17 2039 06/08/17 0143 06/09/17 0300 06/10/17 0502 06/13/17 0536  NA 131* 131* 131* 133* 136  K 4.0 4.5 4.3 4.8 4.8  CL 93* 91* 97* 99* 100*  CO2 21* 17* 20* 20* 23  GLUCOSE 115* 130* 137*  123* 105*  BUN 13 27* 37* 36* 24*  CREATININE 1.08* 1.23* 1.27* 1.12* 0.78  CALCIUM 9.1 9.4 9.2 9.6 9.4   Liver Function Tests: Recent Labs  Lab 06/06/17 2039 06/08/17 0143 06/09/17 0300 06/10/17 0502 06/13/17 0536  AST 102* 86* 103* 93* 89*  ALT 40 36 45 44 36  ALKPHOS 319* 326* 278* 287* 233*  BILITOT 8.2* 8.2* 7.0* 6.7* 6.0*  PROT 6.1* 6.9 6.9 7.2 6.5  ALBUMIN 2.2* 2.5* 2.6* 2.6* 2.4*   Recent Labs  Lab 06/06/17 2039  LIPASE 18   Recent Labs  Lab 06/06/17 2039 06/08/17 0143  AMMONIA 42* 52*   CBC: Recent Labs  Lab 06/06/17 2039 06/08/17 0143 06/09/17 0300 06/10/17 0502 06/13/17 0536  WBC 9.6 10.6* 11.9* 11.6* 9.5  NEUTROABS 6.9 7.4  --  7.2 6.6  HGB 12.4 13.9 12.8 13.0 12.8  HCT 39.6 42.7 38.9 40.3 39.3  MCV 76.2* 75.6* 75.1* 75.6* 77.1*  PLT 270 PLATELET CLUMPS NOTED ON SMEAR, UNABLE TO ESTIMATE 284 PLATELET CLUMPS NOTED ON SMEAR, COUNT APPEARS ADEQUATE PLATELET CLUMPS NOTED ON SMEAR, COUNT APPEARS ADEQUATE   Cardiac Enzymes: Recent Labs  Lab 06/08/17 0801 06/08/17 1303 06/08/17 1920  TROPONINI 0.04* 0.03* 0.03*   CBG: Recent Labs  Lab 06/12/17 2101 06/12/17 2325 06/13/17 0724 06/13/17 1117 06/13/17 1626  GLUCAP 111* 108* 119* 113* 124*   Studies: US Biopsy (liver)  Result Date: 06/13/2017 CLINICAL DATA:  Multiple liver lesions consistent metastatic disease. No known primary. EXAM: ULTRASOUND GUIDED CORE BIOPSY OF LIVER LESION MEDICATIONS: Intravenous Fentanyl and Versed were administered as conscious sedation during continuous monitoring of the patient's level of consciousness and physiological / cardiorespiratory status by the radiology RN, with a total moderate sedation time of 10 minutes. PROCEDURE: The procedure, risks, benefits, and alternatives were explained to the patient. Questions regarding the procedure were encouraged and answered. The patient understands and consents to the procedure. Survey ultrasound of the liver was performed.  Representative lesions were localized and an appropriate skin entry site was determined and marked. The operative field was prepped with chlorhexidine in a sterile fashion, and a sterile drape was applied covering the operative field. A sterile gown and sterile gloves were used for the procedure. Local anesthesia was provided with 1% Lidocaine. Under real-time ultrasound guidance, a 17 gauge trocar needle was advanced to the margin of the lesion. Once needle tip position was confirmed, coaxial 18-gauge core biopsy samples were obtained, submitted in formalin to surgical pathology. The guide needle was removed. The patient tolerated the procedure well. COMPLICATIONS: None. FINDINGS: Innumerable hypoechoic liver lesions were localized, corresponding to findings on previous CT. Representative core biopsies obtained as above IMPRESSION: 1. Technically successful ultrasound-guided liver lesion core biopsy Electronically Signed   By: Lucrezia Europe M.D.   On: 06/13/2017 09:14    Scheduled Meds: . atenolol  50 mg Oral Daily  . docusate sodium  100 mg Oral BID  . fentaNYL      . heparin subcutaneous  5,000 Units Subcutaneous Q8H  . insulin aspart  0-5 Units Subcutaneous QHS  . insulin aspart  0-9 Units Subcutaneous TID WC  . levothyroxine  200 mcg Oral  QAC breakfast  . lidocaine      . midazolam      . polyethylene glycol  17 g Oral Daily  . sertraline  200 mg Oral Daily    Time spent: 25 minutes    Barton Dubois  [facility] Pager [phone] If 7PM-7AM, please contact night-coverage at [email or web address], password [facility] 06/13/2017, 7:35 PM  LOS: 4 days

## 2017-06-13 NOTE — Progress Notes (Signed)
MEDICATION-RELATED CONSULT NOTE   IR Procedure Consult - Anticoagulant/Antiplatelet PTA/Inpatient Med List Review by Pharmacist    Procedure: US liver lesion biopsy    Completed: 06/13/17 at ~0900  Post-Procedural bleeding risk per IR MD assessment:  standard  Antithrombotic medications on inpatient or PTA profile prior to procedure:    Heparin 5000 units SQ q8h  Recommended restart time per IR Post-Procedure Guidelines:   Day 0 (at least 6 hrs or at next standard dose interval)   Plan:     - resume heparin 5000 units SQ with next dosing interval at 2200 - pharmacy will sign off. Re-consult [place] if need further assistance  Dia Sitter, PharmD, BCPS 06/13/2017 1:21 PM

## 2017-06-13 NOTE — Discharge Instructions (Signed)
Liver Biopsy, Care After °These instructions give you information on caring for yourself after your procedure. Your doctor may also give you more specific instructions. Call your doctor if you have any problems or questions after your procedure. °Follow these instructions at home: °· Rest at home for 1-2 days or as told by your doctor. °· Have someone stay with you for at least 24 hours. °· Do not do these things in the first 24 hours: °? Drive. °? Use machinery. °? Take care of other people. °? Sign legal documents. °? Take a bath or shower. °· There are many different ways to close and cover a cut (incision). For example, a cut can be closed with stitches, skin glue, or adhesive strips. Follow your doctor's instructions on: °? Taking care of your cut. °? Changing and removing your bandage (dressing). °? Removing whatever was used to close your cut. °· Do not drink alcohol in the first week. °· Do not lift more than 5 pounds or play contact sports for the first 2 weeks. °· Take medicines only as told by your doctor. For 1 week, do not take medicine that has aspirin in it or medicines like ibuprofen. °· Get your test results. °Contact a doctor if: °· A cut bleeds and leaves more than just a small spot of blood. °· A cut is red, puffs up (swells), or hurts more than before. °· Fluid or something else comes from a cut. °· A cut smells bad. °· You have a fever or chills. °Get help right away if: °· You have swelling, bloating, or pain in your belly (abdomen). °· You get dizzy or faint. °· You have a rash. °· You feel sick to your stomach (nauseous) or throw up (vomit). °· You have trouble breathing, feel short of breath, or feel faint. °· Your chest hurts. °· You have problems talking or seeing. °· You have trouble balancing or moving your arms or legs. °This information is not intended to replace advice given to you by your health care provider. Make sure you discuss any questions you have with your health care  provider. °Document Released: 05/04/2008 Document Revised: 01/01/2016 Document Reviewed: 09/21/2013 °Elsevier Interactive Patient Education © 2018 [employer] ° ° ° ° °Moderate Conscious Sedation, Adult, Care After °These instructions provide you with information about caring for yourself after your procedure. Your health care provider may also give you more specific instructions. Your treatment has been planned according to current medical practices, but problems sometimes occur. Call your health care provider if you have any problems or questions after your procedure. °What can I expect after the procedure? °After your procedure, it is common: °· To feel sleepy for several hours. °· To feel clumsy and have poor balance for several hours. °· To have poor judgment for several hours. °· To vomit if you eat too soon. ° °Follow these instructions at home: °For at least 24 hours after the procedure: ° °· Do not: °? Participate in activities where you could fall or become injured. °? Drive. °? Use heavy machinery. °? Drink alcohol. °? Take sleeping pills or medicines that cause drowsiness. °? Make important decisions or sign legal documents. °? Take care of children on your own. °· Rest. °Eating and drinking °· Follow the diet recommended by your health care provider. °· If you vomit: °? Drink water, juice, or soup when you can drink without vomiting. °? Make sure you have little or no nausea before eating solid foods. °General instructions °·   Have a responsible adult stay with you until you are awake and alert. °· Take over-the-counter and prescription medicines only as told by your health care provider. °· If you smoke, do not smoke without supervision. °· Keep all follow-up visits as told by your health care provider. This is important. °Contact a health care provider if: °· You keep feeling nauseous or you keep vomiting. °· You feel light-headed. °· You develop a rash. °· You have a fever. °Get help right away  if: °· You have trouble breathing. °This information is not intended to replace advice given to you by your health care provider. Make sure you discuss any questions you have with your health care provider. °Document Released: 05/16/2013 Document Revised: 12/29/2015 Document Reviewed: 11/15/2015 °Elsevier Interactive Patient Education © 2018 [employer] Inc[employer] ° ° °

## 2017-06-13 NOTE — Procedures (Signed)
  Procedure: US liver lesion biopsy 18g x3 to surg path Preprocedure diagnosis: Liver mets Postprocedure diagnosis: same EBL:   minimal Complications:  none immediate  See full dictation in [employer].  Dillard Cannon MD Main # [phone] Pager  [phone]

## 2017-06-13 NOTE — Plan of Care (Signed)
  Health Behavior/Discharge Planning: Ability to manage health-related needs will improve 06/13/2017 0622 - Progressing by Ashley Murrain, RN 06/13/2017 0622 - Progressing by Ashley Murrain, RN    Physical Regulation: Ability to maintain clinical measurements within normal limits will improve 06/13/2017 0622 - Progressing by Ashley Murrain, RN 06/13/2017 0622 - Progressing by Ashley Murrain, RN

## 2017-06-14 DIAGNOSIS — R109 Unspecified abdominal pain: Secondary | ICD-10-CM

## 2017-06-14 DIAGNOSIS — F32A Depression, unspecified: Secondary | ICD-10-CM

## 2017-06-14 DIAGNOSIS — E860 Dehydration: Secondary | ICD-10-CM

## 2017-06-14 DIAGNOSIS — K5900 Constipation, unspecified: Secondary | ICD-10-CM

## 2017-06-14 DIAGNOSIS — C787 Secondary malignant neoplasm of liver and intrahepatic bile duct: Principal | ICD-10-CM

## 2017-06-14 DIAGNOSIS — C799 Secondary malignant neoplasm of unspecified site: Secondary | ICD-10-CM

## 2017-06-14 DIAGNOSIS — F329 Major depressive disorder, single episode, unspecified: Secondary | ICD-10-CM

## 2017-06-14 DIAGNOSIS — R1011 Right upper quadrant pain: Secondary | ICD-10-CM

## 2017-06-14 DIAGNOSIS — G893 Neoplasm related pain (acute) (chronic): Secondary | ICD-10-CM

## 2017-06-14 LAB — GLUCOSE, CAPILLARY
GLUCOSE-CAPILLARY: 134 mg/dL — AB (ref 65–99)
Glucose-Capillary: 114 mg/dL — ABNORMAL HIGH (ref 65–99)

## 2017-06-14 MED ORDER — DOCUSATE SODIUM 100 MG PO CAPS: 100.0000 mg | ORAL_CAPSULE | Freq: Two times a day (BID) | ORAL | 0 refills | Status: AC

## 2017-06-14 MED ORDER — HYDROCODONE-ACETAMINOPHEN 5-325 MG PO TABS: 1.0000 | ORAL_TABLET | Freq: Four times a day (QID) | ORAL | 0 refills | Status: AC | PRN

## 2017-06-14 MED ORDER — LOSARTAN POTASSIUM 25 MG PO TABS: 25.0000 mg | ORAL_TABLET | Freq: Every day | ORAL | 1 refills | Status: AC

## 2017-06-14 NOTE — Plan of Care (Signed)
  Progressing Health Behavior/Discharge Planning: Ability to manage health-related needs will improve 06/14/2017 0013 - Progressing by Talbert Forest, RN Pain Managment: General experience of comfort will improve 06/14/2017 0013 - Progressing by Talbert Forest, RN 06/14/2017 0011 - Progressing by Talbert Forest, RN Fluid Volume: Ability to maintain a balanced intake and output will improve 06/14/2017 0013 - Progressing by Talbert Forest, RN 06/14/2017 0011 - Progressing by Talbert Forest, RN Nutrition: Adequate nutrition will be maintained 06/14/2017 0013 - Progressing by Talbert Forest, RN

## 2017-06-14 NOTE — Progress Notes (Signed)
Patient suffers from cancer, pain and LLE weakness which impairs their ability to perform daily activities like walking in the home.  A walker alone will not resolve the issues with performing activities of daily living. A wheelchair will allow patient to safely perform daily activities.  The patient can self propel in the home or has a caregiver who can provide assistance.     Blondell Reveal Kistler PT 06/14/2017  [phone]

## 2017-06-14 NOTE — Evaluation (Signed)
Physical Therapy Evaluation Patient Details Name: DEMETRIS MEINHARDT MRN: [id number] DOB: 25-Sep-1975 Today's Date: 06/14/2017   History of Present Illness  41 y.o.female,w Hypertension, Dm2, c/o right sided pain x 3 weeks. + n/v, + decrease appetie. Denies diarrhea, constipation, brbpr, black stool. CT imaging 10/29 shows innumerable hepatic metastasis. Admitted for further evaluation and pain control.  Clinical Impression  Pt able to transfer from bed to bedside commode with min/guard assist. She was unable to ambulate due to fatigue and dizziness in standing. For the past 2-3 months, she's only been able to walk ~ 80' with assistance from her mother. WC and 3 in 1 recommended for home.      Follow Up Recommendations Home health PT    Equipment Recommendations  Wheelchair (measurements PT);Wheelchair cushion (measurements PT);3in1 (PT)    Recommendations for Other Services       Precautions / Restrictions Precautions Precautions: Fall Precaution Comments: denies falls in past 1 year Restrictions Weight Bearing Restrictions: No      Mobility  Bed Mobility Overal bed mobility: Modified Independent             General bed mobility comments: HOB up 35*, used rail  Transfers Overall transfer level: Needs assistance Equipment used: Rolling walker (2 wheeled) Transfers: Sit to/from [facility] Sit to Stand: From elevated surface Stand pivot transfers: Min guard       General transfer comment: VCs hand placement, sit to stand labored from bed in low position, pt able to stand without assist  with bed elevated; performed SPT x 2. Pt then stood with RW for ~20 seconds but then had to sit 2* fatigue and dizziness, she was unable to ambulate.   Ambulation/Gait             General Gait Details: unable  Stairs            Wheelchair Mobility    Modified Rankin (Stroke Patients Only)       Balance Overall balance assessment: Needs  assistance Sitting-balance support: Feet supported;No upper extremity supported Sitting balance-Leahy Scale: Good       Standing balance-Leahy Scale: Fair                               Pertinent Vitals/Pain Pain Assessment: 0-10 Pain Score: 10-Worst pain ever Pain Location: abdomen Pain Descriptors / Indicators: Aching Pain Intervention(s): Limited activity within patient's tolerance;Monitored during session;Patient requesting pain meds-RN notified    Home Living Family/patient expects to be discharged to:: Private residence Living Arrangements: Parent;Other relatives Available Help at Discharge: Family;Available 24 hours/day   Home Access: Level entry     Home Layout: One level Home Equipment: Walker - 4 wheels      Prior Function Level of Independence: Needs assistance   Gait / Transfers Assistance Needed: has only walked short distances (~15') to bathroom with assist from her motherfor past 2-3 months due to weakness, no falls in past 1 year  ADL's / Homemaking Assistance Needed: mother assists with bathing/dressing, puts plastic chair in tub  Comments: would like BSC     Hand Dominance        Extremity/Trunk Assessment   Upper Extremity Assessment Upper Extremity Assessment: Overall WFL for tasks assessed    Lower Extremity Assessment Lower Extremity Assessment: LLE deficits/detail LLE Deficits / Details: knee ext -3/5, hip flexion +2/5, sensation intact to light touch    Cervical / Trunk Assessment Cervical / Trunk  Assessment: Normal  Communication   Communication: No difficulties  Cognition Arousal/Alertness: Awake/alert Behavior During Therapy: WFL for tasks assessed/performed Overall Cognitive Status: Within Functional Limits for tasks assessed                                        General Comments      Exercises     Assessment/Plan    PT Assessment All further PT needs can be met in the next venue of care   PT Problem List Decreased mobility;Decreased strength;Pain       PT Treatment Interventions      PT Goals (Current goals can be found in the Care Plan section)  Acute Rehab PT Goals PT Goal Formulation: All assessment and education complete, DC therapy(pt to DC home today)    Frequency     Barriers to discharge        Co-evaluation               AM-PAC PT "6 Clicks" Daily Activity  Outcome Measure Difficulty turning over in bed (including adjusting bedclothes, sheets and blankets)?: None Difficulty moving from lying on back to sitting on the side of the bed? : A Little Difficulty sitting down on and standing up from a chair with arms (e.g., wheelchair, bedside commode, etc,.)?: A Little Help needed moving to and from a bed to chair (including a wheelchair)?: A Little Help needed walking in hospital room?: Total Help needed climbing 3-5 steps with a railing? : Total 6 Click Score: 15    End of Session Equipment Utilized During Treatment: Gait belt Activity Tolerance: Patient limited by fatigue;Patient limited by pain   Nurse Communication: Mobility status PT Visit Diagnosis: Muscle weakness (generalized) (M62.81);Pain Pain - part of body: (abdomen)    Time: 3149-7026 PT Time Calculation (min) (ACUTE ONLY): 36 min   Charges:   PT Evaluation $PT Eval Low Complexity: 1 Low PT Treatments $Therapeutic Activity: 8-22 mins   PT G Codes:          Philomena Doheny 06/14/2017, 12:41 PM [phone]

## 2017-06-14 NOTE — Discharge Summary (Signed)
Physician Discharge Summary  Patricia Mcclain NAT:[id number] DOB: 12-12-1975 DOA: 06/08/2017  PCP: Levin Erp, MD  Admit date: 06/08/2017 Discharge date: 06/14/2017  Time spent: 35 minutes  Recommendations for Outpatient Follow-up:  Repeat BMET to follow electrolytes and renal function Reassess BP and adjust antihypertensive regimen  Repeat LFT's to follow trend  Patient to follow up with oncology service as instructed to further decide (once pathology available) options and type of treatment.  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 Discharge home with oral analgesic regimen and appointment to follow up with oncology service (Dr. Alvy Bimler).  Diet recommendation: heart healthy diet.  Filed Weights   06/12/17 0537 06/13/17 0443 06/14/17 0500  Weight: 116.6 kg (257 lb 0.9 oz) 114.4 kg (252 lb 3.3 oz) 113.6 kg (250 lb 7.1 oz)    History of present illness:  41 y.o.female,w Hypertension, Dm2, c/o right sided pain x 3 weeks. + n/v, + decrease appetie. Denies diarrhea, constipation, brbpr, black stool. CT imaging 10/29 shows innumerable hepatic metastasis. Admitted for further evaluation and pain control.  Hospital Course:  1-abd pain, nausea/vomiting -appears to be associated with liver metastasis  -continue supportive care and as needed analgesics (using now hydrocodone as per patient preference 5-10 mg as needed for pain) -no further nausea or vomiting -Patient tolerated liver biopsy without problems or complications. -Will discharge home with [facility] services -oncology waiting for pathology to determine treatment offers (appointment tentatively set up for 11/8 or 11/9 with Dr. Alvy Bimler).  2-acute on chronic renal failure: stage 2  at baseline -due to dehydration and continue use of nephrotoxic agents -Patient encouraged to follow adequate PO hydration, renal function back to baseline.    3-HTN -BP has remained stable and is rising at discharge. -will continue tenormin and will resume cozaar at lower dose -reassess BP and BMET at discharge.  4-thyroid disease -Continue Synthroid.    5-depression and anxiety -Continue Zoloft and klonopin  -Mood stable, no suicidal ideation or hallucinations.  6-diabetes mellitus with renal impairment  -will resume oral hypoglycemic regimen -advise to monitor carbohydrates intake  7-intractable pain -continue PRN analgesics -Will continue the use hydrocodone as per patient preference -Minimize the usage of extra Tylenol with ongoing elevated liver function. -Bowel regimen also initiated to minimize chances of constipation.  8-obesity -Body mass index is 40.71 kg/m. -low calorie/healthy diet and weight loss discussed with patient.   Procedures:  Liver biopsy 06/13/17  See below for x-ray reports  Consultations:  IR  Oncology   Discharge Exam: Vitals:   06/13/17 2027 06/14/17 0608  BP: 140/80 122/64  Pulse: 94 93  Resp: 15 20  Temp: 97.9 F (36.6 C) (!) 97.5 F (36.4 C)  SpO2: 94% 95%    General: No fever, no chest pain, no shortness of breath, no nausea or vomiting.  Patient continued to report poor appetite but is able to drink and eat some without any problems.  She feels weak.  Reported having a bowel movement overnight.  Cardiovascular: S1 and S2, no rubs, no gallops, no murmurs no JVD on exam.  Respiratory: There to auscultation bilaterally, normal respiratory effort, good oxygen saturation on room air.     Abdomen: Continue to have pain in the right upper quadrant/right flank on palpation, positive bowel sounds, mild  distention and guarding appreciated in the affected section.   Musculoskeletal: Trace edema bilaterally, no cyanosis, no  clubbing.    Discharge Instructions   Discharge Instructions    Diet - low sodium heart healthy   Complete by:  As directed    Discharge instructions   Complete by:  As directed    Take medications as prescribed Keep yourself well hydrated Please maintain adequate hydration Follow up with oncology service (Dr. Alvy Bimler, [place]); office will contact you for appointment details.     Current Discharge Medication List    START taking these medications   Details  docusate sodium (COLACE) 100 MG capsule Take 1 capsule (100 mg total) 2 (two) times daily by mouth. Qty: 60 capsule, Refills: 0    HYDROcodone-acetaminophen (NORCO/VICODIN) 5-325 MG tablet Take 1-2 tablets every 6 (six) hours as needed by mouth for severe pain. Qty: 40 tablet, Refills: 0      CONTINUE these medications which have CHANGED   Details  losartan (COZAAR) 25 MG tablet Take 1 tablet (25 mg total) daily by mouth. Qty: 30 tablet, Refills: 1      CONTINUE these medications which have NOT CHANGED   Details  acetaminophen (TYLENOL) 325 MG tablet Take 2 tablets (650 mg total) by mouth every 6 (six) hours as needed (for pain, fever above 101 and Headache.). Qty: 50 tablet, Refills: 0    atenolol (TENORMIN) 50 MG tablet Take 50 mg by mouth daily.    clonazePAM (KLONOPIN) 0.5 MG tablet Take 0.5 mg by mouth 3 (three) times daily as needed for anxiety.     insulin detemir (LEVEMIR) 100 unit/ml SOLN Inject 0.3 mLs (30 Units total) into the skin at bedtime. Qty: 10 mL, Refills: 1    levothyroxine (SYNTHROID, LEVOTHROID) 200 MCG tablet Take 200 mcg by mouth daily before breakfast. Take with 50mg  tablet for total dose of = 250mg     ondansetron (ZOFRAN) 4 MG tablet Take 1 tablet (4 mg total) by mouth every 6 (six) hours as needed for nausea or vomiting. Qty: 12 tablet, Refills: 0    sertraline (ZOLOFT) 100 MG tablet Take 200 mg by mouth daily.     sitaGLIPtin (JANUVIA) 100 MG tablet Take 100 mg by mouth daily.     Insulin Pen Needle 31G X 5 MM MISC Use as needed to inject insulin daily as instructed Qty: 100 each, Refills: 0    metoCLOPramide (REGLAN) 10 MG tablet Take 1 tablet (10 mg total) by mouth every 6 (six) hours as needed for nausea. Qty: 28 tablet, Refills: 0      STOP taking these medications     oxyCODONE (ROXICODONE) 5 MG immediate release tablet        Allergies  Allergen Reactions  . Oxycodone Other (See Comments)    Pt stated she hallucinated from the oxycodone   Follow-up [facility] Follow up.   Why:  HH nursing, social worker Contact information: [address] [place] [address] [phone]        Levin Erp, MD. Schedule an appointment as soon as possible for a visit in 3 day(s).   Specialty:  Internal Medicine Why:  within 1 week Contact information: [address] [place] [address] [place] [address] [phone]        [facility] - Dme Follow up.   Why:  Research scientist (physical sciences) information: [address] [place] [address] [phone]  Heath Lark, MD Follow up.   Specialty:  Hematology and Oncology Why:  office will contact you with appointment details for visit on Thursday or Friday this week. Contact information: [place] [address] [phone]            The results of significant diagnostics from this hospitalization (including imaging, microbiology, ancillary and laboratory) are listed below for reference.    Significant Diagnostic Studies: Ct Angio Chest Pe W And/or Wo Contrast  Addendum Date: 06/09/2017   ADDENDUM REPORT: 06/09/2017 11:40 ADDENDUM: It must be noted that there is a degree of motion artifact associated with this study. It is quite possible that the area of concern for small incompletely obstructing pulmonary embolus in the left lower lobe represents motion artifact as opposed to actual pulmonary embolus.  This finding is not seen convincingly on reformatted images and may indeed be artifactual. It is not felt that this equivocal focus of decreased attenuation is clinically significant, particularly in light of patient's overall clinical history. These results will be called to the ordering clinician or representative by the Radiologist Assistant, and communication documented in the PACS or zVision Dashboard. Electronically Signed   By: Lowella Grip III M.D.   On: 06/09/2017 11:40   Result Date: 06/09/2017 CLINICAL DATA:  Chest pain and elevated D-dimer. History of liver carcinoma EXAM: CT ANGIOGRAPHY CHEST WITH CONTRAST TECHNIQUE: Multidetector CT imaging of the chest was performed using the standard protocol during bolus administration of intravenous contrast. Multiplanar CT image reconstructions and MIPs were obtained to evaluate the vascular anatomy. CONTRAST:  100 mL Isovue 370 nonionic COMPARISON:  CT chest and abdomen May 10, 2017; chest radiograph May 13, 2017 FINDINGS: Cardiovascular: There are small incompletely obstructing pulmonary emboli in the left lower lobe pulmonary artery. No more central pulmonary emboli are evident. The right ventricle to left ventricle diameter ratio is less than 0.9, not indicative of right heart strain. There is no thoracic aortic aneurysm or dissection. Visualized great vessels appear unremarkable. Right and left common carotid arteries arise as a common trunk, an anatomic variant. Pericardium is not appreciably thickened. There is no pericardial effusion. Mediastinum/Nodes: Visualized thyroid is diminutive without focal lesion evident. There is no appreciable thoracic aortic adenopathy. No esophageal lesions are appreciable. Lungs/Pleura: There is a nodular opacity in the superior segment of the right lower lobe measuring 6 x 6 mm, appreciable on axial slice 53 series 7. There is a nearby nodular opacity on this same slice in the superior segment of the right  lower lobe measuring 6 x 6 mm. There is an ill-defined opacity abutting the pleura in the superior segment of the left lower lobe seen on axial slice 45 series 7 measuring 6 x 6 mm. There is no edema or consolidation. There is slight bibasilar atelectasis. No pleural effusion or pleural thickening evident. Upper Abdomen: The liver is enlarged with widespread mass lesions, consistent with widespread metastatic disease to the liver. This appearance is better seen on venous phase imaging from most recent CT compared to arterial phase imaging on this current study. Visualized upper abdominal structures appear unremarkable. Musculoskeletal: There is upper thoracic levoscoliosis. There are no lytic or destructive bone lesions. There is subtle sclerosis in the T1 and T2 vertebral bodies, potentially representing sclerotic metastatic lesions. Review of the MIP images confirms the above findings. IMPRESSION: 1. Small pulmonary emboli, incompletely obstructing, in the left lower lobe pulmonary artery. No more central pulmonary emboli identified. No pulmonary emboli elsewhere seen. No  right heart strain evident. 2. Nodular opacities in both lower lobes measuring 6 mm. Given widespread liver metastases, small metastatic pulmonary nodular lesions must be of concern. No consolidation. No larger parenchymal lung mass lesions. 3.  No evident thoracic adenopathy. 4. Enlarged liver with widespread metastatic disease, better evident on venous phase imaging from recent prior CT as opposed to this arterial phase study. 5. Rather subtle sclerosis in the T1 and T2 vertebral bodies. Concern for early sclerotic metastatic disease in these areas. No lytic or destructive bony lesions. Critical Value/emergent results were called by telephone at the time of interpretation on 06/08/2017 at 11:54 am to Dr. Davonna Belling , who verbally acknowledged these results. Electronically Signed: By: Lowella Grip III M.D. On: 06/08/2017 11:55   Ct  Abdomen Pelvis W Contrast  Result Date: 06/06/2017 CLINICAL DATA:  Abdominal distention, nausea, diarrhea, weakness, jaundice EXAM: CT ABDOMEN AND PELVIS WITH CONTRAST TECHNIQUE: Multidetector CT imaging of the abdomen and pelvis was performed using the standard protocol following bolus administration of intravenous contrast. CONTRAST:  159mL ISOVUE-300 IOPAMIDOL (ISOVUE-300) INJECTION 61% COMPARISON:  05/10/2017 FINDINGS: Lower chest: Mixed cystic/ solid opacity in the posterior right lower lobe (series 5/ image 1), incompletely visualized. Hepatobiliary: Innumerable hepatic metastases throughout the liver. Gallbladder is unremarkable. No intrahepatic or extrahepatic ductal dilatation. Pancreas: Within normal limits. Spleen: Within normal limits. Adrenals/Urinary Tract: Adrenal glands are within normal limits. Cross fused renal ectopia.  No hydronephrosis. Bladder is within normal limits. Stomach/Bowel: Stomach is within normal limits. No evidence of bowel obstruction. Normal appendix (series 3/ image 78). Vascular/Lymphatic: No evidence of abdominal aortic aneurysm. 16 mm short axis node along the porta hepatis (series 3/ image 36). Additional small upper abdominal lymph nodes. Reproductive: Uterus is within normal limits. Right ovary is mildly prominent. Left ovary is grossly unremarkable. Other: No abdominopelvic ascites. Musculoskeletal: Visualized osseous structures are within normal limits. IMPRESSION: Innumerable hepatic metastases, grossly unchanged. Associated prominent upper abdominal lymph nodes, including a 16 mm short axis node along the porta hepatis, suspicious for nodal metastasis. Mixed cystic/solid lesion in the posterior right lower lobe, incompletely visualized. The appearance on prior CT chest at least raises concern for possible primary bronchogenic neoplasm. Consider PET-CT and/or liver biopsy for further evaluation, as clinically warranted. Electronically Signed   By: Julian Hy  M.D.   On: 06/06/2017 20:54   US Abdomen Limited  Result Date: 06/11/2017 CLINICAL DATA:  41 year old with liver lesions and scheduled for liver biopsy. Liver biopsy was postponed due to incomplete lab tests prior to the procedure. EXAM: ULTRASOUND ABDOMEN LIMITED RIGHT UPPER QUADRANT COMPARISON:  CT 06/06/2017 and 05/10/2017.  Ultrasound 05/10/2017 FINDINGS: Images of the liver were obtained. Liver parenchyma is the diffusely heterogeneous, particularly the right hepatic lobe. Heterogeneity is due to innumerable hypoechoic liver lesions. The number of liver lesions has increased since the exam on 05/10/2017. IMPRESSION: Innumerable hypoechoic lesions throughout the liver, particularly in the right hepatic lobe. Findings are compatible with metastatic disease. Evidence for liver disease progression since 05/10/2017. Electronically Signed   By: Markus Daft M.D.   On: 06/11/2017 08:02   US Biopsy (liver)  Result Date: 06/13/2017 CLINICAL DATA:  Multiple liver lesions consistent metastatic disease. No known primary. EXAM: ULTRASOUND GUIDED CORE BIOPSY OF LIVER LESION MEDICATIONS: Intravenous Fentanyl and Versed were administered as conscious sedation during continuous monitoring of the patient's level of consciousness and physiological / cardiorespiratory status by the radiology RN, with a total moderate sedation time of 10 minutes. PROCEDURE: The procedure, risks,  benefits, and alternatives were explained to the patient. Questions regarding the procedure were encouraged and answered. The patient understands and consents to the procedure. Survey ultrasound of the liver was performed. Representative lesions were localized and an appropriate skin entry site was determined and marked. The operative field was prepped with chlorhexidine in a sterile fashion, and a sterile drape was applied covering the operative field. A sterile gown and sterile gloves were used for the procedure. Local anesthesia was provided with  1% Lidocaine. Under real-time ultrasound guidance, a 17 gauge trocar needle was advanced to the margin of the lesion. Once needle tip position was confirmed, coaxial 18-gauge core biopsy samples were obtained, submitted in formalin to surgical pathology. The guide needle was removed. The patient tolerated the procedure well. COMPLICATIONS: None. FINDINGS: Innumerable hypoechoic liver lesions were localized, corresponding to findings on previous CT. Representative core biopsies obtained as above IMPRESSION: 1. Technically successful ultrasound-guided liver lesion core biopsy Electronically Signed   By: Lucrezia Europe M.D.   On: 06/13/2017 09:14    Microbiology: No results found for this or any previous visit (from the past 240 hour(s)).   Labs: Basic Metabolic Panel: Recent Labs  Lab 06/08/17 0143 06/09/17 0300 06/10/17 0502 06/13/17 0536  NA 131* 131* 133* 136  K 4.5 4.3 4.8 4.8  CL 91* 97* 99* 100*  CO2 17* 20* 20* 23  GLUCOSE 130* 137* 123* 105*  BUN 27* 37* 36* 24*  CREATININE 1.23* 1.27* 1.12* 0.78  CALCIUM 9.4 9.2 9.6 9.4   Liver Function Tests: Recent Labs  Lab 06/08/17 0143 06/09/17 0300 06/10/17 0502 06/13/17 0536  AST 86* 103* 93* 89*  ALT 36 45 44 36  ALKPHOS 326* 278* 287* 233*  BILITOT 8.2* 7.0* 6.7* 6.0*  PROT 6.9 6.9 7.2 6.5  ALBUMIN 2.5* 2.6* 2.6* 2.4*   No results for input(s): LIPASE, AMYLASE in the last 168 hours. Recent Labs  Lab 06/08/17 0143  AMMONIA 52*   CBC: Recent Labs  Lab 06/08/17 0143 06/09/17 0300 06/10/17 0502 06/13/17 0536  WBC 10.6* 11.9* 11.6* 9.5  NEUTROABS 7.4  --  7.2 6.6  HGB 13.9 12.8 13.0 12.8  HCT 42.7 38.9 40.3 39.3  MCV 75.6* 75.1* 75.6* 77.1*  PLT PLATELET CLUMPS NOTED ON SMEAR, UNABLE TO ESTIMATE 284 PLATELET CLUMPS NOTED ON SMEAR, COUNT APPEARS ADEQUATE PLATELET CLUMPS NOTED ON SMEAR, COUNT APPEARS ADEQUATE   Cardiac Enzymes: Recent Labs  Lab 06/08/17 0801 06/08/17 1303 06/08/17 1920  TROPONINI 0.04* 0.03* 0.03*    BNP: BNP (last 3 results) No results for input(s): BNP in the last 8760 hours.  ProBNP (last 3 results) No results for input(s): PROBNP in the last 8760 hours.  CBG: Recent Labs  Lab 06/13/17 1117 06/13/17 1626 06/13/17 2006 06/14/17 0741 06/14/17 1146  GLUCAP 113* 124* 111* 134* 114*    Signed:  Barton Dubois MD.  [facility] 06/14/2017, 2:28 PM

## 2017-06-14 NOTE — Care Management Note (Signed)
Case Management Note  Patient Details  Name: Patricia Mcclain MRN: [id number] Date of Birth: 1976/04/26  Subjective/Objective:  Per PT-HHPT(has no insurance [facility] will provide charity care-HHRN-safety eval, & HH social worker , manual w/c, & 3n1 needed. Attending/[facility] rep Santiago Glad notified. Await for dme to be delivered to rm prior d/c.                Action/Plan:d/c home w/HHC/DME.   Expected Discharge Date:  (unknown)               Expected Discharge Plan:  [place]  In-House Referral:     Discharge planning Services  CM Consult  Post Acute Care Choice:    Choice offered to:  Patient  DME Arranged:  3-N-1, Wheelchair manual DME Agency:  [place]:  RN, Social Work [employer] Agency:  [place]  Status of Service:  Completed, signed off  If discussed at [employer], dates discussed:    Additional Comments:  Dessa Phi, RN 06/14/2017, 12:42 PM

## 2017-06-14 NOTE — Progress Notes (Signed)
Went over discharge paperwork with patient and family. All questions answered.  VSS.  Prescriptions and discharge papers given.  PT wheeled out via NT, with personal wheelchair and 3n1 BSC.

## 2017-06-14 NOTE — Progress Notes (Signed)
Patricia Mcclain   DOB:1975/11/27   MR#:[id number]    [facility] CONSULT NOTE  Patient Care Team: Levin Erp, MD as PCP - General (Internal Medicine)  CHIEF COMPLAINTS/PURPOSE OF CONSULTATION:  Diffuse liver metastasis  HISTORY OF PRESENTING ILLNESS:  Patricia Mcclain 41 y.o. female is seen because of diffuse liver metastasis. She has been complaining of severe pain on and off over the past month or so. The patient have history of abnormal Pap smear.  She also have positive family history of cancer. She has been complaining of progressive abdominal distention and right upper quadrant pain, rated at pain at 10 out of 10.  She was prescribed a lot of pain medicine with minimum pain control. CT imaging showed diffuse liver metastasis.  She underwent liver biopsy on June 13, 2017, pathology pending  She denies recent changes in bowel habits Denies recent cough, chest pain or shortness of breath No abnormal bone pain She complain of menorrhagia  MEDICAL HISTORY:  Past Medical History:  Diagnosis Date  . Cancer ([place])   . Congenital absence of one kidney   . Diabetes mellitus   . Hypertension   . Thyroid disease     SURGICAL HISTORY: Past Surgical History:  Procedure Laterality Date  . CESAREAN SECTION  2004    SOCIAL HISTORY: Social History   Socioeconomic History  . Marital status: Single    Spouse name: Not on file  . Number of children: Not on file  . Years of education: Not on file  . Highest education level: Not on file  Social Needs  . Financial resource strain: Not on file  . Food insecurity - worry: Not on file  . Food insecurity - inability: Not on file  . Transportation needs - medical: Not on file  . Transportation needs - non-medical: Not on file  Occupational History  . Not on file  Tobacco Use  . Smoking status: Former Smoker    Last attempt to quit: 06/06/2013    Years since quitting: 4.0  . Smokeless tobacco: Never Used  Substance  and Sexual Activity  . Alcohol use: Yes  . Drug use: No  . Sexual activity: Yes    Birth control/protection: None  Other Topics Concern  . Not on file  Social History Narrative  . Not on file    FAMILY HISTORY: Family History  Problem Relation Age of Onset  . Peripheral vascular disease Mother   . Diabetes Father     ALLERGIES:  is allergic to oxycodone.  MEDICATIONS:  Current Facility-Administered Medications  Medication Dose Route Frequency Provider Last Rate Last Dose  . atenolol (TENORMIN) tablet 50 mg  50 mg Oral Daily Jani Gravel, MD   50 mg at 06/14/17 0830  . clonazePAM (KLONOPIN) tablet 0.5 mg  0.5 mg Oral Q12H PRN Barton Dubois, MD      . docusate sodium (COLACE) capsule 100 mg  100 mg Oral BID Barton Dubois, MD   Stopped at 06/13/17 1041  . heparin injection 5,000 Units  5,000 Units Subcutaneous Q8H Green, Terri L, RPH   5,000 Units at 06/12/17 1300  . HYDROcodone-acetaminophen (NORCO/VICODIN) 5-325 MG per tablet 1-2 tablet  1-2 tablet Oral Q4H PRN Arne Cleveland, MD   2 tablet at 06/14/17 1230  . insulin aspart (novoLOG) injection 0-5 Units  0-5 Units Subcutaneous QHS Jani Gravel, MD      . insulin aspart (novoLOG) injection 0-9 Units  0-9 Units Subcutaneous TID WC Jani Gravel, MD  1 Units at 06/14/17 0830  . levothyroxine (SYNTHROID, LEVOTHROID) tablet 200 mcg  200 mcg Oral QAC breakfast Jani Gravel, MD   200 mcg at 06/14/17 0829  . metoCLOPramide (REGLAN) tablet 10 mg  10 mg Oral Q6H PRN Jani Gravel, MD   10 mg at 06/09/17 2043  . ondansetron (ZOFRAN) tablet 4 mg  4 mg Oral Q6H PRN Jani Gravel, MD   4 mg at 06/13/17 1513  . oxyCODONE (Oxy IR/ROXICODONE) immediate release tablet 7.5 mg  7.5 mg Oral Q6H PRN Barton Dubois, MD   7.5 mg at 06/12/17 1725  . polyethylene glycol (MIRALAX / GLYCOLAX) packet 17 g  17 g Oral Daily Barton Dubois, MD   Stopped at 06/13/17 1039  . sertraline (ZOLOFT) tablet 200 mg  200 mg Oral Daily Jani Gravel, MD   200 mg at 06/14/17 0830    Current Outpatient Medications  Medication Sig Dispense Refill  . acetaminophen (TYLENOL) 325 MG tablet Take 2 tablets (650 mg total) by mouth every 6 (six) hours as needed (for pain, fever above 101 and Headache.). 50 tablet 0  . atenolol (TENORMIN) 50 MG tablet Take 50 mg by mouth daily.    . clonazePAM (KLONOPIN) 0.5 MG tablet Take 0.5 mg by mouth 3 (three) times daily as needed for anxiety.     . insulin detemir (LEVEMIR) 100 unit/ml SOLN Inject 0.3 mLs (30 Units total) into the skin at bedtime. (Patient taking differently: Inject 30 Units into the skin at bedtime as needed (low sugar). ) 10 mL 1  . levothyroxine (SYNTHROID, LEVOTHROID) 200 MCG tablet Take 200 mcg by mouth daily before breakfast. Take with 50mg  tablet for total dose of = 250mg     . ondansetron (ZOFRAN) 4 MG tablet Take 1 tablet (4 mg total) by mouth every 6 (six) hours as needed for nausea or vomiting. 12 tablet 0  . sertraline (ZOLOFT) 100 MG tablet Take 200 mg by mouth daily.     . sitaGLIPtin (JANUVIA) 100 MG tablet Take 100 mg by mouth daily.    Marland Kitchen docusate sodium (COLACE) 100 MG capsule Take 1 capsule (100 mg total) 2 (two) times daily by mouth. 60 capsule 0  . HYDROcodone-acetaminophen (NORCO/VICODIN) 5-325 MG tablet Take 1-2 tablets every 6 (six) hours as needed by mouth for severe pain. 40 tablet 0  . Insulin Pen Needle 31G X 5 MM MISC Use as needed to inject insulin daily as instructed 100 each 0  . losartan (COZAAR) 25 MG tablet Take 1 tablet (25 mg total) daily by mouth. 30 tablet 1  . metoCLOPramide (REGLAN) 10 MG tablet Take 1 tablet (10 mg total) by mouth every 6 (six) hours as needed for nausea. 28 tablet 0    REVIEW OF SYSTEMS:   Constitutional: Denies fevers, chills or abnormal night sweats Eyes: Denies blurriness of vision, double vision or watery eyes Ears, nose, mouth, throat, and face: Denies mucositis or sore throat Respiratory: Denies cough, dyspnea or wheezes Cardiovascular: Denies palpitation,  chest discomfort or lower extremity swelling Gastrointestinal:  Denies nausea, heartburn or change in bowel habits Skin: Denies abnormal skin rashes Lymphatics: Denies new lymphadenopathy or easy bruising Neurological:Denies numbness, tingling or new weaknesses Behavioral/Psych: Mood is stable, no new changes  All other systems were reviewed with the patient and are negative.  PHYSICAL EXAMINATION: ECOG PERFORMANCE STATUS: 1 - Symptomatic but completely ambulatory  Vitals:   06/13/17 2027 06/14/17 0608  BP: 140/80 122/64  Pulse: 94 93  Resp: 15 20  Temp:  97.9 F (36.6 C) (!) 97.5 F (36.4 C)  SpO2: 94% 95%   Filed Weights   06/12/17 0537 06/13/17 0443 06/14/17 0500  Weight: 257 lb 0.9 oz (116.6 kg) 252 lb 3.3 oz (114.4 kg) 250 lb 7.1 oz (113.6 kg)    GENERAL:alert, no distress and comfortable SKIN: skin color, texture, turgor are normal, no rashes or significant lesions EYES: normal, conjunctiva are pink and non-injected, sclera clear OROPHARYNX:no exudate, no erythema and lips, buccal mucosa, and tongue normal  NECK: supple, thyroid normal size, non-tender, without nodularity LYMPH:  no palpable lymphadenopathy in the cervical, axillary or inguinal LUNGS: clear to auscultation and percussion with normal breathing effort HEART: regular rate & rhythm and no murmurs and no lower extremity edema ABDOMEN:abdomen soft, distended with palpable right upper quadrant mass, tender to palpation Musculoskeletal:no cyanosis of digits and no clubbing  PSYCH: alert & oriented x 3 with fluent speech NEURO: no focal motor/sensory deficits  LABORATORY DATA:  I have reviewed the data as listed Lab Results  Component Value Date   WBC 9.5 06/13/2017   HGB 12.8 06/13/2017   HCT 39.3 06/13/2017   MCV 77.1 (L) 06/13/2017   PLT  06/13/2017    PLATELET CLUMPS NOTED ON SMEAR, COUNT APPEARS ADEQUATE   Recent Labs    06/06/17 2039  06/09/17 0300 06/10/17 0502 06/13/17 0536  NA 131*   < >  131* 133* 136  K 4.0   < > 4.3 4.8 4.8  CL 93*   < > 97* 99* 100*  CO2 21*   < > 20* 20* 23  GLUCOSE 115*   < > 137* 123* 105*  BUN 13   < > 37* 36* 24*  CREATININE 1.08*   < > 1.27* 1.12* 0.78  CALCIUM 9.1   < > 9.2 9.6 9.4  GFRNONAA >60   < > 51* 60* >60  GFRAA >60   < > 59* >60 >60  PROT 6.1*   < > 6.9 7.2 6.5  ALBUMIN 2.2*   < > 2.6* 2.6* 2.4*  AST 102*   < > 103* 93* 89*  ALT 40   < > 45 44 36  ALKPHOS 319*   < > 278* 287* 233*  BILITOT 8.2*   < > 7.0* 6.7* 6.0*  BILIDIR 4.8*  --   --  3.7*  --   IBILI  --   --   --  3.0*  --    < > = values in this interval not displayed.    RADIOGRAPHIC STUDIES: I have personally reviewed the radiological images as listed and agreed with the findings in the report. Ct Angio Chest Pe W And/or Wo Contrast  Addendum Date: 06/09/2017   ADDENDUM REPORT: 06/09/2017 11:40 ADDENDUM: It must be noted that there is a degree of motion artifact associated with this study. It is quite possible that the area of concern for small incompletely obstructing pulmonary embolus in the left lower lobe represents motion artifact as opposed to actual pulmonary embolus. This finding is not seen convincingly on reformatted images and may indeed be artifactual. It is not felt that this equivocal focus of decreased attenuation is clinically significant, particularly in light of patient's overall clinical history. These results will be called to the ordering clinician or representative by the Radiologist Assistant, and communication documented in the PACS or zVision Dashboard. Electronically Signed   By: Lowella Grip III M.D.   On: 06/09/2017 11:40   Result Date: 06/09/2017 CLINICAL DATA:  Chest pain and elevated D-dimer. History of liver carcinoma EXAM: CT ANGIOGRAPHY CHEST WITH CONTRAST TECHNIQUE: Multidetector CT imaging of the chest was performed using the standard protocol during bolus administration of intravenous contrast. Multiplanar CT image reconstructions and  MIPs were obtained to evaluate the vascular anatomy. CONTRAST:  100 mL Isovue 370 nonionic COMPARISON:  CT chest and abdomen May 10, 2017; chest radiograph May 13, 2017 FINDINGS: Cardiovascular: There are small incompletely obstructing pulmonary emboli in the left lower lobe pulmonary artery. No more central pulmonary emboli are evident. The right ventricle to left ventricle diameter ratio is less than 0.9, not indicative of right heart strain. There is no thoracic aortic aneurysm or dissection. Visualized great vessels appear unremarkable. Right and left common carotid arteries arise as a common trunk, an anatomic variant. Pericardium is not appreciably thickened. There is no pericardial effusion. Mediastinum/Nodes: Visualized thyroid is diminutive without focal lesion evident. There is no appreciable thoracic aortic adenopathy. No esophageal lesions are appreciable. Lungs/Pleura: There is a nodular opacity in the superior segment of the right lower lobe measuring 6 x 6 mm, appreciable on axial slice 53 series 7. There is a nearby nodular opacity on this same slice in the superior segment of the right lower lobe measuring 6 x 6 mm. There is an ill-defined opacity abutting the pleura in the superior segment of the left lower lobe seen on axial slice 45 series 7 measuring 6 x 6 mm. There is no edema or consolidation. There is slight bibasilar atelectasis. No pleural effusion or pleural thickening evident. Upper Abdomen: The liver is enlarged with widespread mass lesions, consistent with widespread metastatic disease to the liver. This appearance is better seen on venous phase imaging from most recent CT compared to arterial phase imaging on this current study. Visualized upper abdominal structures appear unremarkable. Musculoskeletal: There is upper thoracic levoscoliosis. There are no lytic or destructive bone lesions. There is subtle sclerosis in the T1 and T2 vertebral bodies, potentially representing  sclerotic metastatic lesions. Review of the MIP images confirms the above findings. IMPRESSION: 1. Small pulmonary emboli, incompletely obstructing, in the left lower lobe pulmonary artery. No more central pulmonary emboli identified. No pulmonary emboli elsewhere seen. No right heart strain evident. 2. Nodular opacities in both lower lobes measuring 6 mm. Given widespread liver metastases, small metastatic pulmonary nodular lesions must be of concern. No consolidation. No larger parenchymal lung mass lesions. 3.  No evident thoracic adenopathy. 4. Enlarged liver with widespread metastatic disease, better evident on venous phase imaging from recent prior CT as opposed to this arterial phase study. 5. Rather subtle sclerosis in the T1 and T2 vertebral bodies. Concern for early sclerotic metastatic disease in these areas. No lytic or destructive bony lesions. Critical Value/emergent results were called by telephone at the time of interpretation on 06/08/2017 at 11:54 am to Dr. Davonna Belling , who verbally acknowledged these results. Electronically Signed: By: Lowella Grip III M.D. On: 06/08/2017 11:55   Ct Abdomen Pelvis W Contrast  Result Date: 06/06/2017 CLINICAL DATA:  Abdominal distention, nausea, diarrhea, weakness, jaundice EXAM: CT ABDOMEN AND PELVIS WITH CONTRAST TECHNIQUE: Multidetector CT imaging of the abdomen and pelvis was performed using the standard protocol following bolus administration of intravenous contrast. CONTRAST:  143mL ISOVUE-300 IOPAMIDOL (ISOVUE-300) INJECTION 61% COMPARISON:  05/10/2017 FINDINGS: Lower chest: Mixed cystic/ solid opacity in the posterior right lower lobe (series 5/ image 1), incompletely visualized. Hepatobiliary: Innumerable hepatic metastases throughout the liver. Gallbladder is unremarkable. No intrahepatic or extrahepatic ductal  dilatation. Pancreas: Within normal limits. Spleen: Within normal limits. Adrenals/Urinary Tract: Adrenal glands are within normal  limits. Cross fused renal ectopia.  No hydronephrosis. Bladder is within normal limits. Stomach/Bowel: Stomach is within normal limits. No evidence of bowel obstruction. Normal appendix (series 3/ image 78). Vascular/Lymphatic: No evidence of abdominal aortic aneurysm. 16 mm short axis node along the porta hepatis (series 3/ image 36). Additional small upper abdominal lymph nodes. Reproductive: Uterus is within normal limits. Right ovary is mildly prominent. Left ovary is grossly unremarkable. Other: No abdominopelvic ascites. Musculoskeletal: Visualized osseous structures are within normal limits. IMPRESSION: Innumerable hepatic metastases, grossly unchanged. Associated prominent upper abdominal lymph nodes, including a 16 mm short axis node along the porta hepatis, suspicious for nodal metastasis. Mixed cystic/solid lesion in the posterior right lower lobe, incompletely visualized. The appearance on prior CT chest at least raises concern for possible primary bronchogenic neoplasm. Consider PET-CT and/or liver biopsy for further evaluation, as clinically warranted. Electronically Signed   By: Julian Hy M.D.   On: 06/06/2017 20:54   US Abdomen Limited  Result Date: 06/11/2017 CLINICAL DATA:  41 year old with liver lesions and scheduled for liver biopsy. Liver biopsy was postponed due to incomplete lab tests prior to the procedure. EXAM: ULTRASOUND ABDOMEN LIMITED RIGHT UPPER QUADRANT COMPARISON:  CT 06/06/2017 and 05/10/2017.  Ultrasound 05/10/2017 FINDINGS: Images of the liver were obtained. Liver parenchyma is the diffusely heterogeneous, particularly the right hepatic lobe. Heterogeneity is due to innumerable hypoechoic liver lesions. The number of liver lesions has increased since the exam on 05/10/2017. IMPRESSION: Innumerable hypoechoic lesions throughout the liver, particularly in the right hepatic lobe. Findings are compatible with metastatic disease. Evidence for liver disease progression since  05/10/2017. Electronically Signed   By: Markus Daft M.D.   On: 06/11/2017 08:02   US Biopsy (liver)  Result Date: 06/13/2017 CLINICAL DATA:  Multiple liver lesions consistent metastatic disease. No known primary. EXAM: ULTRASOUND GUIDED CORE BIOPSY OF LIVER LESION MEDICATIONS: Intravenous Fentanyl and Versed were administered as conscious sedation during continuous monitoring of the patient's level of consciousness and physiological / cardiorespiratory status by the radiology RN, with a total moderate sedation time of 10 minutes. PROCEDURE: The procedure, risks, benefits, and alternatives were explained to the patient. Questions regarding the procedure were encouraged and answered. The patient understands and consents to the procedure. Survey ultrasound of the liver was performed. Representative lesions were localized and an appropriate skin entry site was determined and marked. The operative field was prepped with chlorhexidine in a sterile fashion, and a sterile drape was applied covering the operative field. A sterile gown and sterile gloves were used for the procedure. Local anesthesia was provided with 1% Lidocaine. Under real-time ultrasound guidance, a 17 gauge trocar needle was advanced to the margin of the lesion. Once needle tip position was confirmed, coaxial 18-gauge core biopsy samples were obtained, submitted in formalin to surgical pathology. The guide needle was removed. The patient tolerated the procedure well. COMPLICATIONS: None. FINDINGS: Innumerable hypoechoic liver lesions were localized, corresponding to findings on previous CT. Representative core biopsies obtained as above IMPRESSION: 1. Technically successful ultrasound-guided liver lesion core biopsy Electronically Signed   By: Lucrezia Europe M.D.   On: 06/13/2017 09:14    ASSESSMENT & PLAN:  Diffuse liver metastasis Source unknown, biopsy pending I have tentatively schedule outpatient appointment to see her back in 2 days to review  test results  Severe cancer pain Recommend continue pain medication  Chronic constipation Recommend aggressive laxative therapy  All questions were answered. The patient knows to call the clinic with any problems, questions or concerns.    Heath Lark, MD 06/14/2017 4:50 PM

## 2017-06-14 NOTE — Care Management Note (Signed)
Case Management Note  Patient Details  Name: Patricia Mcclain MRN: [id number] Date of Birth: [birth year]  Subjective/Objective: Patient qualifies for charity care from [place] following. Recc HHRN,CSW. PT cons-await recc. May need rw.Provided patient w/Health insurance info. Development worker, community initiated [employer].                   Action/Plan:d/c plan home w/HHC.   Expected Discharge Date:  (unknown)               Expected Discharge Plan:  [place]  In-House Referral:     Discharge planning Services  CM Consult  Post Acute Care Choice:    Choice offered to:  Patient  DME Arranged:    DME Agency:     HH Arranged:  RN, Social Work [employer] Agency:  [place]  Status of Service:  In process, will continue to follow  If discussed at Long Length of Stay Meetings, dates discussed:    Additional Comments:  Dessa Phi, RN 06/14/2017, 11:22 AM

## 2017-06-16 ENCOUNTER — Other Ambulatory Visit: Payer: Self-pay

## 2017-06-16 ENCOUNTER — Inpatient Hospital Stay: Payer: Self-pay | Admitting: Hematology and Oncology

## 2017-06-16 ENCOUNTER — Telehealth: Payer: Self-pay | Admitting: Hematology and Oncology

## 2017-06-16 MED ORDER — METOCLOPRAMIDE HCL 10 MG PO TABS: 10.0000 mg | ORAL_TABLET | Freq: Four times a day (QID) | ORAL | 0 refills | Status: AC | PRN

## 2017-06-16 NOTE — Telephone Encounter (Signed)
Rescheduled per Dr. Due to results not coming back

## 2017-06-20 ENCOUNTER — Encounter: Payer: Self-pay | Admitting: Hematology and Oncology

## 2017-06-20 ENCOUNTER — Other Ambulatory Visit: Payer: Self-pay | Admitting: Hematology and Oncology

## 2017-06-20 ENCOUNTER — Inpatient Hospital Stay (HOSPITAL_COMMUNITY)
Admission: AD | Admit: 2017-06-20 | Discharge: 2017-07-09 | DRG: 840 | Disposition: E | Payer: Medicaid Other | Source: Ambulatory Visit | Attending: Pulmonary Disease | Admitting: Pulmonary Disease

## 2017-06-20 ENCOUNTER — Encounter (HOSPITAL_COMMUNITY): Payer: Self-pay

## 2017-06-20 ENCOUNTER — Other Ambulatory Visit: Payer: Self-pay

## 2017-06-20 ENCOUNTER — Inpatient Hospital Stay (HOSPITAL_COMMUNITY): Payer: Medicaid Other

## 2017-06-20 DIAGNOSIS — D684 Acquired coagulation factor deficiency: Secondary | ICD-10-CM | POA: Diagnosis present

## 2017-06-20 DIAGNOSIS — E440 Moderate protein-calorie malnutrition: Secondary | ICD-10-CM | POA: Diagnosis present

## 2017-06-20 DIAGNOSIS — E872 Acidosis: Secondary | ICD-10-CM | POA: Diagnosis not present

## 2017-06-20 DIAGNOSIS — Z833 Family history of diabetes mellitus: Secondary | ICD-10-CM

## 2017-06-20 DIAGNOSIS — E11649 Type 2 diabetes mellitus with hypoglycemia without coma: Secondary | ICD-10-CM | POA: Diagnosis present

## 2017-06-20 DIAGNOSIS — J9601 Acute respiratory failure with hypoxia: Secondary | ICD-10-CM | POA: Diagnosis not present

## 2017-06-20 DIAGNOSIS — Z794 Long term (current) use of insulin: Secondary | ICD-10-CM | POA: Diagnosis not present

## 2017-06-20 DIAGNOSIS — E119 Type 2 diabetes mellitus without complications: Secondary | ICD-10-CM

## 2017-06-20 DIAGNOSIS — E874 Mixed disorder of acid-base balance: Secondary | ICD-10-CM | POA: Diagnosis not present

## 2017-06-20 DIAGNOSIS — Z8249 Family history of ischemic heart disease and other diseases of the circulatory system: Secondary | ICD-10-CM

## 2017-06-20 DIAGNOSIS — E039 Hypothyroidism, unspecified: Secondary | ICD-10-CM | POA: Diagnosis present

## 2017-06-20 DIAGNOSIS — G9340 Encephalopathy, unspecified: Secondary | ICD-10-CM | POA: Diagnosis not present

## 2017-06-20 DIAGNOSIS — R630 Anorexia: Secondary | ICD-10-CM

## 2017-06-20 DIAGNOSIS — C8330 Diffuse large B-cell lymphoma, unspecified site: Principal | ICD-10-CM | POA: Diagnosis present

## 2017-06-20 DIAGNOSIS — K7200 Acute and subacute hepatic failure without coma: Secondary | ICD-10-CM

## 2017-06-20 DIAGNOSIS — G92 Toxic encephalopathy: Secondary | ICD-10-CM | POA: Diagnosis not present

## 2017-06-20 DIAGNOSIS — Z87891 Personal history of nicotine dependence: Secondary | ICD-10-CM

## 2017-06-20 DIAGNOSIS — A419 Sepsis, unspecified organism: Secondary | ICD-10-CM | POA: Diagnosis not present

## 2017-06-20 DIAGNOSIS — R945 Abnormal results of liver function studies: Secondary | ICD-10-CM

## 2017-06-20 DIAGNOSIS — T40605A Adverse effect of unspecified narcotics, initial encounter: Secondary | ICD-10-CM | POA: Diagnosis not present

## 2017-06-20 DIAGNOSIS — Q600 Renal agenesis, unilateral: Secondary | ICD-10-CM

## 2017-06-20 DIAGNOSIS — I10 Essential (primary) hypertension: Secondary | ICD-10-CM | POA: Diagnosis present

## 2017-06-20 DIAGNOSIS — Z4659 Encounter for fitting and adjustment of other gastrointestinal appliance and device: Secondary | ICD-10-CM

## 2017-06-20 DIAGNOSIS — N182 Chronic kidney disease, stage 2 (mild): Secondary | ICD-10-CM

## 2017-06-20 DIAGNOSIS — J9811 Atelectasis: Secondary | ICD-10-CM | POA: Diagnosis not present

## 2017-06-20 DIAGNOSIS — G893 Neoplasm related pain (acute) (chronic): Secondary | ICD-10-CM | POA: Diagnosis present

## 2017-06-20 DIAGNOSIS — E883 Tumor lysis syndrome: Secondary | ICD-10-CM | POA: Diagnosis not present

## 2017-06-20 DIAGNOSIS — R109 Unspecified abdominal pain: Secondary | ICD-10-CM | POA: Diagnosis present

## 2017-06-20 DIAGNOSIS — C8339 Diffuse large B-cell lymphoma, extranodal and solid organ sites: Secondary | ICD-10-CM

## 2017-06-20 DIAGNOSIS — R748 Abnormal levels of other serum enzymes: Secondary | ICD-10-CM

## 2017-06-20 DIAGNOSIS — Z6841 Body Mass Index (BMI) 40.0 and over, adult: Secondary | ICD-10-CM | POA: Diagnosis not present

## 2017-06-20 DIAGNOSIS — K7201 Acute and subacute hepatic failure with coma: Secondary | ICD-10-CM | POA: Diagnosis not present

## 2017-06-20 DIAGNOSIS — J9600 Acute respiratory failure, unspecified whether with hypoxia or hypercapnia: Secondary | ICD-10-CM

## 2017-06-20 DIAGNOSIS — Z9289 Personal history of other medical treatment: Secondary | ICD-10-CM

## 2017-06-20 DIAGNOSIS — E1122 Type 2 diabetes mellitus with diabetic chronic kidney disease: Secondary | ICD-10-CM

## 2017-06-20 DIAGNOSIS — E790 Hyperuricemia without signs of inflammatory arthritis and tophaceous disease: Secondary | ICD-10-CM

## 2017-06-20 DIAGNOSIS — R579 Shock, unspecified: Secondary | ICD-10-CM

## 2017-06-20 DIAGNOSIS — G4700 Insomnia, unspecified: Secondary | ICD-10-CM

## 2017-06-20 DIAGNOSIS — N179 Acute kidney failure, unspecified: Secondary | ICD-10-CM

## 2017-06-20 DIAGNOSIS — C83398 Diffuse large b-cell lymphoma of other extranodal and solid organ sites: Secondary | ICD-10-CM

## 2017-06-20 DIAGNOSIS — E1129 Type 2 diabetes mellitus with other diabetic kidney complication: Secondary | ICD-10-CM | POA: Diagnosis present

## 2017-06-20 DIAGNOSIS — R6521 Severe sepsis with septic shock: Secondary | ICD-10-CM | POA: Diagnosis not present

## 2017-06-20 DIAGNOSIS — D696 Thrombocytopenia, unspecified: Secondary | ICD-10-CM | POA: Diagnosis not present

## 2017-06-20 DIAGNOSIS — R7989 Other specified abnormal findings of blood chemistry: Secondary | ICD-10-CM

## 2017-06-20 DIAGNOSIS — Z66 Do not resuscitate: Secondary | ICD-10-CM | POA: Diagnosis not present

## 2017-06-20 DIAGNOSIS — J810 Acute pulmonary edema: Secondary | ICD-10-CM

## 2017-06-20 DIAGNOSIS — Z5111 Encounter for antineoplastic chemotherapy: Secondary | ICD-10-CM

## 2017-06-20 DIAGNOSIS — E875 Hyperkalemia: Secondary | ICD-10-CM | POA: Diagnosis present

## 2017-06-20 LAB — COMPREHENSIVE METABOLIC PANEL
ALBUMIN: 2.5 g/dL — AB (ref 3.5–5.0)
ALK PHOS: 311 U/L — AB (ref 38–126)
ALT: 55 U/L — ABNORMAL HIGH (ref 14–54)
ANION GAP: 12 (ref 5–15)
AST: 123 U/L — AB (ref 15–41)
BUN: 15 mg/dL (ref 6–20)
CALCIUM: 9.4 mg/dL (ref 8.9–10.3)
CO2: 25 mmol/L (ref 22–32)
Chloride: 96 mmol/L — ABNORMAL LOW (ref 101–111)
Creatinine, Ser: 0.76 mg/dL (ref 0.44–1.00)
GFR calc Af Amer: >60 mL/min (ref >60)
GFR calc non Af Amer: >60 mL/min (ref >60)
GLUCOSE: 50 mg/dL — AB (ref 65–99)
POTASSIUM: 3.4 mmol/L — AB (ref 3.5–5.1)
SODIUM: 133 mmol/L — AB (ref 135–145)
Total Bilirubin: 8.3 mg/dL — ABNORMAL HIGH (ref 0.3–1.2)
Total Protein: 6.5 g/dL (ref 6.5–8.1)

## 2017-06-20 LAB — CBC WITH DIFFERENTIAL/PLATELET
Basophils Absolute: 0.1 10*3/uL (ref 0.0–0.1)
Basophils Relative: 1 %
EOS ABS: 0.2 10*3/uL (ref 0.0–0.7)
Eosinophils Relative: 2 %
HCT: 42.5 % (ref 36.0–46.0)
HEMOGLOBIN: 14.1 g/dL (ref 12.0–15.0)
LYMPHS ABS: 1.2 10*3/uL (ref 0.7–4.0)
LYMPHS PCT: 10 %
MCH: 25.9 pg — AB (ref 26.0–34.0)
MCHC: 33.2 g/dL (ref 30.0–36.0)
MCV: 78.1 fL (ref 78.0–100.0)
MONO ABS: 0.9 10*3/uL (ref 0.1–1.0)
Monocytes Relative: 8 %
NEUTROS ABS: 9.2 10*3/uL — AB (ref 1.7–7.7)
Neutrophils Relative %: 79 %
Platelets: ADEQUATE 10*3/uL (ref 150–400)
RBC: 5.44 MIL/uL — ABNORMAL HIGH (ref 3.87–5.11)
RDW: 28.4 % — AB (ref 11.5–15.5)
WBC: 11.6 10*3/uL — ABNORMAL HIGH (ref 4.0–10.5)

## 2017-06-20 LAB — GLUCOSE, CAPILLARY
GLUCOSE-CAPILLARY: 44 mg/dL — AB (ref 65–99)
Glucose-Capillary: 85 mg/dL (ref 65–99)
Glucose-Capillary: 84 mg/dL (ref 65–99)

## 2017-06-20 LAB — LACTATE DEHYDROGENASE: LDH: 2111 U/L — ABNORMAL HIGH (ref 98–192)

## 2017-06-20 LAB — URIC ACID: URIC ACID, SERUM: 9.4 mg/dL — AB (ref 2.3–6.6)

## 2017-06-20 MED ORDER — CEFAZOLIN SODIUM-DEXTROSE 2-4 GM/100ML-% IV SOLN
2.0000 g | INTRAVENOUS | Status: AC
  Administered 2017-06-21: 2 g via INTRAVENOUS

## 2017-06-20 MED ORDER — HYDROCORTISONE 2.5 % RE CREA
1.0000 "application " | TOPICAL_CREAM | Freq: Two times a day (BID) | RECTAL | Status: DC | PRN
  Filled 2017-06-20: qty 28.35

## 2017-06-20 MED ORDER — CLONAZEPAM 0.5 MG PO TABS: 0.5000 mg | ORAL_TABLET | Freq: Three times a day (TID) | ORAL | Status: DC | PRN

## 2017-06-20 MED ORDER — DEXAMETHASONE SODIUM PHOSPHATE 4 MG/ML IJ SOLN
8.0000 mg | INTRAMUSCULAR | Status: AC
  Administered 2017-06-20 – 2017-06-21 (×2): 8 mg via INTRAVENOUS
  Filled 2017-06-20 (×2): qty 2

## 2017-06-20 MED ORDER — LIP MEDEX EX OINT
TOPICAL_OINTMENT | CUTANEOUS | Status: AC
  Administered 2017-06-20: 17:00:00
  Filled 2017-06-20: qty 7

## 2017-06-20 MED ORDER — INSULIN ASPART 100 UNIT/ML ~~LOC~~ SOLN
0.0000 [IU] | Freq: Three times a day (TID) | SUBCUTANEOUS | Status: DC
  Administered 2017-06-21: 2 [IU] via SUBCUTANEOUS
  Administered 2017-06-22: 4 [IU] via SUBCUTANEOUS
  Administered 2017-06-22: 2 [IU] via SUBCUTANEOUS
  Administered 2017-06-23 (×2): 4 [IU] via SUBCUTANEOUS
  Administered 2017-06-23: 12 [IU] via SUBCUTANEOUS

## 2017-06-20 MED ORDER — SENNOSIDES-DOCUSATE SODIUM 8.6-50 MG PO TABS: 1.0000 | ORAL_TABLET | Freq: Every evening | ORAL | Status: DC | PRN

## 2017-06-20 MED ORDER — ONDANSETRON HCL 4 MG PO TABS
4.0000 mg | ORAL_TABLET | Freq: Three times a day (TID) | ORAL | Status: DC | PRN
  Filled 2017-06-20: qty 1

## 2017-06-20 MED ORDER — SODIUM CHLORIDE 0.9 % IV SOLN
8.0000 mg | Freq: Three times a day (TID) | INTRAVENOUS | Status: DC | PRN
  Filled 2017-06-20: qty 4

## 2017-06-20 MED ORDER — ACETAMINOPHEN 325 MG PO TABS: 650.0000 mg | ORAL_TABLET | ORAL | Status: DC | PRN

## 2017-06-20 MED ORDER — ATENOLOL 50 MG PO TABS
50.0000 mg | ORAL_TABLET | Freq: Every day | ORAL | Status: DC
  Administered 2017-06-20 – 2017-06-21 (×2): 50 mg via ORAL
  Filled 2017-06-20 (×2): qty 1

## 2017-06-20 MED ORDER — ACETAMINOPHEN 325 MG PO TABS: 650.0000 mg | ORAL_TABLET | Freq: Four times a day (QID) | ORAL | Status: DC | PRN

## 2017-06-20 MED ORDER — SERTRALINE HCL 100 MG PO TABS
200.0000 mg | ORAL_TABLET | Freq: Every day | ORAL | Status: DC
  Administered 2017-06-20 – 2017-06-26 (×7): 200 mg via ORAL
  Filled 2017-06-20 (×8): qty 2

## 2017-06-20 MED ORDER — ONDANSETRON HCL 4 MG/2ML IJ SOLN
4.0000 mg | Freq: Three times a day (TID) | INTRAMUSCULAR | Status: DC | PRN
  Administered 2017-06-20 – 2017-06-23 (×2): 4 mg via INTRAVENOUS
  Filled 2017-06-20 (×2): qty 2

## 2017-06-20 MED ORDER — ACYCLOVIR 400 MG PO TABS
400.0000 mg | ORAL_TABLET | Freq: Every day | ORAL | Status: DC
  Administered 2017-06-20 – 2017-06-26 (×7): 400 mg via ORAL
  Filled 2017-06-20 (×7): qty 1

## 2017-06-20 MED ORDER — RASBURICASE 1.5 MG IV SOLR
3.0000 mg | Freq: Once | INTRAVENOUS | Status: AC
  Administered 2017-06-20: 3 mg via INTRAVENOUS
  Filled 2017-06-20: qty 2

## 2017-06-20 MED ORDER — HYDROMORPHONE HCL 1 MG/ML IJ SOLN
1.0000 mg | INTRAMUSCULAR | Status: DC | PRN
  Administered 2017-06-20 – 2017-06-22 (×6): 1 mg via INTRAVENOUS
  Filled 2017-06-20 (×7): qty 1

## 2017-06-20 MED ORDER — DOCUSATE SODIUM 100 MG PO CAPS
100.0000 mg | ORAL_CAPSULE | Freq: Two times a day (BID) | ORAL | Status: DC
  Administered 2017-06-20 – 2017-06-22 (×5): 100 mg via ORAL
  Filled 2017-06-20 (×5): qty 1

## 2017-06-20 MED ORDER — INSULIN DETEMIR 100 UNIT/ML ~~LOC~~ SOLN
15.0000 [IU] | Freq: Every day | SUBCUTANEOUS | Status: DC
  Administered 2017-06-20: 15 [IU] via SUBCUTANEOUS
  Filled 2017-06-20: qty 0.15

## 2017-06-20 MED ORDER — DIPHENHYDRAMINE HCL 25 MG PO CAPS
25.0000 mg | ORAL_CAPSULE | Freq: Four times a day (QID) | ORAL | Status: DC | PRN
  Administered 2017-06-20 – 2017-06-21 (×2): 25 mg via ORAL
  Filled 2017-06-20 (×3): qty 1

## 2017-06-20 MED ORDER — ALUM & MAG HYDROXIDE-SIMETH 200-200-20 MG/5ML PO SUSP: 60.0000 mL | ORAL | Status: DC | PRN

## 2017-06-20 MED ORDER — ONDANSETRON 4 MG PO TBDP
4.0000 mg | ORAL_TABLET | Freq: Three times a day (TID) | ORAL | Status: DC | PRN
  Filled 2017-06-20: qty 2

## 2017-06-20 MED ORDER — GUAIFENESIN-DM 100-10 MG/5ML PO SYRP: 10.0000 mL | ORAL_SOLUTION | ORAL | Status: DC | PRN

## 2017-06-20 MED ORDER — SODIUM CHLORIDE 0.9 % IV SOLN
INTRAVENOUS | Status: DC
  Administered 2017-06-20: 1000 mL via INTRAVENOUS
  Administered 2017-06-21 (×2): via INTRAVENOUS

## 2017-06-20 MED ORDER — LEVOTHYROXINE SODIUM 100 MCG PO TABS
200.0000 ug | ORAL_TABLET | Freq: Every day | ORAL | Status: DC
  Administered 2017-06-21 – 2017-06-26 (×6): 200 ug via ORAL
  Filled 2017-06-20 (×6): qty 2

## 2017-06-20 NOTE — H&P (Signed)
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 female is admitted for high dose, inpatient chemotherapy Summary of oncologic history as follows:   Diffuse large B cell lymphoma ([place])   05/10/2017 Imaging    RUQ [place] No acute gallbladder disease.  Numerous hypodense liver masses new since the previous exams concerning for metastatic disease      05/11/2017 Imaging    Ct scan of chest, abdomen and pelvis Mild focal airspace opacification over the periphery of the right lower lobe which may be due to atelectasis or infection and less likely neoplasm. Recommend follow-up chest CT 3-4 weeks. Two subcentimeter left lung nodules stable since 2015.  Numerous hypodense masses throughout the liver new compared to the previous exam is concerning for metastatic disease. Consider PET-CT versus tissue sampling. Mild increased number of small lymph nodes over the upper abdomen as described.   Absent left kidney. Minimal prominence of the right intrarenal collecting system and ureter without stones or mass identified.  4.1 cm right ovarian cyst.       06/06/2017 Imaging    Ct scan of abdomen and pelvis Innumerable hepatic metastases, grossly unchanged.  Associated prominent upper abdominal lymph nodes, including a 16 mm short axis node along the porta hepatis, suspicious for nodal metastasis.  Mixed cystic/solid lesion in the posterior right lower lobe, incompletely visualized. The appearance on prior CT chest at least raises concern for possible primary bronchogenic neoplasm.  Consider PET-CT and/or liver biopsy for further evaluation, as clinically warranted.      06/08/2017 Imaging    Ct angiogram 1.  Small pulmonary emboli, incompletely obstructing, in the left lower lobe pulmonary artery. No more central pulmonary emboli identified. No pulmonary emboli elsewhere seen. No right heart strain evident.  2. Nodular opacities in both lower lobes measuring 6 mm. Given widespread liver metastases, small metastatic pulmonary nodular lesions must be of concern. No consolidation. No larger parenchymal lung mass lesions.  3.  No evident thoracic adenopathy.  4. Enlarged liver with widespread metastatic disease, better evident on venous phase imaging from recent prior CT as opposed to this arterial phase study.  5. Rather subtle sclerosis in the T1 and T2 vertebral bodies. Concern for early sclerotic metastatic disease in these areas. No lytic or destructive bony lesions.  It must be noted that there is a degree of motion artifact associated with this study. It is quite possible that the area of concern for small incompletely obstructing pulmonary embolus in the left lower lobe represents motion artifact as opposed to actual pulmonary embolus. This finding is not seen convincingly on reformatted images and may indeed be artifactual. It is not felt that this equivocal focus of decreased attenuation is clinically significant, particularly in light of patient's overall clinical history.        06/08/2017 Imaging    ECHO: LVEF 60-65%, normal wall thickness, normal wall motion, grade 1 DD, indeterminate LV filling pressure, normal LA size, normal IVC, no pericardial effusion.      06/13/2017 Pathology Results    Liver, needle/core biopsy, lesion - HIGH GRADE B-CELL LYMPHOMA - SEE ONCOLOGY TABLE. Microscopic Comment LYMPHOMA Histologic type: Non-Hodgkin's lymphoma, favor large cell type. Grade (if applicable): High grade. Flow cytometry: Not performed since the specimen was received  in formalin. Immunohistochemical stains: BCL-2, BCL-6, CD138, CD20, CD3, CD30, CD34, LCA, CD43, CD5, CD79a, CD10,  cyclin D1 and TdT with appropriate controls. Touch preps/imprints: Not performed. Comments: The sections show liver parenchyma densely infiltrated by a relatively monomorphic population of large lymphoid-appearing cells displaying vesicular chromatin and small nucleoli. Many of the cells are lobated associated with apoptosis and brisk mitosis. The appearance is diffuse with lack of nodularity or atypical follicles. Flow cytometric analysis was not performed since no fresh material is available. Immunohistochemical stains were performed and show that the large atypical lymphoid cells are positive for LCA, CD20, CD79a, CD10, CD30, BCL-2, BCL-6, and CD43. In small foci, there is also coexpression with CD5. No significant staining is seen with CD34, CD138, cyclin D1 or TdT. There is an admixed minor population of T-cells in the background as primarily seen with CD3. The findings are most consistent with high grade B-cell lymphoma, large cell type and the phenotypic features favor GCB type.       She returns with her aunt and her mother today. The patient had progressive decline of overall performance status since recent discharge from the hospital. She has very poor appetite with poor oral intake. She is insulin-dependent diabetic, with her low oral intake, she had borderline low blood sugar and low blood pressure and she feel dizzy with overall fatigue all the time. She is also immobilized due to severe abdominal pain secondary to profound liver involvement. She denies nausea or vomiting. She take regular stool softener to avoid constipation. She takes on average several pain medicine over the weekend to control her pain. Her pain is rated at 8 out of 10 over the right upper quadrant area with movement and palpation.  MEDICAL HISTORY:  Past Medical History:  Diagnosis Date  . Cancer ([place])   . Congenital absence of one kidney   . Diabetes mellitus   . Hypertension   . Thyroid disease      SURGICAL HISTORY: Past Surgical History:  Procedure Laterality Date  . CESAREAN SECTION  2004    SOCIAL HISTORY: Social History   Socioeconomic History  . Marital status: Single    Spouse name: Not on file  . Number of children: Not on file  . Years of education: Not on file  . Highest education level: Not on file  Social Needs  . Financial resource strain: Not on file  . Food insecurity - worry: Not on file  . Food insecurity - inability: Not on file  . Transportation needs - medical: Not on file  . Transportation needs - non-medical: Not on file  Occupational History  . Not on file  Tobacco Use  . Smoking status: Former Smoker    Last attempt to quit: 06/06/2013    Years since quitting: 4.0  . Smokeless tobacco: Never Used  Substance and Sexual Activity  . Alcohol use: Yes  . Drug use: No  . Sexual activity: Yes    Birth control/protection: None  Other Topics Concern  . Not on file  Social History Narrative  . Not on file    FAMILY HISTORY: Family History  Problem Relation Age of Onset  . Peripheral vascular disease Mother   . Diabetes Father     ALLERGIES:  is allergic to oxycodone.  MEDICATIONS:  Current Facility-Administered Medications  Medication Dose Route Frequency Provider Last Rate Last Dose  . 0.9 %  sodium chloride infusion   Intravenous Continuous Alvy Bimler, Christinia Lambeth, MD      . acetaminophen (  TYLENOL) tablet 650 mg  650 mg Oral Q6H PRN Alvy Bimler, Prairie Stenberg, MD      . acyclovir (ZOVIRAX) tablet 400 mg  400 mg Oral Daily Ginnifer Creelman, MD      . alum & mag hydroxide-simeth (MAALOX/MYLANTA) 200-200-20 MG/5ML suspension 60 mL  60 mL Oral Q4H PRN Alvy Bimler, Joy Haegele, MD      . atenolol (TENORMIN) tablet 50 mg  50 mg Oral Daily Finas Delone, MD      . clonazePAM (KLONOPIN) tablet 0.5 mg  0.5 mg Oral TID PRN Alvy Bimler, Raelee Rossmann, MD      . dexamethasone (DECADRON) injection 8 mg  8 mg Intravenous Q24H Madox Corkins, MD      . docusate sodium (COLACE) capsule 100 mg  100 mg Oral BID  Cherye Gaertner, MD      . guaiFENesin-dextromethorphan (ROBITUSSIN DM) 100-10 MG/5ML syrup 10 mL  10 mL Oral Q4H PRN Alvy Bimler, Lemma Tetro, MD      . hydrocortisone (ANUSOL-HC) 2.5 % rectal cream 1 application  1 application Rectal BID PRN Alvy Bimler, Kishawn Pickar, MD      . HYDROmorphone (DILAUDID) injection 1 mg  1 mg Intravenous Q2H PRN Brittany Osier, MD      . insulin aspart (novoLOG) injection 0-24 Units  0-24 Units Subcutaneous TID WC Damieon Armendariz, Nava Song, MD      . insulin detemir (LEVEMIR) injection 15 Units  15 Units Subcutaneous QHS Alvy Bimler, Asher Babilonia, MD      . Derrill Memo ON 06/21/2017] levothyroxine (SYNTHROID, LEVOTHROID) tablet 200 mcg  200 mcg Oral QAC breakfast Alvy Bimler, Kayleena Eke, MD      . ondansetron (ZOFRAN) tablet 4-8 mg  4-8 mg Oral Q8H PRN Alvy Bimler, Lottie Sigman, MD       Or  . ondansetron (ZOFRAN-ODT) disintegrating tablet 4-8 mg  4-8 mg Oral Q8H PRN Alvy Bimler, Temiloluwa Recchia, MD       Or  . ondansetron (ZOFRAN) injection 4 mg  4 mg Intravenous Q8H PRN Brandonn Capelli, MD       Or  . ondansetron (ZOFRAN) 8 mg in sodium chloride 0.9 % 50 mL IVPB  8 mg Intravenous Q8H PRN Mikela Senn, MD      . senna-docusate (Senokot-S) tablet 1 tablet  1 tablet Oral QHS PRN Alvy Bimler, Amory Simonetti, MD      . sertraline (ZOLOFT) tablet 200 mg  200 mg Oral Daily Tashiba Timoney, MD        REVIEW OF SYSTEMS:   Constitutional: Denies fevers, chills or abnormal night sweats Eyes: Denies blurriness of vision, double vision or watery eyes Ears, nose, mouth, throat, and face: Denies mucositis or sore throat Respiratory: Denies cough, dyspnea or wheezes Cardiovascular: Denies palpitation, chest discomfort or lower extremity swelling Gastrointestinal:  Denies nausea, heartburn or change in bowel habits Skin: Denies abnormal skin rashes Lymphatics: Denies new lymphadenopathy or easy bruising Behavioral/Psych: Mood is stable, no new changes  All other systems were reviewed with the patient and are negative.  PHYSICAL EXAMINATION: ECOG PERFORMANCE STATUS: 2 - Symptomatic, <50% confined to  bed She is moderately obese, appears very debilitated, sitting on the wheelchair Her blood pressure is 100/72 new heart rate 103 Respiration rate 20 Temperature 97.6 GENERAL:alert, no distress and comfortable.  Scleral icterus is noted SKIN: skin color, texture, turgor are normal, no rashes or significant lesions EYES: normal, conjunctiva appears jaundiced OROPHARYNX:no exudate, no erythema and lips, buccal mucosa, and tongue normal  NECK: supple, thyroid normal size, non-tender, without nodularity LYMPH:  no palpable lymphadenopathy in the cervical, axillary or inguinal LUNGS: clear to  auscultation and percussion with normal breathing effort HEART: regular rate & rhythm and no murmurs and no lower extremity edema ABDOMEN:abdomen soft, abdomen distended with pain on gentle palpation on the right upper quadrant.  Massive liver enlargement Musculoskeletal:no cyanosis of digits and no clubbing  PSYCH: alert & oriented x 3 with fluent speech NEURO: no focal motor/sensory deficits  LABORATORY DATA:  I have reviewed the data as listed Lab Results  Component Value Date   WBC 9.5 06/13/2017   HGB 12.8 06/13/2017   HCT 39.3 06/13/2017   MCV 77.1 (L) 06/13/2017   PLT  06/13/2017    PLATELET CLUMPS NOTED ON SMEAR, COUNT APPEARS ADEQUATE   Recent Labs    06/06/17 2039  06/09/17 0300 06/10/17 0502 06/13/17 0536  NA 131*   < > 131* 133* 136  K 4.0   < > 4.3 4.8 4.8  CL 93*   < > 97* 99* 100*  CO2 21*   < > 20* 20* 23  GLUCOSE 115*   < > 137* 123* 105*  BUN 13   < > 37* 36* 24*  CREATININE 1.08*   < > 1.27* 1.12* 0.78  CALCIUM 9.1   < > 9.2 9.6 9.4  GFRNONAA >60   < > 51* 60* >60  GFRAA >60   < > 59* >60 >60  PROT 6.1*   < > 6.9 7.2 6.5  ALBUMIN 2.2*   < > 2.6* 2.6* 2.4*  AST 102*   < > 103* 93* 89*  ALT 40   < > 45 44 36  ALKPHOS 319*   < > 278* 287* 233*  BILITOT 8.2*   < > 7.0* 6.7* 6.0*  BILIDIR 4.8*  --   --  3.7*  --   IBILI  --   --   --  3.0*  --    < > = values in this  interval not displayed.    RADIOGRAPHIC STUDIES: I have personally reviewed the radiological images as listed and agreed with the findings in the report. Ct Angio Chest Pe W And/or Wo Contrast  Addendum Date: 06/09/2017   ADDENDUM REPORT: 06/09/2017 11:40 ADDENDUM: It must be noted that there is a degree of motion artifact associated with this study. It is quite possible that the area of concern for small incompletely obstructing pulmonary embolus in the left lower lobe represents motion artifact as opposed to actual pulmonary embolus. This finding is not seen convincingly on reformatted images and may indeed be artifactual. It is not felt that this equivocal focus of decreased attenuation is clinically significant, particularly in light of patient's overall clinical history. These results will be called to the ordering clinician or representative by the Radiologist Assistant, and communication documented in the PACS or zVision Dashboard. Electronically Signed   By: Lowella Grip III M.D.   On: 06/09/2017 11:40   Result Date: 06/09/2017 CLINICAL DATA:  Chest pain and elevated D-dimer. History of liver carcinoma EXAM: CT ANGIOGRAPHY CHEST WITH CONTRAST TECHNIQUE: Multidetector CT imaging of the chest was performed using the standard protocol during bolus administration of intravenous contrast. Multiplanar CT image reconstructions and MIPs were obtained to evaluate the vascular anatomy. CONTRAST:  100 mL Isovue 370 nonionic COMPARISON:  CT chest and abdomen May 10, 2017; chest radiograph May 13, 2017 FINDINGS: Cardiovascular: There are small incompletely obstructing pulmonary emboli in the left lower lobe pulmonary artery. No more central pulmonary emboli are evident. The right ventricle to left ventricle diameter ratio is less  than 0.9, not indicative of right heart strain. There is no thoracic aortic aneurysm or dissection. Visualized great vessels appear unremarkable. Right and left common  carotid arteries arise as a common trunk, an anatomic variant. Pericardium is not appreciably thickened. There is no pericardial effusion. Mediastinum/Nodes: Visualized thyroid is diminutive without focal lesion evident. There is no appreciable thoracic aortic adenopathy. No esophageal lesions are appreciable. Lungs/Pleura: There is a nodular opacity in the superior segment of the right lower lobe measuring 6 x 6 mm, appreciable on axial slice 53 series 7. There is a nearby nodular opacity on this same slice in the superior segment of the right lower lobe measuring 6 x 6 mm. There is an ill-defined opacity abutting the pleura in the superior segment of the left lower lobe seen on axial slice 45 series 7 measuring 6 x 6 mm. There is no edema or consolidation. There is slight bibasilar atelectasis. No pleural effusion or pleural thickening evident. Upper Abdomen: The liver is enlarged with widespread mass lesions, consistent with widespread metastatic disease to the liver. This appearance is better seen on venous phase imaging from most recent CT compared to arterial phase imaging on this current study. Visualized upper abdominal structures appear unremarkable. Musculoskeletal: There is upper thoracic levoscoliosis. There are no lytic or destructive bone lesions. There is subtle sclerosis in the T1 and T2 vertebral bodies, potentially representing sclerotic metastatic lesions. Review of the MIP images confirms the above findings. IMPRESSION: 1. Small pulmonary emboli, incompletely obstructing, in the left lower lobe pulmonary artery. No more central pulmonary emboli identified. No pulmonary emboli elsewhere seen. No right heart strain evident. 2. Nodular opacities in both lower lobes measuring 6 mm. Given widespread liver metastases, small metastatic pulmonary nodular lesions must be of concern. No consolidation. No larger parenchymal lung mass lesions. 3.  No evident thoracic adenopathy. 4. Enlarged liver with  widespread metastatic disease, better evident on venous phase imaging from recent prior CT as opposed to this arterial phase study. 5. Rather subtle sclerosis in the T1 and T2 vertebral bodies. Concern for early sclerotic metastatic disease in these areas. No lytic or destructive bony lesions. Critical Value/emergent results were called by telephone at the time of interpretation on 06/08/2017 at 11:54 am to Dr. Davonna Belling , who verbally acknowledged these results. Electronically Signed: By: Lowella Grip III M.D. On: 06/08/2017 11:55   Ct Abdomen Pelvis W Contrast  Result Date: 06/06/2017 CLINICAL DATA:  Abdominal distention, nausea, diarrhea, weakness, jaundice EXAM: CT ABDOMEN AND PELVIS WITH CONTRAST TECHNIQUE: Multidetector CT imaging of the abdomen and pelvis was performed using the standard protocol following bolus administration of intravenous contrast. CONTRAST:  149m ISOVUE-300 IOPAMIDOL (ISOVUE-300) INJECTION 61% COMPARISON:  05/10/2017 FINDINGS: Lower chest: Mixed cystic/ solid opacity in the posterior right lower lobe (series 5/ image 1), incompletely visualized. Hepatobiliary: Innumerable hepatic metastases throughout the liver. Gallbladder is unremarkable. No intrahepatic or extrahepatic ductal dilatation. Pancreas: Within normal limits. Spleen: Within normal limits. Adrenals/Urinary Tract: Adrenal glands are within normal limits. Cross fused renal ectopia.  No hydronephrosis. Bladder is within normal limits. Stomach/Bowel: Stomach is within normal limits. No evidence of bowel obstruction. Normal appendix (series 3/ image 78). Vascular/Lymphatic: No evidence of abdominal aortic aneurysm. 16 mm short axis node along the porta hepatis (series 3/ image 36). Additional small upper abdominal lymph nodes. Reproductive: Uterus is within normal limits. Right ovary is mildly prominent. Left ovary is grossly unremarkable. Other: No abdominopelvic ascites. Musculoskeletal: Visualized osseous  structures are within normal limits.  IMPRESSION: Innumerable hepatic metastases, grossly unchanged. Associated prominent upper abdominal lymph nodes, including a 16 mm short axis node along the porta hepatis, suspicious for nodal metastasis. Mixed cystic/solid lesion in the posterior right lower lobe, incompletely visualized. The appearance on prior CT chest at least raises concern for possible primary bronchogenic neoplasm. Consider PET-CT and/or liver biopsy for further evaluation, as clinically warranted. Electronically Signed   By: Julian Hy M.D.   On: 06/06/2017 20:54   US Abdomen Limited  Result Date: 06/11/2017 CLINICAL DATA:  41 year old with liver lesions and scheduled for liver biopsy. Liver biopsy was postponed due to incomplete lab tests prior to the procedure. EXAM: ULTRASOUND ABDOMEN LIMITED RIGHT UPPER QUADRANT COMPARISON:  CT 06/06/2017 and 05/10/2017.  Ultrasound 05/10/2017 FINDINGS: Images of the liver were obtained. Liver parenchyma is the diffusely heterogeneous, particularly the right hepatic lobe. Heterogeneity is due to innumerable hypoechoic liver lesions. The number of liver lesions has increased since the exam on 05/10/2017. IMPRESSION: Innumerable hypoechoic lesions throughout the liver, particularly in the right hepatic lobe. Findings are compatible with metastatic disease. Evidence for liver disease progression since 05/10/2017. Electronically Signed   By: Markus Daft M.D.   On: 06/11/2017 08:02   US Biopsy (liver)  Result Date: 06/13/2017 CLINICAL DATA:  Multiple liver lesions consistent metastatic disease. No known primary. EXAM: ULTRASOUND GUIDED CORE BIOPSY OF LIVER LESION MEDICATIONS: Intravenous Fentanyl and Versed were administered as conscious sedation during continuous monitoring of the patient's level of consciousness and physiological / cardiorespiratory status by the radiology RN, with a total moderate sedation time of 10 minutes. PROCEDURE: The procedure,  risks, benefits, and alternatives were explained to the patient. Questions regarding the procedure were encouraged and answered. The patient understands and consents to the procedure. Survey ultrasound of the liver was performed. Representative lesions were localized and an appropriate skin entry site was determined and marked. The operative field was prepped with chlorhexidine in a sterile fashion, and a sterile drape was applied covering the operative field. A sterile gown and sterile gloves were used for the procedure. Local anesthesia was provided with 1% Lidocaine. Under real-time ultrasound guidance, a 17 gauge trocar needle was advanced to the margin of the lesion. Once needle tip position was confirmed, coaxial 18-gauge core biopsy samples were obtained, submitted in formalin to surgical pathology. The guide needle was removed. The patient tolerated the procedure well. COMPLICATIONS: None. FINDINGS: Innumerable hypoechoic liver lesions were localized, corresponding to findings on previous CT. Representative core biopsies obtained as above IMPRESSION: 1. Technically successful ultrasound-guided liver lesion core biopsy Electronically Signed   By: Lucrezia Europe M.D.   On: 06/13/2017 09:14    ASSESSMENT & PLAN:   Newly diagnosed diffuse large B-cell lymphoma Given severity of liver involvement, I recommend inpatient hospitalization to expedite workup and to start her on treatment ASAP I have requested pathologist to add FISH analysis to rule out double hit lymphoma Due to the severity of disease, it is not possible to order outpatient PET CT scan for staging I reviewed her CT scan of the chest, abdomen and pelvis from October She had mildly enlarged intra-abdominal lymph node, diffuse liver enlargement and possible abnormalities in her lungs Staging is incomplete I will order CT-guided bone marrow biopsy for tomorrow She need port placement I will order hepatitis screen, uric acid and LDH to complete  workup I will start her on gentle IV fluid hydration I expect that her uric acid level to be very high She would benefit from rasburicase  or allopurinol I discussed with the patient and family members the rationale for admission and she agreed to proceed I anticipate high risk of tumor lysis syndrome I will start her on daily IV steroids to reduce the risk of infusion reaction Once her workup is complete, I will start her on dose adjusted R-EPOCH The recent for R-EPOCH and not R CHOP is due to her frail status and possible double hit lymphoma  We discussed the role of chemotherapy. The intent is for cure. We discussed some of the risks, benefits and side-effects of Rituximab,Cytoxan, Adriamycin,Vincristine and Solumedrol/Prednisone.   Some of the short term side-effects included, though not limited to, risk of fatigue, weight loss, tumor lysis syndrome, risk of allergic reactions, pancytopenia, life-threatening infections, need for transfusions of blood products, nausea, vomiting, change in bowel habits, hair loss, risk of congestive heart failure, admission to hospital for various reasons, and risks of death.   Long term side-effects are also discussed including permanent damage to nerve function, chronic fatigue, and rare secondary malignancy including bone marrow disorders.   The patient is aware that the response rates discussed earlier is not guaranteed.    After a long discussion, patient made an informed decision to proceed with the prescribed plan of care.  I will get chemo teaching to be done over the next 2 days while she is hospitalized  I will not start rituximab until the end of the week and I would likely modify her infusion rate to reduce risk of infusion reaction Depending on final staging of disease, she might need lumbar puncture and possible intrathecal treatment as well I will get her case presented at the next hematology tumor board  Anorexia Is likely secondary to  high risk lymphoma I will start her on steroid therapy She might benefit from dietitian review  Profound weakness She would benefit from physical therapy and rehab while hospitalized I will do the once her treatment is on the way and her pain is well controlled  Cancer associated pain I would discontinue hydrocodone and switch her to Dilaudid due to liver function abnormalities  Jaundice Liver enlargement with mild liver failure I will start her on hydration therapy I will modify her pain management  Solitary kidney She is at high risk of tumor lysis syndrome We will start her on gentle fluid hydration and monitor LDH and uric acid level I will discontinue Cozaar  Insulin dependent diabetes Due to poor oral intake, her blood sugar is borderline low I would reduce the dose of her Lantus/Levemir I will start her on insulin sliding scale and will check her blood sugar 3 times a day and adjust as needed I anticipate that her blood sugar may run high with steroids  DVT prophylaxis I will hold Lovenox in anticipation for invasive procedure tomorrow  CODE STATUS Full code  Discharge planning The patient is aware about high risk of death if she is not admitted and started on treatment due to progressive liver failure I anticipate she will be hospitalized for 5-7 days.  All questions were answered. The patient knows to call the clinic with any problems, questions or concerns. I spent 60 minutes counseling the patient face to face. The total time spent in the appointment was 80 minutes and more than 50% was on counseling.     Heath Lark, MD 07/02/2017 11:43 AM

## 2017-06-20 NOTE — Progress Notes (Signed)
START OFF PATHWAY REGIMEN - Lymphoma and CLL   [id number]:DA-EPOCH-R q21 days:   A cycle is every 21 days:     Rituximab      Etoposide      Doxorubicin      Vincristine      Cyclophosphamide      Prednisone   **Always confirm dose/schedule in your pharmacy ordering system**    Patient Characteristics: Diffuse Large B-Cell Lymphoma, First Line, Stage III and IV Disease Type: Not Applicable Disease Type: Diffuse Large B-Cell Lymphoma Disease Type: Not Applicable Line of therapy: First Line [place] Stage: IV Intent of Therapy: Curative Intent, Discussed with Patient

## 2017-06-20 NOTE — Progress Notes (Signed)
Referring Physician(s): Gorsuch,N  Supervising Physician: Sandi Mariscal  Patient Status:  [facility] - In-pt  Chief Complaint: Lymphoma   Subjective: Patient familiar to IR service from recent liver lesion biopsy on 06/13/17.  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  Request now received for CT-guided bone marrow biopsy for staging and Port-A-Cath placement for planned chemotherapy.  She currently denies fever, dyspnea, cough, back pain, vomiting or bleeding.  She does have some right upper abdominal discomfort, occasional nausea. Past Medical History:  Diagnosis Date  . Cancer ([place])   . Congenital absence of one kidney   . Diabetes mellitus   . Hypertension   . Thyroid disease    Past Surgical History:  Procedure Laterality Date  . CESAREAN SECTION  2004      Allergies: Oxycodone  Medications: Prior to Admission medications   Medication Sig Start Date End Date Taking? Authorizing Provider  acetaminophen (TYLENOL) 325 MG tablet Take 2 tablets (650 mg total) by mouth every 6 (six) hours as needed (for pain, fever above 101 and Headache.). 05/15/17  Yes Barton Dubois, MD  atenolol (TENORMIN) 50 MG tablet Take 50 mg by mouth daily.   Yes [provider]  clonazePAM (KLONOPIN) 0.5 MG tablet Take 0.5 mg by mouth 3 (three) times daily as needed for anxiety.    Yes [provider]  docusate sodium (COLACE) 100 MG capsule Take 1 capsule (100 mg total) 2 (two) times daily by mouth. 06/14/17  Yes Barton Dubois, MD  HYDROcodone-acetaminophen (NORCO/VICODIN) 5-325 MG tablet Take 1-2 tablets every 6 (six) hours as needed by mouth for severe pain. 06/14/17  Yes Barton Dubois, MD  insulin detemir (LEVEMIR) 100 unit/ml SOLN Inject 0.3 mLs (30 Units total) into the skin at bedtime. Patient taking differently: Inject 30 Units into the skin at bedtime as needed (low sugar).  05/15/17  Yes Barton Dubois, MD    Insulin Pen Needle 31G X 5 MM MISC Use as needed to inject insulin daily as instructed 05/15/17  Yes Barton Dubois, MD  levothyroxine (SYNTHROID, LEVOTHROID) 200 MCG tablet Take 200 mcg daily before breakfast by mouth.    Yes [provider]  losartan (COZAAR) 25 MG tablet Take 1 tablet (25 mg total) daily by mouth. 06/14/17  Yes Barton Dubois, MD  metoCLOPramide (REGLAN) 10 MG tablet Take 1 tablet (10 mg total) every 6 (six) hours as needed for up to 7 days by mouth for nausea. 06/16/17 06/23/17 Yes Gorsuch, Ni, MD  ondansetron (ZOFRAN) 4 MG tablet Take 1 tablet (4 mg total) by mouth every 6 (six) hours as needed for nausea or vomiting. 89/3/73  Yes Delora Fuel, MD  sertraline (ZOLOFT) 100 MG tablet Take 100 mg daily by mouth.    Yes [provider]  sitaGLIPtin (JANUVIA) 100 MG tablet Take 100 mg by mouth daily.   Yes [provider]     Vital Signs: BP 112/78   Pulse (!) 107   Temp 97.6 F (36.4 C) (Oral)   Resp 20   Ht 5' 6"  (1.676 m)   Wt 251 lb 1.7 oz (113.9 kg)   LMP 06/07/2017   SpO2 97%   BMI 40.53 kg/m   Physical Exam patient awake, alert.  Scleral icterus.  Chest clear to auscultation bilaterally.  Heart with regular rate and rhythm.  Abdomen soft, tender right upper quadrant with hepatomegaly, positive bowel sounds.  No lower extremity edema.  Imaging: No results  found.  Labs:  CBC: Recent Labs    06/09/17 0300 06/10/17 0502 06/13/17 0536 06/21/2017 1233  WBC 11.9* 11.6* 9.5 11.6*  HGB 12.8 13.0 12.8 14.1  HCT 38.9 40.3 39.3 42.5  PLT 284 PLATELET CLUMPS NOTED ON SMEAR, COUNT APPEARS ADEQUATE PLATELET CLUMPS NOTED ON SMEAR, COUNT APPEARS ADEQUATE PLATELET CLUMPS NOTED ON SMEAR, COUNT APPEARS ADEQUATE    COAGS: Recent Labs    05/13/17 1937 06/06/17 2039 06/13/17 0536  INR 1.26 1.15 1.04  APTT 36  --   --     BMP: Recent Labs    06/09/17 0300 06/10/17 0502 06/13/17 0536 07/07/2017 1233  NA 131* 133* 136 133*  K 4.3 4.8  4.8 3.4*  CL 97* 99* 100* 96*  CO2 20* 20* 23 25  GLUCOSE 137* 123* 105* 50*  BUN 37* 36* 24* 15  CALCIUM 9.2 9.6 9.4 9.4  CREATININE 1.27* 1.12* 0.78 0.76  GFRNONAA 51* 60* >60 >60  GFRAA 59* >60 >60 >60    LIVER FUNCTION TESTS: Recent Labs    06/09/17 0300 06/10/17 0502 06/13/17 0536 06/16/2017 1233  BILITOT 7.0* 6.7* 6.0* 8.3*  AST 103* 93* 89* 123*  ALT 45 44 36 55*  ALKPHOS 278* 287* 233* 311*  PROT 6.9 7.2 6.5 6.5  ALBUMIN 2.6* 2.6* 2.4* 2.5*    Assessment and Plan: Pt with history of newly diagnosed diffuse large B-cell lymphoma with progressive decline in performance status (liver failure, solitary kidney); request received for CT-guided bone marrow biopsy for staging and Port-A-Cath placement for chemotherapy.  Details/risk of procedures, including but not limited to, internal bleeding, infection, injury to adjacent structures, discussed with patient and family to their understanding and consent.  Procedures are tentatively scheduled for 11/13.   Electronically Signed: D. Rowe Robert, PA-C 07/01/2017, 5:30 PM   I spent a total of 30 minutes at the the patient's bedside AND on the patient's hospital floor or unit, greater than 50% of which was counseling/coordinating care for CT-guided bone marrow biopsy and Port-A-Cath placement    Patient ID: Patricia Mcclain, female   DOB: 1975/10/15, 41 y.o.   MRN: [id number]

## 2017-06-20 NOTE — Progress Notes (Signed)
This encounter was created in error - please disregard.

## 2017-06-21 ENCOUNTER — Inpatient Hospital Stay (HOSPITAL_COMMUNITY): Payer: Medicaid Other

## 2017-06-21 ENCOUNTER — Encounter (HOSPITAL_COMMUNITY): Payer: Self-pay | Admitting: Interventional Radiology

## 2017-06-21 DIAGNOSIS — R5383 Other fatigue: Secondary | ICD-10-CM

## 2017-06-21 HISTORY — PX: IR US GUIDE VASC ACCESS RIGHT: IMG2390

## 2017-06-21 HISTORY — PX: IR FLUORO GUIDE PORT INSERTION RIGHT: IMG5741

## 2017-06-21 LAB — CBC WITH DIFFERENTIAL/PLATELET
Basophils Absolute: 0.1 10*3/uL (ref 0.0–0.1)
Basophils Relative: 1 %
EOS PCT: 2 %
Eosinophils Absolute: 0.2 10*3/uL (ref 0.0–0.7)
HEMATOCRIT: 40.5 % (ref 36.0–46.0)
Hemoglobin: 12.7 g/dL (ref 12.0–15.0)
LYMPHS ABS: 1 10*3/uL (ref 0.7–4.0)
Lymphocytes Relative: 10 %
MCH: 24.9 pg — ABNORMAL LOW (ref 26.0–34.0)
MCHC: 31.4 g/dL (ref 30.0–36.0)
MCV: 79.3 fL (ref 78.0–100.0)
MONO ABS: 0.9 10*3/uL (ref 0.1–1.0)
Monocytes Relative: 9 %
Neutro Abs: 7.9 10*3/uL — ABNORMAL HIGH (ref 1.7–7.7)
Neutrophils Relative %: 78 %
Platelets: 168 10*3/uL (ref 150–400)
RBC: 5.11 MIL/uL (ref 3.87–5.11)
RDW: 28.4 % — AB (ref 11.5–15.5)
WBC: 10.1 10*3/uL (ref 4.0–10.5)

## 2017-06-21 LAB — COMPREHENSIVE METABOLIC PANEL
ALT: 52 U/L (ref 14–54)
AST: 107 U/L — ABNORMAL HIGH (ref 15–41)
Albumin: 2.2 g/dL — ABNORMAL LOW (ref 3.5–5.0)
Alkaline Phosphatase: 295 U/L — ABNORMAL HIGH (ref 38–126)
Anion gap: 11 (ref 5–15)
BUN: 17 mg/dL (ref 6–20)
CALCIUM: 9 mg/dL (ref 8.9–10.3)
CHLORIDE: 99 mmol/L — AB (ref 101–111)
CO2: 24 mmol/L (ref 22–32)
CREATININE: 0.85 mg/dL (ref 0.44–1.00)
Glucose, Bld: 52 mg/dL — ABNORMAL LOW (ref 65–99)
Potassium: 3.8 mmol/L (ref 3.5–5.1)
Sodium: 134 mmol/L — ABNORMAL LOW (ref 135–145)
TOTAL PROTEIN: 6 g/dL — AB (ref 6.5–8.1)
Total Bilirubin: 6.2 mg/dL — ABNORMAL HIGH (ref 0.3–1.2)

## 2017-06-21 LAB — GLUCOSE, CAPILLARY
GLUCOSE-CAPILLARY: 39 mg/dL — AB (ref 65–99)
GLUCOSE-CAPILLARY: 85 mg/dL (ref 65–99)
GLUCOSE-CAPILLARY: 137 mg/dL — AB (ref 65–99)
GLUCOSE-CAPILLARY: 127 mg/dL — AB (ref 65–99)

## 2017-06-21 LAB — HEPATITIS B SURFACE ANTIGEN: Hepatitis B Surface Ag: NEGATIVE

## 2017-06-21 LAB — HEPATITIS B CORE ANTIBODY, IGM: HEP B C IGM: NEGATIVE

## 2017-06-21 LAB — PROTIME-INR
INR: 1.11
Prothrombin Time: 14.2 seconds (ref 11.4–15.2)

## 2017-06-21 LAB — HEPATITIS B SURFACE ANTIBODY,QUALITATIVE: Hep B S Ab: NONREACTIVE

## 2017-06-21 MED ORDER — BOOST PLUS PO LIQD
237.0000 mL | Freq: Three times a day (TID) | ORAL | Status: DC
  Administered 2017-06-22: 237 mL via ORAL
  Filled 2017-06-21 (×7): qty 237

## 2017-06-21 MED ORDER — MIDAZOLAM HCL 2 MG/2ML IJ SOLN
INTRAMUSCULAR | Status: AC
  Filled 2017-06-21: qty 6

## 2017-06-21 MED ORDER — ADULT MULTIVITAMIN W/MINERALS CH
1.0000 | ORAL_TABLET | Freq: Every day | ORAL | Status: DC
  Administered 2017-06-21 – 2017-06-26 (×5): 1 via ORAL
  Filled 2017-06-21 (×5): qty 1

## 2017-06-21 MED ORDER — LIDOCAINE-EPINEPHRINE (PF) 2 %-1:200000 IJ SOLN
INTRAMUSCULAR | Status: AC
  Filled 2017-06-21: qty 20

## 2017-06-21 MED ORDER — LIDOCAINE HCL (PF) 1 % IJ SOLN
INTRAMUSCULAR | Status: AC | PRN
  Administered 2017-06-21: 20 mL

## 2017-06-21 MED ORDER — FENTANYL CITRATE (PF) 100 MCG/2ML IJ SOLN
INTRAMUSCULAR | Status: AC
  Filled 2017-06-21: qty 4

## 2017-06-21 MED ORDER — MIDAZOLAM HCL 2 MG/2ML IJ SOLN
INTRAMUSCULAR | Status: AC | PRN
  Administered 2017-06-21: 1 mg via INTRAVENOUS

## 2017-06-21 MED ORDER — DEXTROSE 50 % IV SOLN
INTRAVENOUS | Status: AC
  Administered 2017-06-21: 50 mL
  Filled 2017-06-21: qty 50

## 2017-06-21 MED ORDER — MIDAZOLAM HCL 2 MG/2ML IJ SOLN
INTRAMUSCULAR | Status: AC | PRN
  Administered 2017-06-21 (×2): 1 mg via INTRAVENOUS

## 2017-06-21 MED ORDER — FENTANYL CITRATE (PF) 100 MCG/2ML IJ SOLN
INTRAMUSCULAR | Status: AC | PRN
  Administered 2017-06-21 (×2): 50 ug via INTRAVENOUS

## 2017-06-21 MED ORDER — LIDOCAINE-EPINEPHRINE (PF) 1 %-1:200000 IJ SOLN
INTRAMUSCULAR | Status: AC | PRN
  Administered 2017-06-21: 20 mL

## 2017-06-21 MED ORDER — DIPHENHYDRAMINE HCL 50 MG PO CAPS: 50.0000 mg | ORAL_CAPSULE | Freq: Every evening | ORAL | Status: DC | PRN

## 2017-06-21 MED ORDER — CEFAZOLIN SODIUM-DEXTROSE 2-4 GM/100ML-% IV SOLN
INTRAVENOUS | Status: AC
  Filled 2017-06-21: qty 100

## 2017-06-21 MED ORDER — DEXTROSE 50 % IV SOLN
1.0000 | Freq: Once | INTRAVENOUS | Status: AC
  Administered 2017-06-21: 50 mL via INTRAVENOUS

## 2017-06-21 NOTE — Procedures (Signed)
Pre-procedure Diagnosis: Lymphoma Post-procedure Diagnosis: Same  Technically successful CT guided bone marrow aspiration and biopsy of left iliac crest.   Complications: None Immediate  EBL: None  SignedSandi Mariscal Pager: [phone] 06/21/2017, 9:12 AM

## 2017-06-21 NOTE — Progress Notes (Signed)
   06/21/17 1553  Clinical Encounter Type  Visited With Patient and family together  Visit Type Initial  Referral From Nurse  Consult/Referral To Chaplain  Spiritual Encounters  Spiritual Needs Prayer  Stress Factors  Patient Stress Factors Health changes   Responding to a SCC for a Healthcare Power of Attorney.  Patient was receptive and wanted the information.  She had a procedure this morning and was a little groggy. I went over the information and could tell it was a lot to think about at her age.  I told her I would leave the information and come back tomorrow.  I offered to pray and she accepted and seemed to find some sense of peace in that moment.  Will follow up on AD.   Chaplain Katherene Ponto

## 2017-06-21 NOTE — Progress Notes (Signed)
Initial Nutrition Assessment  DOCUMENTATION CODES:   Obesity unspecified, Non-severe (moderate) malnutrition in context of acute illness/injury  INTERVENTION:   -Provide Boost Plus TID- Each supplement provides 360kcal and 14g protein.   -Provide Multivitamin with minerals daily -Encourage PO intake -RD will continue to monitor  NUTRITION DIAGNOSIS:   Moderate Malnutrition related to acute illness(new diagnosis of DLBCL) as evidenced by percent weight loss, energy intake < 75% for > 7 days.  GOAL:   Patient will meet greater than or equal to 90% of their needs  MONITOR:   PO intake, Supplement acceptance, Labs, Weight trends, I & O's  REASON FOR ASSESSMENT:   Malnutrition Screening Tool    ASSESSMENT:   41 y.o. female is admitted for high dose, inpatient chemotherapy. Pt with history of Diffuse large B-cell lymphoma, progressive decline in performance status with signs of liver failure, solitary kidney.  Pt in room with mother at bedside. Pt with visibly distended stomach. Pt reports poor appetite for a few months d/t N/V. Pt states she was not eating anything during this time period.Today she states she is feeling hungry and ready to eat. Diet was advanced to regular diet. Pt states PTA she was drinking Boost drinks the past 2 weeks. RD to order Boost Plus. Pt states that prior to developing N/V a few months ago, she was "loving to eat" with good appetite. Pt states she had 1/2 sub sandwich yesterday and tolerated this.  Per chart review, pt has lost 19 lb since 10/2 (7% wt loss x 1.5 months, significant for time frame).   Medications: IV Decadron daily, Colace capsule BID, IV Zofran PRN Labs reviewed: CBGs: 85 Low Na  NUTRITION - FOCUSED PHYSICAL EXAM:  Nutrition focused physical exam shows no sign of depletion of muscle mass or body fat.  Diet Order:  Diet regular Room service appropriate? Yes; Fluid consistency: Thin  EDUCATION NEEDS:   No education needs have  been identified at this time  Skin:  Skin Assessment: Reviewed RN Assessment  Last BM:  11/12  Height:   Ht Readings from Last 1 Encounters:  06/25/2017 5\' 6"  (1.676 m)    Weight:   Wt Readings from Last 1 Encounters:  06/29/2017 251 lb 1.7 oz (113.9 kg)    Ideal Body Weight:  59.1 kg  BMI:  Body mass index is 40.53 kg/m.  Estimated Nutritional Needs:   Kcal:  2100-2300  Protein:  100-110g  Fluid:  2.1L/day  Clayton Bibles, MS, RD, LDN [place] Dietitian Pager: [phone] After Hours Pager: [phone]

## 2017-06-21 NOTE — Progress Notes (Signed)
Lab blood sugar-52 glucoscan at this time 39 1 amp d 50 given

## 2017-06-21 NOTE — Procedures (Signed)
Pre Procedure Dx: Lymphoma Post Procedural Dx: Same  Successful placement of right IJ approach port-a-cath with tip at the superior caval atrial junction. The catheter is ready for immediate use.  Estimated Blood Loss: Minimal  Complications: None immediate.  Jay Lesta Limbert, MD Pager #: [phone]   

## 2017-06-21 NOTE — Discharge Instructions (Signed)
Implanted Port Home Guide An implanted port is a type of central line that is placed under the skin. Central lines are used to provide IV access when treatment or nutrition needs to be given through a person's veins. Implanted ports are used for long-term IV access. An implanted port may be placed because:  You need IV medicine that would be irritating to the small veins in your hands or arms.  You need long-term IV medicines, such as antibiotics.  You need IV nutrition for a long period.  You need frequent blood draws for lab tests.  You need dialysis.  Implanted ports are usually placed in the chest area, but they can also be placed in the upper arm, the abdomen, or the leg. An implanted port has two main parts:  Reservoir. The reservoir is round and will appear as a small, raised area under your skin. The reservoir is the part where a needle is inserted to give medicines or draw blood.  Catheter. The catheter is a thin, flexible tube that extends from the reservoir. The catheter is placed into a large vein. Medicine that is inserted into the reservoir goes into the catheter and then into the vein.  How will I care for my incision site? Do not get the incision site wet. Bathe or shower as directed by your health care provider. How is my port accessed? Special steps must be taken to access the port:  Before the port is accessed, a numbing cream can be placed on the skin. This helps numb the skin over the port site.  Your health care provider uses a sterile technique to access the port. ? Your health care provider must put on a mask and sterile gloves. ? The skin over your port is cleaned carefully with an antiseptic and allowed to dry. ? The port is gently pinched between sterile gloves, and a needle is inserted into the port.  Only "non-coring" port needles should be used to access the port. Once the port is accessed, a blood return should be checked. This helps ensure that the port  is in the vein and is not clogged.  If your port needs to remain accessed for a constant infusion, a clear (transparent) bandage will be placed over the needle site. The bandage and needle will need to be changed every week, or as directed by your health care provider.  Keep the bandage covering the needle clean and dry. Do not get it wet. Follow your health care provider's instructions on how to take a shower or bath while the port is accessed.  If your port does not need to stay accessed, no bandage is needed over the port.  What is flushing? Flushing helps keep the port from getting clogged. Follow your health care provider's instructions on how and when to flush the port. Ports are usually flushed with saline solution or a medicine called heparin. The need for flushing will depend on how the port is used.  If the port is used for intermittent medicines or blood draws, the port will need to be flushed: ? After medicines have been given. ? After blood has been drawn. ? As part of routine maintenance.  If a constant infusion is running, the port may not need to be flushed.  How long will my port stay implanted? The port can stay in for as long as your health care provider thinks it is needed. When it is time for the port to come out, surgery will be   done to remove it. The procedure is similar to the one performed when the port was put in. When should I seek immediate medical care? When you have an implanted port, you should seek immediate medical care if:  You notice a bad smell coming from the incision site.  You have swelling, redness, or drainage at the incision site.  You have more swelling or pain at the port site or the surrounding area.  You have a fever that is not controlled with medicine.  This information is not intended to replace advice given to you by your health care provider. Make sure you discuss any questions you have with your health care provider. Document  Released: 07/26/2005 Document Revised: 01/01/2016 Document Reviewed: 04/02/2013 Elsevier Interactive Patient Education  2017 [employer]  

## 2017-06-21 NOTE — Progress Notes (Signed)
Patricia Mcclain   DOB:Apr 06, 1976   FK#:[id number]    Subjective: She feels well.  Pain is under good control.  She denies constipation.  She was noted to have hypoglycemia. No nausea or vomiting.  Bone marrow biopsy and port placement are pending  Objective:  Vitals:   06/28/2017 2041 06/21/17 0411  BP: 115/74 100/69  Pulse: (!) 102 83  Resp: 20 19  Temp: 98 F (36.7 C) 97.9 F (36.6 C)  SpO2: 98% 95%     Intake/Output Summary (Last 24 hours) at 06/21/2017 1749 Last data filed at 06/21/2017 4496 Gross per 24 hour  Intake 855 ml  Output 400 ml  Net 455 ml    GENERAL:alert, no distress and comfortable SKIN: skin color, texture, turgor are normal, no rashes or significant lesions EYES: normal, Conjunctiva are jaundiced Musculoskeletal:no cyanosis of digits and no clubbing  NEURO: alert & oriented x 3 with fluent speech, no focal motor/sensory deficits   Labs:  Lab Results  Component Value Date   WBC 10.1 06/21/2017   HGB 12.7 06/21/2017   HCT 40.5 06/21/2017   MCV 79.3 06/21/2017   PLT 168 06/21/2017   NEUTROABS 7.9 (H) 06/21/2017    Lab Results  Component Value Date   NA 134 (L) 06/21/2017   K 3.8 06/21/2017   CL 99 (L) 06/21/2017   CO2 24 06/21/2017   Assessment & Plan:   Newly diagnosed diffuse large B-cell lymphoma Given severity of liver involvement, I recommend inpatient hospitalization to expedite workup and to start her on treatment ASAP I have requested pathologist to add FISH analysis to rule out double hit lymphoma Due to the severity of disease, it is not possible to order outpatient PET CT scan for staging I reviewed her CT scan of the chest, abdomen and pelvis from October She had mildly enlarged intra-abdominal lymph node, diffuse liver enlargement and possible abnormalities in her lungs Staging is incomplete I will order CT-guided bone marrow biopsy for tomorrow She need port placement I anticipate high risk of tumor lysis syndrome I will start her  on daily IV steroids to reduce the risk of infusion reaction Please see documented consent from June 20, 2017 Due to abnormal liver enzymes, I plan to reduce a dose of for cycle of chemotherapy except for rituximab by 25%  I will not start rituximab until the end of the week and I would likely modify her infusion rate to reduce risk of infusion reaction Depending on final staging of disease, she might need lumbar puncture and possible intrathecal treatment as well I will get her case presented at the next hematology tumor board  Anorexia Is likely secondary to high risk lymphoma She is started on steroid therapy She might benefit from dietitian review  Profound weakness She would benefit from physical therapy and rehab while hospitalized  Cancer associated pain I would discontinue hydrocodone and switch her to Dilaudid due to liver function abnormalities  Jaundice Liver enlargement with mild liver failure I will start her on hydration therapy, mildly improved I will modify her pain management  Solitary kidney She is at high risk of tumor lysis syndrome We will start her on gentle fluid hydration and monitor LDH and uric acid level I will discontinue Cozaar  Hyperuricemia She received 1 dose of rasburicase on June 20, 2017 She will continue gentle IV fluid hydration and repeat uric acid tomorrow Once her level is reasonable, I will start her on high-dose allopurinol  Insulin dependent diabetes Due to  poor oral intake, her blood sugar is borderline low I would discontinue long-acting insulin I will start her on insulin sliding scale and will check her blood sugar 3 times a day and adjust as needed I anticipate that her blood sugar may run high with steroids  DVT prophylaxis I will hold Lovenox in anticipation for invasive procedure tomorrow  CODE STATUS Full code  Discharge planning The patient is aware about high risk of death if she is not admitted and  started on treatment due to progressive liver failure I anticipate she will be hospitalized for 5-7 days.  Heath Lark, MD 06/21/2017  8:14 AM

## 2017-06-22 ENCOUNTER — Inpatient Hospital Stay (HOSPITAL_COMMUNITY): Payer: Medicaid Other

## 2017-06-22 DIAGNOSIS — K7200 Acute and subacute hepatic failure without coma: Secondary | ICD-10-CM

## 2017-06-22 DIAGNOSIS — E1122 Type 2 diabetes mellitus with diabetic chronic kidney disease: Secondary | ICD-10-CM

## 2017-06-22 DIAGNOSIS — E790 Hyperuricemia without signs of inflammatory arthritis and tophaceous disease: Secondary | ICD-10-CM

## 2017-06-22 DIAGNOSIS — R945 Abnormal results of liver function studies: Secondary | ICD-10-CM

## 2017-06-22 DIAGNOSIS — N19 Unspecified kidney failure: Secondary | ICD-10-CM

## 2017-06-22 DIAGNOSIS — E875 Hyperkalemia: Secondary | ICD-10-CM

## 2017-06-22 DIAGNOSIS — N179 Acute kidney failure, unspecified: Secondary | ICD-10-CM

## 2017-06-22 DIAGNOSIS — K7290 Hepatic failure, unspecified without coma: Secondary | ICD-10-CM

## 2017-06-22 DIAGNOSIS — R579 Shock, unspecified: Secondary | ICD-10-CM

## 2017-06-22 DIAGNOSIS — R7989 Other specified abnormal findings of blood chemistry: Secondary | ICD-10-CM

## 2017-06-22 DIAGNOSIS — N182 Chronic kidney disease, stage 2 (mild): Secondary | ICD-10-CM

## 2017-06-22 DIAGNOSIS — D696 Thrombocytopenia, unspecified: Secondary | ICD-10-CM

## 2017-06-22 DIAGNOSIS — G9340 Encephalopathy, unspecified: Secondary | ICD-10-CM

## 2017-06-22 DIAGNOSIS — G893 Neoplasm related pain (acute) (chronic): Secondary | ICD-10-CM

## 2017-06-22 DIAGNOSIS — E11649 Type 2 diabetes mellitus with hypoglycemia without coma: Secondary | ICD-10-CM

## 2017-06-22 DIAGNOSIS — J810 Acute pulmonary edema: Secondary | ICD-10-CM

## 2017-06-22 DIAGNOSIS — Q600 Renal agenesis, unilateral: Secondary | ICD-10-CM

## 2017-06-22 DIAGNOSIS — D689 Coagulation defect, unspecified: Secondary | ICD-10-CM

## 2017-06-22 DIAGNOSIS — R748 Abnormal levels of other serum enzymes: Secondary | ICD-10-CM

## 2017-06-22 LAB — PROTIME-INR
INR: 1.33
PROTHROMBIN TIME: 16.4 s — AB (ref 11.4–15.2)

## 2017-06-22 LAB — BASIC METABOLIC PANEL
ANION GAP: 17 — AB (ref 5–15)
BUN: 33 mg/dL — ABNORMAL HIGH (ref 6–20)
CHLORIDE: 101 mmol/L (ref 101–111)
CO2: 17 mmol/L — ABNORMAL LOW (ref 22–32)
Calcium: 7.9 mg/dL — ABNORMAL LOW (ref 8.9–10.3)
Creatinine, Ser: 2.4 mg/dL — ABNORMAL HIGH (ref 0.44–1.00)
GFR calc Af Amer: 28 mL/min — ABNORMAL LOW (ref >60)
GFR, EST NON AFRICAN AMERICAN: 24 mL/min — AB (ref >60)
Glucose, Bld: 123 mg/dL — ABNORMAL HIGH (ref 65–99)
POTASSIUM: 5.9 mmol/L — AB (ref 3.5–5.1)
SODIUM: 135 mmol/L (ref 135–145)

## 2017-06-22 LAB — CBC WITH DIFFERENTIAL/PLATELET
BASOS ABS: 0.1 10*3/uL (ref 0.0–0.1)
Basophils Relative: 1 %
EOS ABS: 0.3 10*3/uL (ref 0.0–0.7)
Eosinophils Relative: 3 %
HCT: 41.6 % (ref 36.0–46.0)
Hemoglobin: 13.2 g/dL (ref 12.0–15.0)
LYMPHS ABS: 1.5 10*3/uL (ref 0.7–4.0)
Lymphocytes Relative: 14 %
MCH: 25.6 pg — AB (ref 26.0–34.0)
MCHC: 31.7 g/dL (ref 30.0–36.0)
MCV: 80.8 fL (ref 78.0–100.0)
Monocytes Absolute: 0.8 10*3/uL (ref 0.1–1.0)
Monocytes Relative: 7 %
NEUTROS ABS: 8 10*3/uL — AB (ref 1.7–7.7)
Neutrophils Relative %: 75 %
PLATELETS: 196 10*3/uL (ref 150–400)
RBC: 5.15 MIL/uL — ABNORMAL HIGH (ref 3.87–5.11)
WBC: 10.7 10*3/uL — ABNORMAL HIGH (ref 4.0–10.5)

## 2017-06-22 LAB — COMPREHENSIVE METABOLIC PANEL
ALBUMIN: 2.5 g/dL — AB (ref 3.5–5.0)
ALK PHOS: 358 U/L — AB (ref 38–126)
ALT: 299 U/L — ABNORMAL HIGH (ref 14–54)
ALT: 446 U/L — AB (ref 14–54)
ALT: 461 U/L — ABNORMAL HIGH (ref 14–54)
ANION GAP: 14 (ref 5–15)
ANION GAP: 16 — AB (ref 5–15)
AST: 1252 U/L — ABNORMAL HIGH (ref 15–41)
AST: 2245 U/L — ABNORMAL HIGH (ref 15–41)
AST: 2347 U/L — ABNORMAL HIGH (ref 15–41)
Albumin: 2.5 g/dL — ABNORMAL LOW (ref 3.5–5.0)
Albumin: 2.4 g/dL — ABNORMAL LOW (ref 3.5–5.0)
Alkaline Phosphatase: 409 U/L — ABNORMAL HIGH (ref 38–126)
Alkaline Phosphatase: 437 U/L — ABNORMAL HIGH (ref 38–126)
Anion gap: 14 (ref 5–15)
BUN: 30 mg/dL — ABNORMAL HIGH (ref 6–20)
BUN: 33 mg/dL — ABNORMAL HIGH (ref 6–20)
BUN: 32 mg/dL — ABNORMAL HIGH (ref 6–20)
CALCIUM: 8.8 mg/dL — AB (ref 8.9–10.3)
CALCIUM: 8.1 mg/dL — AB (ref 8.9–10.3)
CHLORIDE: 100 mmol/L — AB (ref 101–111)
CHLORIDE: 100 mmol/L — AB (ref 101–111)
CO2: 20 mmol/L — AB (ref 22–32)
CO2: 21 mmol/L — ABNORMAL LOW (ref 22–32)
CO2: 19 mmol/L — ABNORMAL LOW (ref 22–32)
CREATININE: 2.18 mg/dL — AB (ref 0.44–1.00)
CREATININE: 2.3 mg/dL — AB (ref 0.44–1.00)
Calcium: 8.5 mg/dL — ABNORMAL LOW (ref 8.9–10.3)
Chloride: 99 mmol/L — ABNORMAL LOW (ref 101–111)
Creatinine, Ser: 2.06 mg/dL — ABNORMAL HIGH (ref 0.44–1.00)
GFR calc non Af Amer: 29 mL/min — ABNORMAL LOW (ref >60)
GFR, EST AFRICAN AMERICAN: 33 mL/min — AB (ref >60)
GFR, EST AFRICAN AMERICAN: 31 mL/min — AB (ref >60)
GFR, EST AFRICAN AMERICAN: 29 mL/min — AB (ref >60)
GFR, EST NON AFRICAN AMERICAN: 27 mL/min — AB (ref >60)
GFR, EST NON AFRICAN AMERICAN: 25 mL/min — AB (ref >60)
Glucose, Bld: 107 mg/dL — ABNORMAL HIGH (ref 65–99)
Glucose, Bld: 93 mg/dL (ref 65–99)
Glucose, Bld: 120 mg/dL — ABNORMAL HIGH (ref 65–99)
POTASSIUM: 6.3 mmol/L — AB (ref 3.5–5.1)
POTASSIUM: 5.8 mmol/L — AB (ref 3.5–5.1)
Potassium: 6 mmol/L — ABNORMAL HIGH (ref 3.5–5.1)
SODIUM: 133 mmol/L — AB (ref 135–145)
SODIUM: 135 mmol/L (ref 135–145)
Sodium: 135 mmol/L (ref 135–145)
Total Bilirubin: 5.7 mg/dL — ABNORMAL HIGH (ref 0.3–1.2)
Total Bilirubin: 6 mg/dL — ABNORMAL HIGH (ref 0.3–1.2)
Total Bilirubin: 5.8 mg/dL — ABNORMAL HIGH (ref 0.3–1.2)
Total Protein: 6.8 g/dL (ref 6.5–8.1)
Total Protein: 6.5 g/dL (ref 6.5–8.1)
Total Protein: 6.5 g/dL (ref 6.5–8.1)

## 2017-06-22 LAB — GLUCOSE, CAPILLARY
GLUCOSE-CAPILLARY: 90 mg/dL (ref 65–99)
GLUCOSE-CAPILLARY: 99 mg/dL (ref 65–99)
GLUCOSE-CAPILLARY: 129 mg/dL — AB (ref 65–99)
Glucose-Capillary: 174 mg/dL — ABNORMAL HIGH (ref 65–99)

## 2017-06-22 LAB — MRSA PCR SCREENING: MRSA BY PCR: NEGATIVE

## 2017-06-22 LAB — LACTIC ACID, PLASMA
LACTIC ACID, VENOUS: 3.9 mmol/L — AB (ref 0.5–1.9)
Lactic Acid, Venous: 4.6 mmol/L (ref 0.5–1.9)

## 2017-06-22 LAB — PHOSPHORUS: PHOSPHORUS: 10.5 mg/dL — AB (ref 2.5–4.6)

## 2017-06-22 LAB — BLOOD GAS, ARTERIAL
ACID-BASE DEFICIT: 7.8 mmol/L — AB (ref 0.0–2.0)
BICARBONATE: 19 mmol/L — AB (ref 20.0–28.0)
DRAWN BY: 257881
FIO2: 21
O2 SAT: 89.7 %
PATIENT TEMPERATURE: 98.6
pCO2 arterial: 45.4 mmHg (ref 32.0–48.0)
pH, Arterial: 7.245 — ABNORMAL LOW (ref 7.350–7.450)
pO2, Arterial: 74.5 mmHg — ABNORMAL LOW (ref 83.0–108.0)

## 2017-06-22 LAB — CREATININE, URINE, RANDOM: Creatinine, Urine: 148.48 mg/dL

## 2017-06-22 LAB — URINALYSIS, ROUTINE W REFLEX MICROSCOPIC
GLUCOSE, UA: NEGATIVE mg/dL
KETONES UR: NEGATIVE mg/dL
NITRITE: NEGATIVE
PROTEIN: 100 mg/dL — AB
Specific Gravity, Urine: 1.015 (ref 1.005–1.030)
pH: 5 (ref 5.0–8.0)

## 2017-06-22 LAB — URIC ACID: Uric Acid, Serum: 13 mg/dL — ABNORMAL HIGH (ref 2.3–6.6)

## 2017-06-22 LAB — APTT: aPTT: 38 seconds — ABNORMAL HIGH (ref 24–36)

## 2017-06-22 LAB — SODIUM, URINE, RANDOM: Sodium, Ur: 46 mmol/L

## 2017-06-22 MED ORDER — SODIUM POLYSTYRENE SULFONATE 15 GM/60ML PO SUSP
30.0000 g | Freq: Once | ORAL | Status: AC
  Administered 2017-06-22: 30 g via ORAL
  Filled 2017-06-22: qty 120

## 2017-06-22 MED ORDER — SODIUM CHLORIDE 0.9% FLUSH
10.0000 mL | Freq: Two times a day (BID) | INTRAVENOUS | Status: DC
  Administered 2017-06-23 – 2017-06-26 (×6): 10 mL

## 2017-06-22 MED ORDER — SODIUM CHLORIDE 0.9 % IV SOLN
INTRAVENOUS | Status: AC
  Administered 2017-06-22: 06:00:00 via INTRAVENOUS

## 2017-06-22 MED ORDER — SODIUM POLYSTYRENE SULFONATE 15 GM/60ML PO SUSP
30.0000 g | Freq: Three times a day (TID) | ORAL | Status: AC
  Administered 2017-06-22 – 2017-06-23 (×3): 30 g via ORAL
  Filled 2017-06-22 (×5): qty 120

## 2017-06-22 MED ORDER — MORPHINE SULFATE (PF) 4 MG/ML IV SOLN: 1.0000 mg | INTRAVENOUS | Status: DC | PRN

## 2017-06-22 MED ORDER — SODIUM CHLORIDE 0.9 % IV BOLUS (SEPSIS)
1000.0000 mL | Freq: Once | INTRAVENOUS | Status: AC
  Administered 2017-06-22: 1000 mL via INTRAVENOUS

## 2017-06-22 MED ORDER — SODIUM CHLORIDE 0.9 % IV BOLUS (SEPSIS): 3000.0000 mL | Freq: Once | INTRAVENOUS | Status: DC

## 2017-06-22 MED ORDER — FENTANYL CITRATE (PF) 100 MCG/2ML IJ SOLN
25.0000 ug | INTRAMUSCULAR | Status: DC | PRN
  Administered 2017-06-22: 25 ug via INTRAVENOUS
  Filled 2017-06-22: qty 2

## 2017-06-22 MED ORDER — SODIUM BICARBONATE 8.4 % IV SOLN
100.0000 meq | Freq: Once | INTRAVENOUS | Status: DC
  Filled 2017-06-22: qty 100

## 2017-06-22 MED ORDER — SODIUM CHLORIDE 0.9 % IV BOLUS (SEPSIS): 1000.0000 mL | Freq: Once | INTRAVENOUS | Status: DC

## 2017-06-22 MED ORDER — SODIUM BICARBONATE 8.4 % IV SOLN
INTRAVENOUS | Status: DC
  Administered 2017-06-22 – 2017-06-23 (×3): via INTRAVENOUS
  Filled 2017-06-22 (×3): qty 150

## 2017-06-22 MED ORDER — SODIUM CHLORIDE 0.9% FLUSH: 10.0000 mL | INTRAVENOUS | Status: DC | PRN

## 2017-06-22 MED ORDER — FUROSEMIDE 10 MG/ML IJ SOLN
40.0000 mg | Freq: Once | INTRAMUSCULAR | Status: DC
  Filled 2017-06-22: qty 4

## 2017-06-22 MED ORDER — CHLORHEXIDINE GLUCONATE CLOTH 2 % EX PADS
6.0000 | MEDICATED_PAD | Freq: Every day | CUTANEOUS | Status: DC
  Administered 2017-06-22 – 2017-06-26 (×5): 6 via TOPICAL

## 2017-06-22 MED ORDER — SODIUM CHLORIDE 0.9 % IV BOLUS (SEPSIS)
2000.0000 mL | Freq: Once | INTRAVENOUS | Status: AC
  Administered 2017-06-22: 2000 mL via INTRAVENOUS

## 2017-06-22 MED ORDER — HYDROMORPHONE HCL 1 MG/ML IJ SOLN: 0.5000 mg | INTRAMUSCULAR | Status: DC | PRN

## 2017-06-22 MED ORDER — SODIUM CHLORIDE 0.9 % IV SOLN
6.0000 mg | Freq: Once | INTRAVENOUS | Status: AC
  Administered 2017-06-22: 6 mg via INTRAVENOUS
  Filled 2017-06-22: qty 4

## 2017-06-22 MED ORDER — LIDOCAINE-PRILOCAINE 2.5-2.5 % EX CREA
TOPICAL_CREAM | CUTANEOUS | Status: DC | PRN
  Filled 2017-06-22: qty 5

## 2017-06-22 MED ORDER — NOREPINEPHRINE BITARTRATE 1 MG/ML IV SOLN
0.0000 ug/min | INTRAVENOUS | Status: DC
  Administered 2017-06-22: 5 ug/min via INTRAVENOUS
  Administered 2017-06-23: 10 ug/min via INTRAVENOUS
  Filled 2017-06-22 (×3): qty 4

## 2017-06-22 NOTE — Consult Note (Signed)
PULMONARY / CRITICAL CARE MEDICINE   Name: Patricia Mcclain MRN: [id number] DOB: Jan 02, 1976    ADMISSION DATE:  06/23/2017 CONSULTATION DATE:  11/14  REFERRING MD:  Dr. [place] Pink, Darnell Level   CHIEF COMPLAINT:  Encephalopathy  HISTORY OF PRESENT ILLNESS:   41 year old female with past medical history as below, which is significant for insulin-dependent diabetes, hypothyroidism, congenital absence of one kidney, and hypertension.  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  She was hospitalized for further evaluation and she did receive 1 dose of Rasburicase on November 12.  Hospital course since complicated by tumor lysis syndrome in the constellation of worsening renal function, hyperkalemia, increased uric acid level, and worsening LFTs.  11/14 she became encephalopathic after receiving pain medication and transfer to the ICU was requested.  PAST MEDICAL HISTORY :  She  has a past medical history of Cancer ([place]), Congenital absence of one kidney, Diabetes mellitus, Hypertension, and Thyroid disease.  PAST SURGICAL HISTORY: She  has a past surgical history that includes Cesarean section (2004); IR FLUORO GUIDE PORT INSERTION RIGHT (06/21/2017); and IR US Guide Vasc Access Right (06/21/2017).  Allergies  Allergen Reactions  . Oxycodone Other (See Comments)    Pt stated she hallucinated from the oxycodone    No current facility-administered medications on file prior to encounter.    Current Outpatient Medications on File Prior to Encounter  Medication Sig  . acetaminophen (TYLENOL) 325 MG tablet Take 2 tablets (650 mg total) by mouth every 6 (six) hours as needed (for pain, fever above 101 and Headache.).  Marland Kitchen atenolol (TENORMIN) 50 MG tablet Take 50 mg by mouth daily.  . clonazePAM (KLONOPIN) 0.5 MG tablet Take 0.5 mg by mouth 3 (three) times daily as needed for anxiety.   . docusate sodium (COLACE) 100 MG capsule Take 1 capsule (100 mg total) 2 (two)  times daily by mouth.  Marland Kitchen HYDROcodone-acetaminophen (NORCO/VICODIN) 5-325 MG tablet Take 1-2 tablets every 6 (six) hours as needed by mouth for severe pain.  Marland Kitchen insulin detemir (LEVEMIR) 100 unit/ml SOLN Inject 0.3 mLs (30 Units total) into the skin at bedtime. (Patient taking differently: Inject 30 Units into the skin at bedtime as needed (low sugar). )  . Insulin Pen Needle 31G X 5 MM MISC Use as needed to inject insulin daily as instructed  . levothyroxine (SYNTHROID, LEVOTHROID) 200 MCG tablet Take 200 mcg daily before breakfast by mouth.   . losartan (COZAAR) 25 MG tablet Take 1 tablet (25 mg total) daily by mouth.  . metoCLOPramide (REGLAN) 10 MG tablet Take 1 tablet (10 mg total) every 6 (six) hours as needed for up to 7 days by mouth for nausea.  . ondansetron (ZOFRAN) 4 MG tablet Take 1 tablet (4 mg total) by mouth every 6 (six) hours as needed for nausea or vomiting.  . sertraline (ZOLOFT) 100 MG tablet Take 100 mg daily by mouth.   . sitaGLIPtin (JANUVIA) 100 MG tablet Take 100 mg by mouth daily.    FAMILY HISTORY:  Her indicated that her mother is alive. She indicated that her father is alive.   SOCIAL HISTORY: She  reports that she quit smoking about 4 years ago. she has never used smokeless tobacco. She reports that she drinks alcohol. She reports that she does not use drugs.  REVIEW OF SYSTEMS:   Bolds are positive  Constitutional: weight loss, gain, night sweats, Fevers, chills, fatigue .  HEENT: headaches, Sore throat, sneezing, nasal congestion, post nasal  drip, Difficulty swallowing, Tooth/dental problems, visual complaints visual changes, ear ache CV:  chest pain, radiates:,Orthopnea, PND, swelling in lower extremities, dizziness, palpitations, syncope.  GI  heartburn, indigestion, abdominal pain RUQ, nausea, vomiting, diarrhea, change in bowel habits, loss of appetite, bloody stools.  Resp: cough, productive: hemoptysis, dyspnea, chest pain, pleuritic.  Skin: rash or  itching or icterus GU: dysuria, change in color of urine, urgency or frequency. flank pain, hematuria  MS: joint pain or swelling. decreased range of motion  Psych: change in mood or affect. depression or anxiety.  Neuro: difficulty with speech, weakness, numbness, ataxia    SUBJECTIVE:  "Feeling good right now"  VITAL SIGNS: BP (!) 86/54 (BP Location: Right Arm)   Pulse 82   Temp 98.3 F (36.8 C) (Oral)   Resp 11   Ht 5' 6"  (1.676 m)   Wt 113.9 kg (251 lb 1.7 oz)   LMP 06/07/2017   SpO2 96%   BMI 40.53 kg/m   HEMODYNAMICS:    VENTILATOR SETTINGS:    INTAKE / OUTPUT: I/O last 3 completed shifts: In: 240 [P.O.:240] Out: 1250 [Urine:1250]  PHYSICAL EXAMINATION: General:  Obese female in NAD Neuro:  Somewhat groggy, but spontaneously awake, alert, oriented.  HEENT:  [place]/AT, PERRL, no JVD Cardiovascular:  RRR, no MRG Lungs:  Clear, unlabored Abdomen:  Soft, unusual shape due to remote kidney surgery.  Musculoskeletal:  No acute deformity or ROM limitation.  Skin:  Grossly intact  LABS:  BMET Recent Labs  Lab 06/21/17 0359 06/22/17 0459 06/22/17 0810  NA 134* 133* 135  K 3.8 6.3* 6.0*  CL 99* 99* 100*  CO2 24 20* 21*  BUN 17 30* 33*  CREATININE 0.85 2.06* 2.18*  GLUCOSE 52* 107* 93    Electrolytes Recent Labs  Lab 06/21/17 0359 06/22/17 0459 06/22/17 0810  CALCIUM 9.0 8.8* 8.5*    CBC Recent Labs  Lab 06/19/2017 1233 06/21/17 0359 06/22/17 0810  WBC 11.6* 10.1 10.7*  HGB 14.1 12.7 13.2  HCT 42.5 40.5 41.6  PLT PLATELET CLUMPS NOTED ON SMEAR, COUNT APPEARS ADEQUATE 168 196    Coag's Recent Labs  Lab 06/21/17 0359  INR 1.11    Sepsis Markers No results for input(s): LATICACIDVEN, PROCALCITON, O2SATVEN in the last 168 hours.  ABG No results for input(s): PHART, PCO2ART, PO2ART in the last 168 hours.  Liver Enzymes Recent Labs  Lab 06/21/17 0359 06/22/17 0459 06/22/17 0810  AST 107* 1,252* 2,245*  ALT 52 299* 446*  ALKPHOS  295* 358* 409*  BILITOT 6.2* 5.7* 6.0*  ALBUMIN 2.2* 2.5* 2.5*    Cardiac Enzymes No results for input(s): TROPONINI, PROBNP in the last 168 hours.  Glucose Recent Labs  Lab 06/21/17 0800 06/21/17 1113 06/21/17 1824 06/21/17 2038 06/22/17 0633 06/22/17 0740  GLUCAP 39* 85 137* 127* 90 99    Imaging Ir US Guide Vasc Access Right  Result Date: 06/21/2017 INDICATION: History of lymphoma. In need of durable intravenous access for chemotherapy administration. EXAM: IMPLANTED PORT A CATH PLACEMENT WITH ULTRASOUND AND FLUOROSCOPIC GUIDANCE COMPARISON:  Chest CT - 06/08/2017 MEDICATIONS: Ancef 2 gm IV; The antibiotic was administered within an appropriate time interval prior to skin puncture. ANESTHESIA/SEDATION: Moderate (conscious) sedation was employed during this procedure. A total of Versed 1 mg was administered intravenously. Moderate Sedation Time: 23 minutes. The patient's level of consciousness and vital signs were monitored continuously by radiology nursing throughout the procedure under my direct supervision. CONTRAST:  None FLUOROSCOPY TIME:  18 seconds (8 mGy) COMPLICATIONS:  None immediate. PROCEDURE: The procedure, risks, benefits, and alternatives were explained to the patient. Questions regarding the procedure were encouraged and answered. The patient understands and consents to the procedure. The right neck and chest were prepped with chlorhexidine in a sterile fashion, and a sterile drape was applied covering the operative field. Maximum barrier sterile technique with sterile gowns and gloves were used for the procedure. A timeout was performed prior to the initiation of the procedure. Local anesthesia was provided with 1% lidocaine with epinephrine. After creating a small venotomy incision, a micropuncture kit was utilized to access the internal jugular vein. Real-time ultrasound guidance was utilized for vascular access including the acquisition of a permanent ultrasound image  documenting patency of the accessed vessel. The microwire was utilized to measure appropriate catheter length. A subcutaneous port pocket was then created along the upper chest wall utilizing a combination of sharp and blunt dissection. The pocket was irrigated with sterile saline. A single lumen ISP power injectable port was chosen for placement. The 8 Fr catheter was tunneled from the port pocket site to the venotomy incision. The port was placed in the pocket. The external catheter was trimmed to appropriate length. At the venotomy, an 8 Fr peel-away sheath was placed over a guidewire under fluoroscopic guidance. The catheter was then placed through the sheath and the sheath was removed. Final catheter positioning was confirmed and documented with a fluoroscopic spot radiograph. The port was accessed with a Huber needle, aspirated and flushed. The venotomy site was closed with an interrupted 4-0 Vicryl suture. The port pocket incision was closed with interrupted 2-0 Vicryl suture and the skin was opposed with a running subcuticular 4-0 Vicryl suture. Dermabond and Steri-strips were applied to both incisions. The port was left accessed as per recommendations from the providing clinical team. Dressings were placed. The patient tolerated the procedure well without immediate post procedural complication. FINDINGS: After catheter placement, the tip lies within the superior cavoatrial junction. The catheter aspirates and flushes normally and is ready for immediate use. IMPRESSION: Successful placement of a right internal jugular approach power injectable Port-A-Cath. The Port a Catheter was left accessed for immediate use. Electronically Signed   By: Sandi Mariscal M.D.   On: 06/21/2017 10:03   Ir Fluoro Guide Port Insertion Right  Result Date: 06/21/2017 INDICATION: History of lymphoma. In need of durable intravenous access for chemotherapy administration. EXAM: IMPLANTED PORT A CATH PLACEMENT WITH ULTRASOUND AND  FLUOROSCOPIC GUIDANCE COMPARISON:  Chest CT - 06/08/2017 MEDICATIONS: Ancef 2 gm IV; The antibiotic was administered within an appropriate time interval prior to skin puncture. ANESTHESIA/SEDATION: Moderate (conscious) sedation was employed during this procedure. A total of Versed 1 mg was administered intravenously. Moderate Sedation Time: 23 minutes. The patient's level of consciousness and vital signs were monitored continuously by radiology nursing throughout the procedure under my direct supervision. CONTRAST:  None FLUOROSCOPY TIME:  18 seconds (8 mGy) COMPLICATIONS: None immediate. PROCEDURE: The procedure, risks, benefits, and alternatives were explained to the patient. Questions regarding the procedure were encouraged and answered. The patient understands and consents to the procedure. The right neck and chest were prepped with chlorhexidine in a sterile fashion, and a sterile drape was applied covering the operative field. Maximum barrier sterile technique with sterile gowns and gloves were used for the procedure. A timeout was performed prior to the initiation of the procedure. Local anesthesia was provided with 1% lidocaine with epinephrine. After creating a small venotomy incision, a micropuncture kit was utilized to  access the internal jugular vein. Real-time ultrasound guidance was utilized for vascular access including the acquisition of a permanent ultrasound image documenting patency of the accessed vessel. The microwire was utilized to measure appropriate catheter length. A subcutaneous port pocket was then created along the upper chest wall utilizing a combination of sharp and blunt dissection. The pocket was irrigated with sterile saline. A single lumen ISP power injectable port was chosen for placement. The 8 Fr catheter was tunneled from the port pocket site to the venotomy incision. The port was placed in the pocket. The external catheter was trimmed to appropriate length. At the venotomy, an  8 Fr peel-away sheath was placed over a guidewire under fluoroscopic guidance. The catheter was then placed through the sheath and the sheath was removed. Final catheter positioning was confirmed and documented with a fluoroscopic spot radiograph. The port was accessed with a Huber needle, aspirated and flushed. The venotomy site was closed with an interrupted 4-0 Vicryl suture. The port pocket incision was closed with interrupted 2-0 Vicryl suture and the skin was opposed with a running subcuticular 4-0 Vicryl suture. Dermabond and Steri-strips were applied to both incisions. The port was left accessed as per recommendations from the providing clinical team. Dressings were placed. The patient tolerated the procedure well without immediate post procedural complication. FINDINGS: After catheter placement, the tip lies within the superior cavoatrial junction. The catheter aspirates and flushes normally and is ready for immediate use. IMPRESSION: Successful placement of a right internal jugular approach power injectable Port-A-Cath. The Port a Catheter was left accessed for immediate use. Electronically Signed   By: Sandi Mariscal M.D.   On: 06/21/2017 10:03     STUDIES:  11/5 Liver biopsy >>> 11/13 Bone marrow biopsy >>>  CULTURES:   ANTIBIOTICS:   SIGNIFICANT EVENTS: 11/12 last chemo 11/13 port placed 11/14 tumor lysis syndrome suspected 11/15 encephalopathic, transfer to ICU  LINES/TUBES: Port 11/13 >  DISCUSSION: 41 year old female with recent diagnosis of B-cell lymphoma admitted for further workup on the 12th.  Course has been complicated by tumor lysis syndrome and associated hepatic and renal insufficiency.  She has been given Dilaudid on the morning of 11/14 for pain and became lethargic.  She was transferred to the ICU in the setting.  ASSESSMENT / PLAN:  PULMONARY A: Hypoxia - happened last night with pain meds and again this AM with pain med.   P:   Supplemental O2 PRN to keep  sats > 90% Will need to decrease opioids CXR  CARDIOVASCULAR A:  Hypotension - likely opioid related.  History HTN  P:  ICU monitoring Gentle volume No pressors Monitor for now Holding home atenolol, losartan  RENAL A:   Acute kidney injury -  likely secondary to TLS Hyperkalemia  P:   Nephrology to see Await their recs 30 g Kayexalate given 0700 Hope can ride this out, may need HD for hyperK if cannot manage medically.  Urine sodium, urine creatinine  GASTROINTESTINAL A:   Transaminitis - likely secondary to TLS  P:   Hydrate Trend LFT NPO except sips w meds for now GI to see  HEMATOLOGIC/Oncologic A:   Diffuse large B-cell lymphoma. Recent Dx. Staging incomplete Tumor lysis syndrome  P:  Oncology following Awaiting bone marrow Dexamethasone Need to hold chemo until improved.   INFECTIOUS A:   No acute issues  P:   Follow  ENDOCRINE A:   IDDM Hypothyroid   P:   CBG monitoring and SSI Continue synthroid  NEUROLOGIC A:   Acute metabolic vs toxic encephalopathy  P:   RASS goal: 0 DC dilaudid. PRN morphine lower dose for pain management.    FAMILY  - Updates: Patient and mother update din ICU 11/14  - Inter-disciplinary family meet or Palliative Care meeting due by:  11/19   Georgann Housekeeper, AGACNP-BC [facility]/Critical Care Pager [phone] or [phone]  06/22/2017 10:17 AM  Attending Note:  41 year old female with recent diagnosis of lymphoma who developed tumor lysis syndrome after chemo now with metabolic acidosis and renal failure.  Patient was noted to be hypotensive this AM and with AMS and was transferred to the ICU by [facility].  Upon transfer, patient is easily arousable but is hypotensive to the 80.  UOP is poor.  Patient is receiving IVF resuscitation.  On exam, lungs are clear.  Will continue IVF resuscitation, limit narcotics as able.  Renal service called.  Mixed metabolic/respiratory acidosis noted.  Will  change IVF resuscitation to NaHCO3.  Limit narcotics for the respiratory component.  No need for f/u ABG.  Titrate O2 for sat of 88-92%.  CXR ordered.  The patient is critically ill with multiple organ systems failure and requires high complexity decision making for assessment and support, frequent evaluation and titration of therapies, application of advanced monitoring technologies and extensive interpretation of multiple databases.   Critical Care Time devoted to patient care services described in this note is  35  Minutes. This time reflects time of care of this signee Dr Jennet Maduro. This critical care time does not reflect procedure time, or teaching time or supervisory time of PA/NP/Med student/Med Resident etc but could involve care discussion time.  Rush Farmer, M.D. [facility] Pulmonary/Critical Care Medicine. Pager: [phone]. After hours pager: [phone].

## 2017-06-22 NOTE — Progress Notes (Signed)
resp 8 / min.  sats fluctuating between 88% and 96%.  Arouses easily but remains drowsy.  02 placed at 2L. Marland KitchenWhen asked states her pain level is a "10" but falls right back to sleep.  No pain med given at this time.

## 2017-06-22 NOTE — Progress Notes (Signed)
Patricia Mcclain   DOB:[birth year]   VV#:[id number]    Subjective: She underwent port placement and bone marrow biopsy yesterday. Overnight nurse noted shallow RR, oxygen was placed for comfort. She denies shortness of breath. Her pain is stable on current dose Dilaudid.  Objective:  Vitals:   06/21/17 2300 06/22/17 0430  BP:  104/69  Pulse: 67 82  Resp:  (!) 8  Temp:  98.8 F (37.1 C)  SpO2: 98% 97%     Intake/Output Summary (Last 24 hours) at 06/22/2017 6269 Last data filed at 06/21/2017 2313 Gross per 24 hour  Intake 240 ml  Output 850 ml  Net -610 ml    GENERAL:alert, somewhat sleepy, no distress and comfortable SKIN: skin color, texture, turgor are normal, no rashes or significant lesions EYES: normal, Conjunctiva are jaundiced OROPHARYNX:no exudate, no erythema and lips, buccal mucosa, and tongue normal  NECK: supple, thyroid normal size, non-tender, without nodularity LYMPH:  no palpable lymphadenopathy in the cervical, axillary or inguinal LUNGS: clear to auscultation and percussion with normal breathing effort HEART: regular rate & rhythm and no murmurs and no lower extremity edema ABDOMEN:abdomen soft, palpable liver enlargement, mild tenderness and normal bowel sounds Musculoskeletal:no cyanosis of digits and no clubbing  NEURO: alert & oriented x 3 with fluent speech, no focal motor/sensory deficits   Labs:  Lab Results  Component Value Date   WBC 10.1 06/21/2017   HGB 12.7 06/21/2017   HCT 40.5 06/21/2017   MCV 79.3 06/21/2017   PLT 168 06/21/2017   NEUTROABS 7.9 (H) 06/21/2017    Lab Results  Component Value Date   NA 133 (L) 06/22/2017   K 6.3 (HH) 06/22/2017   CL 99 (L) 06/22/2017   CO2 20 (L) 06/22/2017    Studies:  Ir US Guide Vasc Access Right  Result Date: 06/21/2017 INDICATION: History of lymphoma. In need of durable intravenous access for chemotherapy administration. EXAM: IMPLANTED PORT A CATH PLACEMENT WITH ULTRASOUND AND FLUOROSCOPIC  GUIDANCE COMPARISON:  Chest CT - 06/08/2017 MEDICATIONS: Ancef 2 gm IV; The antibiotic was administered within an appropriate time interval prior to skin puncture. ANESTHESIA/SEDATION: Moderate (conscious) sedation was employed during this procedure. A total of Versed 1 mg was administered intravenously. Moderate Sedation Time: 23 minutes. The patient's level of consciousness and vital signs were monitored continuously by radiology nursing throughout the procedure under my direct supervision. CONTRAST:  None FLUOROSCOPY TIME:  18 seconds (8 mGy) COMPLICATIONS: None immediate. PROCEDURE: The procedure, risks, benefits, and alternatives were explained to the patient. Questions regarding the procedure were encouraged and answered. The patient understands and consents to the procedure. The right neck and chest were prepped with chlorhexidine in a sterile fashion, and a sterile drape was applied covering the operative field. Maximum barrier sterile technique with sterile gowns and gloves were used for the procedure. A timeout was performed prior to the initiation of the procedure. Local anesthesia was provided with 1% lidocaine with epinephrine. After creating a small venotomy incision, a micropuncture kit was utilized to access the internal jugular vein. Real-time ultrasound guidance was utilized for vascular access including the acquisition of a permanent ultrasound image documenting patency of the accessed vessel. The microwire was utilized to measure appropriate catheter length. A subcutaneous port pocket was then created along the upper chest wall utilizing a combination of sharp and blunt dissection. The pocket was irrigated with sterile saline. A single lumen ISP power injectable port was chosen for placement. The 8 Fr catheter was tunneled from the  port pocket site to the venotomy incision. The port was placed in the pocket. The external catheter was trimmed to appropriate length. At the venotomy, an 8 Fr  peel-away sheath was placed over a guidewire under fluoroscopic guidance. The catheter was then placed through the sheath and the sheath was removed. Final catheter positioning was confirmed and documented with a fluoroscopic spot radiograph. The port was accessed with a Huber needle, aspirated and flushed. The venotomy site was closed with an interrupted 4-0 Vicryl suture. The port pocket incision was closed with interrupted 2-0 Vicryl suture and the skin was opposed with a running subcuticular 4-0 Vicryl suture. Dermabond and Steri-strips were applied to both incisions. The port was left accessed as per recommendations from the providing clinical team. Dressings were placed. The patient tolerated the procedure well without immediate post procedural complication. FINDINGS: After catheter placement, the tip lies within the superior cavoatrial junction. The catheter aspirates and flushes normally and is ready for immediate use. IMPRESSION: Successful placement of a right internal jugular approach power injectable Port-A-Cath. The Port a Catheter was left accessed for immediate use. Electronically Signed   By: Sandi Mariscal M.D.   On: 06/21/2017 10:03   Ct Biopsy  Result Date: 06/21/2017 INDICATION: History of lymphoma. Please perform CT-guided bone marrow biopsy for tissue diagnostic purposes. EXAM: CT-GUIDED BONE MARROW BIOPSY AND ASPIRATION MEDICATIONS: None ANESTHESIA/SEDATION: Fentanyl 100 mcg IV; Versed 2 mg IV Sedation Time: 10 minutes; The patient was continuously monitored during the procedure by the interventional radiology nurse under my direct supervision. COMPLICATIONS: None immediate. PROCEDURE: Informed consent was obtained from the patient following an explanation of the procedure, risks, benefits and alternatives. The patient understands, agrees and consents for the procedure. All questions were addressed. A time out was performed prior to the initiation of the procedure. The patient was positioned  prone and non-contrast localization CT was performed of the pelvis to demonstrate the iliac marrow spaces. The operative site was prepped and draped in the usual sterile fashion. Under sterile conditions and local anesthesia, a 22 gauge spinal needle was utilized for procedural planning. Next, an 11 gauge coaxial bone biopsy needle was advanced into the left iliac marrow space. Needle position was confirmed with CT imaging. Initially, bone marrow aspiration was performed. Next, a bone marrow biopsy was obtained with the 11 gauge outer bone marrow device. Samples were prepared with the cytotechnologist and deemed adequate. The needle was removed intact. Hemostasis was obtained with compression and a dressing was placed. The patient tolerated the procedure well without immediate post procedural complication. IMPRESSION: Successful CT guided left iliac bone marrow aspiration and core biopsy. Electronically Signed   By: Sandi Mariscal M.D.   On: 06/21/2017 10:22   Ct Bone Marrow Biopsy & Aspiration  Result Date: 06/21/2017 INDICATION: History of lymphoma. Please perform CT-guided bone marrow biopsy for tissue diagnostic purposes. EXAM: CT-GUIDED BONE MARROW BIOPSY AND ASPIRATION MEDICATIONS: None ANESTHESIA/SEDATION: Fentanyl 100 mcg IV; Versed 2 mg IV Sedation Time: 10 minutes; The patient was continuously monitored during the procedure by the interventional radiology nurse under my direct supervision. COMPLICATIONS: None immediate. PROCEDURE: Informed consent was obtained from the patient following an explanation of the procedure, risks, benefits and alternatives. The patient understands, agrees and consents for the procedure. All questions were addressed. A time out was performed prior to the initiation of the procedure. The patient was positioned prone and non-contrast localization CT was performed of the pelvis to demonstrate the iliac marrow spaces. The operative site was  prepped and draped in the usual sterile  fashion. Under sterile conditions and local anesthesia, a 22 gauge spinal needle was utilized for procedural planning. Next, an 11 gauge coaxial bone biopsy needle was advanced into the left iliac marrow space. Needle position was confirmed with CT imaging. Initially, bone marrow aspiration was performed. Next, a bone marrow biopsy was obtained with the 11 gauge outer bone marrow device. Samples were prepared with the cytotechnologist and deemed adequate. The needle was removed intact. Hemostasis was obtained with compression and a dressing was placed. The patient tolerated the procedure well without immediate post procedural complication. IMPRESSION: Successful CT guided left iliac bone marrow aspiration and core biopsy. Electronically Signed   By: Sandi Mariscal M.D.   On: 06/21/2017 10:22   Ir Fluoro Guide Port Insertion Right  Result Date: 06/21/2017 INDICATION: History of lymphoma. In need of durable intravenous access for chemotherapy administration. EXAM: IMPLANTED PORT A CATH PLACEMENT WITH ULTRASOUND AND FLUOROSCOPIC GUIDANCE COMPARISON:  Chest CT - 06/08/2017 MEDICATIONS: Ancef 2 gm IV; The antibiotic was administered within an appropriate time interval prior to skin puncture. ANESTHESIA/SEDATION: Moderate (conscious) sedation was employed during this procedure. A total of Versed 1 mg was administered intravenously. Moderate Sedation Time: 23 minutes. The patient's level of consciousness and vital signs were monitored continuously by radiology nursing throughout the procedure under my direct supervision. CONTRAST:  None FLUOROSCOPY TIME:  18 seconds (8 mGy) COMPLICATIONS: None immediate. PROCEDURE: The procedure, risks, benefits, and alternatives were explained to the patient. Questions regarding the procedure were encouraged and answered. The patient understands and consents to the procedure. The right neck and chest were prepped with chlorhexidine in a sterile fashion, and a sterile drape was applied  covering the operative field. Maximum barrier sterile technique with sterile gowns and gloves were used for the procedure. A timeout was performed prior to the initiation of the procedure. Local anesthesia was provided with 1% lidocaine with epinephrine. After creating a small venotomy incision, a micropuncture kit was utilized to access the internal jugular vein. Real-time ultrasound guidance was utilized for vascular access including the acquisition of a permanent ultrasound image documenting patency of the accessed vessel. The microwire was utilized to measure appropriate catheter length. A subcutaneous port pocket was then created along the upper chest wall utilizing a combination of sharp and blunt dissection. The pocket was irrigated with sterile saline. A single lumen ISP power injectable port was chosen for placement. The 8 Fr catheter was tunneled from the port pocket site to the venotomy incision. The port was placed in the pocket. The external catheter was trimmed to appropriate length. At the venotomy, an 8 Fr peel-away sheath was placed over a guidewire under fluoroscopic guidance. The catheter was then placed through the sheath and the sheath was removed. Final catheter positioning was confirmed and documented with a fluoroscopic spot radiograph. The port was accessed with a Huber needle, aspirated and flushed. The venotomy site was closed with an interrupted 4-0 Vicryl suture. The port pocket incision was closed with interrupted 2-0 Vicryl suture and the skin was opposed with a running subcuticular 4-0 Vicryl suture. Dermabond and Steri-strips were applied to both incisions. The port was left accessed as per recommendations from the providing clinical team. Dressings were placed. The patient tolerated the procedure well without immediate post procedural complication. FINDINGS: After catheter placement, the tip lies within the superior cavoatrial junction. The catheter aspirates and flushes normally  and is ready for immediate use. IMPRESSION: Successful placement of a  right internal jugular approach power injectable Port-A-Cath. The Port a Catheter was left accessed for immediate use. Electronically Signed   By: Sandi Mariscal M.D.   On: 06/21/2017 10:03    Assessment & Plan:   Newly diagnosed diffuse large B-cell lymphoma Given severity of liver involvement, I recommend inpatient hospitalization to expedite workup and to start her on treatmentASAP I have requested pathologist to add FISH analysis to rule out double hit lymphoma Due to the severity of disease, it is not possible to order outpatient PET CT scan for staging I reviewedher CT scan of the chest, abdomen and pelvis from October She had mildly enlarged intra-abdominal lymph node, diffuse liver enlargement and possible abnormalities in her lungs Staging is incomplete Bone marrow biopsy is pending, performed on 06/21/17 Port placement completed on 06/21/17 She has tumor lysis syndrome after receiving 2 daily doses of dexamethasone on 11/12 and 11/13 No plan to start chemo now, pharmacist is informed  Anorexia Is likely secondary to high risk lymphoma She is on nutritional supplement  Profound weakness She would benefit from physical therapy and rehab while hospitalized  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 IV dexamethasone has been discontinued Will consult GI to follow  Solitary kidney She is at high risk of tumor lysis syndrome Will consult nephrologist to follow  Hyperuricemia, hyperkalemia, acute tumor lysis syndrome I have prescribed bolus IV normal saline 1 liter over 2 hours to be followed by IV lasix 40 mg IV I have prescribed 1 dose kayexalate 30 g  PO X 1 Will increase basal NS to 125 cc/hour Will consult nephrologist Will repeat 1 dose IV Elitek at 6 mg. She has received 1 dose of rasburicase on June 20, 2017 Will check uric acid daily Will get repeat CMP at 11 am today Will get baseline EKG  Will consult hospitalist to take over care She may need to be transferred to telemetry monitoring Once her uric level is reasonable, I will start her on high-dose allopurinol  Insulin dependent diabetes Due to poor oral intake, her blood sugar is borderline low I have discontinued long-acting insulin I will start her on insulin sliding scale and will check her blood sugar 3 times a day and adjust as needed  DVT prophylaxis High risk of bleeding with progressive liver failure I will check coagulation studies with repeat labs at 11 am  CODE STATUS Full code  Discharge planning She is critically ill No plans for discharge for now  Heath Lark, MD 06/22/2017  7:28 AM

## 2017-06-22 NOTE — Progress Notes (Signed)
Telemetry unit requesting pt be transferred to higher level of care/ICU bed, feels pt is not appropriate for their floor. BP 85/62 after 1000cc bolus NS. Dr. [place] Pink paged.

## 2017-06-22 NOTE — Progress Notes (Addendum)
CRITICAL VALUE ALERT  Critical Value:  Lactic Acid 4.6  Date & Time Notied:  @1623  on 06/22/2017  Provider Notified: MD Nelda Marseille  Orders Received/Actions taken:  Notified MD Nelda Marseille @1628  in regards to lactic acid level above and also patient's ongoing hypotension. BP systolic 27X-41O upon automatic cuff reading and agrees with manual cuff reading. Patient is alert and oriented x 4. MD Nelda Marseille is okay with BP due to current state of mentation. Patient also has received 3 Liters of fluid boluses total today. Will continue to monitor mentation and will call MD if patient becomes altered or BP becomes much worse.

## 2017-06-22 NOTE — Progress Notes (Signed)
Dr. Alvy Bimler notified of pt's critical potassium level of 6.3

## 2017-06-22 NOTE — Consult Note (Signed)
Referring Provider:  Dr. Alvy Bimler Primary Care Physician:  Levin Erp, MD Primary Gastroenterologist:  Althia Forts  Reason for Consultation:  Elevated LFT's  HPI: Patricia Mcclain is a 41 y.o. 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  She was found to have liver involvement with innumerable lesions.  She was hospitalized by oncology to undergo further evaluation and to initiate treatment.  She was given steroids.  Subsequently she developed tumor lysis syndrome.  She was given 1 dose of rasburicase on November 12.  Over the last 24 hours patient has become more fatigued.  Her urine output has dropped off.  This morning she was noted to have worsening renal function worsening uric acid level and worsening LFTs.  She was noted to be hyperkalemic.  Was hypotensive.  Nephrology and PCCM have been consulted and shew as transferred to step-down.  GI was consulted by oncology in regards to the elevated LFT's.    AST at 2245 today from 1252 just 3 hours earlier and was only 107 yesterday, 11/13. ALT at 446 today from 299 earlier today and was only 52 yesterday, 11/13. ALP at 409 today, which is only up slight from 358 earlier today and 295 yesterday, 11/13. Total bili is 6.0 today.  Was 5.7 earlier today and was actually 8.3 2 days ago.  PT/INR is up slightly at 1.33.   Past Medical History:  Diagnosis Date  . Cancer ([place])   . Congenital absence of one kidney   . Diabetes mellitus   . Hypertension   . Thyroid disease     Past Surgical History:  Procedure Laterality Date  . CESAREAN SECTION  2004  . IR FLUORO GUIDE PORT INSERTION RIGHT  06/21/2017  . IR US GUIDE VASC ACCESS RIGHT  06/21/2017    Prior to Admission medications   Medication Sig Start Date End Date Taking? Authorizing Provider  acetaminophen (TYLENOL) 325 MG tablet Take 2  tablets (650 mg total) by mouth every 6 (six) hours as needed (for pain, fever above 101 and Headache.). 05/15/17  Yes Barton Dubois, MD  atenolol (TENORMIN) 50 MG tablet Take 50 mg by mouth daily.   Yes [provider]  clonazePAM (KLONOPIN) 0.5 MG tablet Take 0.5 mg by mouth 3 (three) times daily as needed for anxiety.    Yes [provider]  docusate sodium (COLACE) 100 MG capsule Take 1 capsule (100 mg total) 2 (two) times daily by mouth. 06/14/17  Yes Barton Dubois, MD  HYDROcodone-acetaminophen (NORCO/VICODIN) 5-325 MG tablet Take 1-2 tablets every 6 (six) hours as needed by mouth for severe pain. 06/14/17  Yes Barton Dubois, MD  insulin detemir (LEVEMIR) 100 unit/ml SOLN Inject 0.3 mLs (30 Units total) into the skin at bedtime. Patient taking differently: Inject 30 Units into the skin at bedtime as needed (low sugar).  05/15/17  Yes Barton Dubois, MD  Insulin Pen Needle 31G X 5 MM MISC Use as needed to inject insulin daily as instructed 05/15/17  Yes Barton Dubois, MD  levothyroxine (SYNTHROID, LEVOTHROID) 200 MCG tablet Take 200 mcg daily before breakfast by mouth.    Yes [provider]  losartan (COZAAR) 25 MG tablet Take 1 tablet (25 mg total) daily by mouth. 06/14/17  Yes Barton Dubois, MD  metoCLOPramide (REGLAN) 10 MG tablet Take 1 tablet (10 mg total) every 6 (six) hours as needed for up to 7 days by mouth  for nausea. 06/16/17 06/23/17 Yes Gorsuch, Ni, MD  ondansetron (ZOFRAN) 4 MG tablet Take 1 tablet (4 mg total) by mouth every 6 (six) hours as needed for nausea or vomiting. 46/5/68  Yes Delora Fuel, MD  sertraline (ZOLOFT) 100 MG tablet Take 100 mg daily by mouth.    Yes [provider]  sitaGLIPtin (JANUVIA) 100 MG tablet Take 100 mg by mouth daily.   Yes [provider]    Current Facility-Administered Medications  Medication Dose Route Frequency Provider Last Rate Last Dose  . acetaminophen (TYLENOL) tablet 650 mg  650 mg Oral Q6H  PRN Alvy Bimler, Ni, MD      . acyclovir (ZOVIRAX) tablet 400 mg  400 mg Oral Daily Alvy Bimler, Ni, MD   400 mg at 06/21/17 1042  . alum & mag hydroxide-simeth (MAALOX/MYLANTA) 200-200-20 MG/5ML suspension 60 mL  60 mL Oral Q4H PRN Gorsuch, Ni, MD      . diphenhydrAMINE (BENADRYL) capsule 25 mg  25 mg Oral Q6H PRN Ladell Pier, MD   25 mg at 06/21/17 1646  . docusate sodium (COLACE) capsule 100 mg  100 mg Oral BID Alvy Bimler, Ni, MD   100 mg at 06/21/17 2311  . guaiFENesin-dextromethorphan (ROBITUSSIN DM) 100-10 MG/5ML syrup 10 mL  10 mL Oral Q4H PRN Alvy Bimler, Ni, MD      . hydrocortisone (ANUSOL-HC) 2.5 % rectal cream 1 application  1 application Rectal BID PRN Gorsuch, Ni, MD      . insulin aspart (novoLOG) injection 0-24 Units  0-24 Units Subcutaneous TID WC Heath Lark, MD   2 Units at 06/21/17 1832  . lactose free nutrition (BOOST PLUS) liquid 237 mL  237 mL Oral TID WC Alvy Bimler, Ni, MD   237 mL at 06/22/17 0806  . levothyroxine (SYNTHROID, LEVOTHROID) tablet 200 mcg  200 mcg Oral QAC breakfast Heath Lark, MD   200 mcg at 06/22/17 0806  . lidocaine-prilocaine (EMLA) cream   Topical PRN Alvy Bimler, Ni, MD      . morphine 4 MG/ML injection 1-2 mg  1-2 mg Intravenous Q3H PRN Corey Harold, NP      . multivitamin with minerals tablet 1 tablet  1 tablet Oral Daily Heath Lark, MD   1 tablet at 06/21/17 1309  . ondansetron (ZOFRAN) tablet 4-8 mg  4-8 mg Oral Q8H PRN Alvy Bimler, Ni, MD       Or  . ondansetron (ZOFRAN-ODT) disintegrating tablet 4-8 mg  4-8 mg Oral Q8H PRN Alvy Bimler, Ni, MD       Or  . ondansetron (ZOFRAN) injection 4 mg  4 mg Intravenous Q8H PRN Alvy Bimler, Ni, MD   4 mg at 06/17/2017 1432   Or  . ondansetron (ZOFRAN) 8 mg in sodium chloride 0.9 % 50 mL IVPB  8 mg Intravenous Q8H PRN Gorsuch, Ni, MD      . senna-docusate (Senokot-S) tablet 1 tablet  1 tablet Oral QHS PRN Alvy Bimler, Ni, MD      . sertraline (ZOLOFT) tablet 200 mg  200 mg Oral Daily Alvy Bimler, Ni, MD   200 mg at 06/21/17 1042  . sodium  bicarbonate 150 mEq in dextrose 5 % 1,000 mL infusion   Intravenous Continuous Roney Jaffe, MD      . sodium chloride 0.9 % bolus 2,000 mL  2,000 mL Intravenous Once Roney Jaffe, MD      . sodium polystyrene (KAYEXALATE) 15 GM/60ML suspension 30 g  30 g Oral TID Roney Jaffe, MD  Allergies as of 06/23/2017 - Review Complete 07/04/2017  Allergen Reaction Noted  . Oxycodone Other (See Comments) 06/14/2017    Family History  Problem Relation Age of Onset  . Peripheral vascular disease Mother   . Diabetes Father     Social History   Socioeconomic History  . Marital status: Single    Spouse name: Not on file  . Number of children: Not on file  . Years of education: Not on file  . Highest education level: Not on file  Social Needs  . Financial resource strain: Not on file  . Food insecurity - worry: Not on file  . Food insecurity - inability: Not on file  . Transportation needs - medical: Not on file  . Transportation needs - non-medical: Not on file  Occupational History  . Not on file  Tobacco Use  . Smoking status: Former Smoker    Last attempt to quit: 06/06/2013    Years since quitting: 4.0  . Smokeless tobacco: Never Used  Substance and Sexual Activity  . Alcohol use: Yes  . Drug use: No  . Sexual activity: Yes    Birth control/protection: None  Other Topics Concern  . Not on file  Social History Narrative  . Not on file    Review of Systems: ROS is negative except as mentioned in HPI.  Physical Exam: Vital signs in last 24 hours: Temp:  [97.5 F (36.4 C)-98.8 F (37.1 C)] 98.3 F (36.8 C) (11/14 0900) Pulse Rate:  [67-89] 78 (11/14 1000) Resp:  [8-14] 9 (11/14 1000) BP: (81-104)/(49-76) 83/49 (11/14 1000) SpO2:  [91 %-98 %] 96 % (11/14 1000) Last BM Date: 06/19/2017 General:  Alert, Well-developed, well-nourished, pleasant and cooperative in NAD, but uncomfortable Head:  Normocephalic and atraumatic. Eyes:  Sclera clear, no icterus.   Conjunctiva pink. Ears:  Normal auditory acuity. Mouth:  No deformity or lesions.   Lungs:  Clear throughout to auscultation.  No wheezes, crackles, or rhonchi.  Heart:  Regular rate and rhythm; no murmurs, clicks, rubs, or gallops. Abdomen:  Soft, distended.  BS present.  Diffuse TTP, but moderate to severe in RUQ and epigastrium.   Msk:  Symmetrical without gross deformities. Pulses:  Normal pulses noted. Extremities:  Without clubbing or edema. Neurologic:  Slow mentation but alert and oriented x 4; had difficulty keeping her arms up but seemed to have some mild asterixis. Skin:  Intact without significant lesions or rashes. Psych:  Alert and cooperative. Normal mood and affect.  Intake/Output from previous day: 11/13 0701 - 11/14 0700 In: 240 [P.O.:240] Out: 850 [Urine:850] Intake/Output this shift: Total I/O In: -  Out: 1 [Stool:1]  Lab Results: Recent Labs    07/05/2017 1233 06/21/17 0359 06/22/17 0810  WBC 11.6* 10.1 10.7*  HGB 14.1 12.7 13.2  HCT 42.5 40.5 41.6  PLT PLATELET CLUMPS NOTED ON SMEAR, COUNT APPEARS ADEQUATE 168 196   BMET Recent Labs    06/21/17 0359 06/22/17 0459 06/22/17 0810  NA 134* 133* 135  K 3.8 6.3* 6.0*  CL 99* 99* 100*  CO2 24 20* 21*  GLUCOSE 52* 107* 93  BUN 17 30* 33*  CREATININE 0.85 2.06* 2.18*  CALCIUM 9.0 8.8* 8.5*   LFT Recent Labs    06/22/17 0810  PROT 6.5  ALBUMIN 2.5*  AST 2,245*  ALT 446*  ALKPHOS 409*  BILITOT 6.0*   PT/INR Recent Labs    06/21/17 0359  LABPROT 14.2  INR 1.11   Hepatitis Panel Recent Labs  06/27/2017 1233  HEPBSAG Negative  HEPBIGM Negative   Studies/Results: Ir US Guide Vasc Access Right  Result Date: 06/21/2017 INDICATION: History of lymphoma. In need of durable intravenous access for chemotherapy administration. EXAM: IMPLANTED PORT A CATH PLACEMENT WITH ULTRASOUND AND FLUOROSCOPIC GUIDANCE COMPARISON:  Chest CT - 06/08/2017 MEDICATIONS: Ancef 2 gm IV; The antibiotic was  administered within an appropriate time interval prior to skin puncture. ANESTHESIA/SEDATION: Moderate (conscious) sedation was employed during this procedure. A total of Versed 1 mg was administered intravenously. Moderate Sedation Time: 23 minutes. The patient's level of consciousness and vital signs were monitored continuously by radiology nursing throughout the procedure under my direct supervision. CONTRAST:  None FLUOROSCOPY TIME:  18 seconds (8 mGy) COMPLICATIONS: None immediate. PROCEDURE: The procedure, risks, benefits, and alternatives were explained to the patient. Questions regarding the procedure were encouraged and answered. The patient understands and consents to the procedure. The right neck and chest were prepped with chlorhexidine in a sterile fashion, and a sterile drape was applied covering the operative field. Maximum barrier sterile technique with sterile gowns and gloves were used for the procedure. A timeout was performed prior to the initiation of the procedure. Local anesthesia was provided with 1% lidocaine with epinephrine. After creating a small venotomy incision, a micropuncture kit was utilized to access the internal jugular vein. Real-time ultrasound guidance was utilized for vascular access including the acquisition of a permanent ultrasound image documenting patency of the accessed vessel. The microwire was utilized to measure appropriate catheter length. A subcutaneous port pocket was then created along the upper chest wall utilizing a combination of sharp and blunt dissection. The pocket was irrigated with sterile saline. A single lumen ISP power injectable port was chosen for placement. The 8 Fr catheter was tunneled from the port pocket site to the venotomy incision. The port was placed in the pocket. The external catheter was trimmed to appropriate length. At the venotomy, an 8 Fr peel-away sheath was placed over a guidewire under fluoroscopic guidance. The catheter was then  placed through the sheath and the sheath was removed. Final catheter positioning was confirmed and documented with a fluoroscopic spot radiograph. The port was accessed with a Huber needle, aspirated and flushed. The venotomy site was closed with an interrupted 4-0 Vicryl suture. The port pocket incision was closed with interrupted 2-0 Vicryl suture and the skin was opposed with a running subcuticular 4-0 Vicryl suture. Dermabond and Steri-strips were applied to both incisions. The port was left accessed as per recommendations from the providing clinical team. Dressings were placed. The patient tolerated the procedure well without immediate post procedural complication. FINDINGS: After catheter placement, the tip lies within the superior cavoatrial junction. The catheter aspirates and flushes normally and is ready for immediate use. IMPRESSION: Successful placement of a right internal jugular approach power injectable Port-A-Cath. The Port a Catheter was left accessed for immediate use. Electronically Signed   By: Sandi Mariscal M.D.   On: 06/21/2017 10:03   Ct Biopsy  Result Date: 06/21/2017 INDICATION: History of lymphoma. Please perform CT-guided bone marrow biopsy for tissue diagnostic purposes. EXAM: CT-GUIDED BONE MARROW BIOPSY AND ASPIRATION MEDICATIONS: None ANESTHESIA/SEDATION: Fentanyl 100 mcg IV; Versed 2 mg IV Sedation Time: 10 minutes; The patient was continuously monitored during the procedure by the interventional radiology nurse under my direct supervision. COMPLICATIONS: None immediate. PROCEDURE: Informed consent was obtained from the patient following an explanation of the procedure, risks, benefits and alternatives. The patient understands, agrees and consents for the  procedure. All questions were addressed. A time out was performed prior to the initiation of the procedure. The patient was positioned prone and non-contrast localization CT was performed of the pelvis to demonstrate the iliac  marrow spaces. The operative site was prepped and draped in the usual sterile fashion. Under sterile conditions and local anesthesia, a 22 gauge spinal needle was utilized for procedural planning. Next, an 11 gauge coaxial bone biopsy needle was advanced into the left iliac marrow space. Needle position was confirmed with CT imaging. Initially, bone marrow aspiration was performed. Next, a bone marrow biopsy was obtained with the 11 gauge outer bone marrow device. Samples were prepared with the cytotechnologist and deemed adequate. The needle was removed intact. Hemostasis was obtained with compression and a dressing was placed. The patient tolerated the procedure well without immediate post procedural complication. IMPRESSION: Successful CT guided left iliac bone marrow aspiration and core biopsy. Electronically Signed   By: Sandi Mariscal M.D.   On: 06/21/2017 10:22   Dg Chest Port 1 View  Result Date: 06/22/2017 CLINICAL DATA:  Acute pulmonary edema EXAM: PORTABLE CHEST 1 VIEW COMPARISON:  PA and lateral chest x-ray of May 13, 2017 FINDINGS: The lungs are well-expanded. There is no focal infiltrate. There is no pleural effusion. The heart and pulmonary vascularity are normal. The mediastinum is normal in width. The power port catheter tip projects over the distal third of the SVC. IMPRESSION: There is no acute cardiopulmonary abnormality. Electronically Signed   By: David  Martinique M.D.   On: 06/22/2017 10:37   Ct Bone Marrow Biopsy & Aspiration  Result Date: 06/21/2017 INDICATION: History of lymphoma. Please perform CT-guided bone marrow biopsy for tissue diagnostic purposes. EXAM: CT-GUIDED BONE MARROW BIOPSY AND ASPIRATION MEDICATIONS: None ANESTHESIA/SEDATION: Fentanyl 100 mcg IV; Versed 2 mg IV Sedation Time: 10 minutes; The patient was continuously monitored during the procedure by the interventional radiology nurse under my direct supervision. COMPLICATIONS: None immediate. PROCEDURE: Informed  consent was obtained from the patient following an explanation of the procedure, risks, benefits and alternatives. The patient understands, agrees and consents for the procedure. All questions were addressed. A time out was performed prior to the initiation of the procedure. The patient was positioned prone and non-contrast localization CT was performed of the pelvis to demonstrate the iliac marrow spaces. The operative site was prepped and draped in the usual sterile fashion. Under sterile conditions and local anesthesia, a 22 gauge spinal needle was utilized for procedural planning. Next, an 11 gauge coaxial bone biopsy needle was advanced into the left iliac marrow space. Needle position was confirmed with CT imaging. Initially, bone marrow aspiration was performed. Next, a bone marrow biopsy was obtained with the 11 gauge outer bone marrow device. Samples were prepared with the cytotechnologist and deemed adequate. The needle was removed intact. Hemostasis was obtained with compression and a dressing was placed. The patient tolerated the procedure well without immediate post procedural complication. IMPRESSION: Successful CT guided left iliac bone marrow aspiration and core biopsy. Electronically Signed   By: Sandi Mariscal M.D.   On: 06/21/2017 10:22   Ir Fluoro Guide Port Insertion Right  Result Date: 06/21/2017 INDICATION: History of lymphoma. In need of durable intravenous access for chemotherapy administration. EXAM: IMPLANTED PORT A CATH PLACEMENT WITH ULTRASOUND AND FLUOROSCOPIC GUIDANCE COMPARISON:  Chest CT - 06/08/2017 MEDICATIONS: Ancef 2 gm IV; The antibiotic was administered within an appropriate time interval prior to skin puncture. ANESTHESIA/SEDATION: Moderate (conscious) sedation was employed during this procedure.  A total of Versed 1 mg was administered intravenously. Moderate Sedation Time: 23 minutes. The patient's level of consciousness and vital signs were monitored continuously by  radiology nursing throughout the procedure under my direct supervision. CONTRAST:  None FLUOROSCOPY TIME:  18 seconds (8 mGy) COMPLICATIONS: None immediate. PROCEDURE: The procedure, risks, benefits, and alternatives were explained to the patient. Questions regarding the procedure were encouraged and answered. The patient understands and consents to the procedure. The right neck and chest were prepped with chlorhexidine in a sterile fashion, and a sterile drape was applied covering the operative field. Maximum barrier sterile technique with sterile gowns and gloves were used for the procedure. A timeout was performed prior to the initiation of the procedure. Local anesthesia was provided with 1% lidocaine with epinephrine. After creating a small venotomy incision, a micropuncture kit was utilized to access the internal jugular vein. Real-time ultrasound guidance was utilized for vascular access including the acquisition of a permanent ultrasound image documenting patency of the accessed vessel. The microwire was utilized to measure appropriate catheter length. A subcutaneous port pocket was then created along the upper chest wall utilizing a combination of sharp and blunt dissection. The pocket was irrigated with sterile saline. A single lumen ISP power injectable port was chosen for placement. The 8 Fr catheter was tunneled from the port pocket site to the venotomy incision. The port was placed in the pocket. The external catheter was trimmed to appropriate length. At the venotomy, an 8 Fr peel-away sheath was placed over a guidewire under fluoroscopic guidance. The catheter was then placed through the sheath and the sheath was removed. Final catheter positioning was confirmed and documented with a fluoroscopic spot radiograph. The port was accessed with a Huber needle, aspirated and flushed. The venotomy site was closed with an interrupted 4-0 Vicryl suture. The port pocket incision was closed with interrupted 2-0  Vicryl suture and the skin was opposed with a running subcuticular 4-0 Vicryl suture. Dermabond and Steri-strips were applied to both incisions. The port was left accessed as per recommendations from the providing clinical team. Dressings were placed. The patient tolerated the procedure well without immediate post procedural complication. FINDINGS: After catheter placement, the tip lies within the superior cavoatrial junction. The catheter aspirates and flushes normally and is ready for immediate use. IMPRESSION: Successful placement of a right internal jugular approach power injectable Port-A-Cath. The Port a Catheter was left accessed for immediate use. Electronically Signed   By: Sandi Mariscal M.D.   On: 06/21/2017 10:03   IMPRESSION:  *Elevated LFT's, jaundice:  Likely due to the extensive tumor burden in her liver from the diffuse B-cell lymphoma, liver failure.  INR up slightly.  Haptoglobin ordered and pending.   *Newly diagnosed diffuse large B-cell lymphoma:  Suspected to have tumor lysis syndrome after 2 doses of dexamethasone.  No plans to start chemo now with current state/condition. *AKI:  Renal following as well.  PLAN: *Trend LFT's and await results of haptoglobin. *Will check with Dr. Henrene Pastor regarding need for any other liver serologies. *Otherwise recommendations would be supportive care.  Laban Emperor. Zehr  06/22/2017, 10:41 AM  Pager number [phone]  GI ATTENDING  History, x-rays, laboratories reviewed. Patient personally seen and examined. Multiple family members in room. Asked to see the patient regarding elevated liver tests. Agree with comprehensive consultation note as outlined above. Patient has tumor lysis syndrome with associated renal dysfunction and let light abnormalities. Markedly elevated liver tests are likely secondary to the same given  hepatic involvement with her lymphoma. In addition, contributions from significant hypotension resulting in an element of shock liver.  Her INR is normal. She is lethargic from pain medicine but not encephalopathic. Treatment from our standpoint is purely supportive. Continue with supportive measures and trending liver tests including PT/INR. Discussed with family. GI will follow.  Docia Chuck. Geri Seminole., M.D. [facility] Division of Gastroenterology

## 2017-06-22 NOTE — Consult Note (Signed)
Renal Service Consult Note [place] 06/22/2017 Sol Blazing Requesting Physician:  Dr Maryland Pink  Reason for Consult:  AKI HPI: The patient is a 41 y.o. year-old with history of HTN and DM recently presented about a month ago with abd pain and was found to have multiple liver masses on imaging.  Liver biopsy was done on 11/5 and returned +for diffuse large B-cell lymphoma.  Pt was admitted per ONC to expedite her work-up and get treatment started in light of significant disease burden in the liver.  She was admitted 11/12 with creat of 0.8.  Had bone marrow biopsy, port placement and was started on decadron IV receiving 8 mg IV on 11/12 and 11/13 and a dose of rasburicase 3 mg IV on 11/12 as prophlyaxis for TLS.   Today her creat is up to 2.06 and K 6.3, which is c/w tumor lysis syndrome. She has rec'd 6 mg IV rasburicase and 30 gm po kayexalate today.  BP's are soft and pt is being moved to SDU.  UOP yest was 850 cc. Asked to see for AKI.   Inpatient meds also include acyclovir po 400 qd, atenolol, zoloft, T4, MVI.   IV Ancef x 1, IV dilaudid prn, no contrast and no nsaids/ acei.    Pt is groggy and defers questions to her mother.  Has not been in hospital much per the mother prior to this issue.     Home meds:  -atenolol 50 qd/ losartan 25 qd -levemir insulin 30 qd/ sitagliptin 100 qd -zoloft 100 qd/ norco prn/ klonopin 0.5 tid prn -T4/ reglan prn/ zofran prn   ROS  denies CP  no joint pain   no HA  no blurry vision  no rash   Past Medical History  Past Medical History:  Diagnosis Date  . Cancer ([place])   . Congenital absence of one kidney   . Diabetes mellitus   . Hypertension   . Thyroid disease    Past Surgical History  Past Surgical History:  Procedure Laterality Date  . CESAREAN SECTION  2004  . IR FLUORO GUIDE PORT INSERTION RIGHT  06/21/2017  . IR US GUIDE VASC ACCESS RIGHT  06/21/2017   Family History  Family History   Problem Relation Age of Onset  . Peripheral vascular disease Mother   . Diabetes Father    Social History  reports that she quit smoking about 4 years ago. she has never used smokeless tobacco. She reports that she drinks alcohol. She reports that she does not use drugs. Allergies  Allergies  Allergen Reactions  . Oxycodone Other (See Comments)    Pt stated she hallucinated from the oxycodone   Home medications Prior to Admission medications   Medication Sig Start Date End Date Taking? Authorizing Provider  acetaminophen (TYLENOL) 325 MG tablet Take 2 tablets (650 mg total) by mouth every 6 (six) hours as needed (for pain, fever above 101 and Headache.). 05/15/17  Yes Barton Dubois, MD  atenolol (TENORMIN) 50 MG tablet Take 50 mg by mouth daily.   Yes [provider]  clonazePAM (KLONOPIN) 0.5 MG tablet Take 0.5 mg by mouth 3 (three) times daily as needed for anxiety.    Yes [provider]  docusate sodium (COLACE) 100 MG capsule Take 1 capsule (100 mg total) 2 (two) times daily by mouth. 06/14/17  Yes Barton Dubois, MD  HYDROcodone-acetaminophen (NORCO/VICODIN) 5-325 MG tablet Take 1-2 tablets every 6 (six) hours as needed by mouth  for severe pain. 06/14/17  Yes Barton Dubois, MD  insulin detemir (LEVEMIR) 100 unit/ml SOLN Inject 0.3 mLs (30 Units total) into the skin at bedtime. Patient taking differently: Inject 30 Units into the skin at bedtime as needed (low sugar).  05/15/17  Yes Barton Dubois, MD  Insulin Pen Needle 31G X 5 MM MISC Use as needed to inject insulin daily as instructed 05/15/17  Yes Barton Dubois, MD  levothyroxine (SYNTHROID, LEVOTHROID) 200 MCG tablet Take 200 mcg daily before breakfast by mouth.    Yes [provider]  losartan (COZAAR) 25 MG tablet Take 1 tablet (25 mg total) daily by mouth. 06/14/17  Yes Barton Dubois, MD  metoCLOPramide (REGLAN) 10 MG tablet Take 1 tablet (10 mg total) every 6 (six) hours as needed for up to 7 days by  mouth for nausea. 06/16/17 06/23/17 Yes Gorsuch, Ni, MD  ondansetron (ZOFRAN) 4 MG tablet Take 1 tablet (4 mg total) by mouth every 6 (six) hours as needed for nausea or vomiting. 57/3/22  Yes Delora Fuel, MD  sertraline (ZOLOFT) 100 MG tablet Take 100 mg daily by mouth.    Yes [provider]  sitaGLIPtin (JANUVIA) 100 MG tablet Take 100 mg by mouth daily.   Yes [provider]   Liver Function Tests Recent Labs  Lab 06/21/17 0359 06/22/17 0459 06/22/17 0810  AST 107* 1,252* 2,245*  ALT 52 299* 446*  ALKPHOS 295* 358* 409*  BILITOT 6.2* 5.7* 6.0*  PROT 6.0* 6.8 6.5  ALBUMIN 2.2* 2.5* 2.5*   No results for input(s): LIPASE, AMYLASE in the last 168 hours. CBC Recent Labs  Lab 06/18/2017 1233 06/21/17 0359 06/22/17 0810  WBC 11.6* 10.1 10.7*  NEUTROABS 9.2* 7.9* PENDING  HGB 14.1 12.7 13.2  HCT 42.5 40.5 41.6  MCV 78.1 79.3 80.8  PLT PLATELET CLUMPS NOTED ON SMEAR, COUNT APPEARS ADEQUATE 168 025   Basic Metabolic Panel Recent Labs  Lab 06/24/2017 1233 06/21/17 0359 06/22/17 0459 06/22/17 0810  NA 133* 134* 133* 135  K 3.4* 3.8 6.3* 6.0*  CL 96* 99* 99* 100*  CO2 25 24 20* 21*  GLUCOSE 50* 52* 107* 93  BUN 15 17 30* 33*  CREATININE 0.76 0.85 2.06* 2.18*  CALCIUM 9.4 9.0 8.8* 8.5*   Iron/TIBC/Ferritin/ %Sat No results found for: IRON, TIBC, FERRITIN, IRONPCTSAT  Vitals:   06/22/17 0430 06/22/17 0615 06/22/17 0900 06/22/17 0917  BP: 104/69 (!) 81/55 (!) 85/62 (!) 86/54  Pulse: 82  80 82  Resp: (!) 8  11   Temp: 98.8 F (37.1 C)  98.3 F (36.8 C)   TempSrc: Oral  Oral   SpO2: 97%  96%   Weight:      Height:       Exam Gen groggy, sedated, arouses easily and follows all commands, Ox 3 No rash, cyanosis or gangrene Sclera anicteric, throat clear and dry  No jvd or bruits, flat neck veins Chest clear bilat to bases RRR no MRG Abd obese, large liver down 8 cm, distended/ obese, dec'd BS, no rebound GU deferred MS no joint effusions or  deformity Ext no sig pitting LE or UE edema / no wounds or ulcers Neuro is lethargic, but Ox 3  10/31 UA 6-30 wbc, no rbc 10/31 UNa < 10 2017 abd [place] > Right Kidney: Length: 13.1 cm. 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  Left Kidney:  Surgically absent.    Na 135  K 6.0  CO2 21  BUN 33  Cr 2.18   Ca 8.5   AST 2245  ALT 446  Tbili 6.0   eGFR 30 WBC 10k  Hb 13  plt 196 CXR - not done Baseline creat 0.8 - 1.1 from Nov 2018  Impression: 1. Acute renal failure - due to tumor lysis syndrome.  Getting rasburicase per oncology.  Looks dry , will ^IVF"s.  Will need HD/ CRRT if K+ gets out of control.  Getting rasburicase at 3-6 mg / d per ONC.  Add phos levels bid and K+ levels every 6 hrs for now. Phos binders if patient eating and phos is high. Kayexalate 30 gm tid for now empirically.  Will follow.  2. 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 B cell lymphoma - recently dx'd with heavy liver involvement 4. ^LFT"s - prob d/t #3 5. Hypovolemia 6. DM - per primary 7. HTN - bp's meds on hold    Plan - as above  Kelly Splinter MD [facility] pager [phone]   06/22/2017, 9:54 AM

## 2017-06-22 NOTE — Progress Notes (Signed)
Pt transferred to 1228 with all belongings. Pt's mother at bedside at time of transfer. Report given to Antonito, RN at bedside.

## 2017-06-22 NOTE — Progress Notes (Signed)
02 sats with 02 at 2l 91% to 98% with respirations at 8/min

## 2017-06-22 NOTE — Progress Notes (Signed)
   06/22/17 1000  Clinical Encounter Type  Visited With Patient and family together  Visit Type Initial  Referral From Nurse  Consult/Referral To Chaplain  Spiritual Encounters  Spiritual Needs Prayer;Emotional;Grief support;Other (Comment) (Pastoral Conversation/Support)  Stress Factors  Patient Stress Factors Health changes;Major life changes  Family Stress Factors Health changes;Loss;Major life changes;Other (Comment) (Unprocessed grief )   I visited with the patient and her mother, Rodena Piety, per referral from the nurse. The patient had been moved from another unit into the ICU. The patient was groggy during our visit, but stated that she was feeling emotionally hopeful that her current health condition would get better.  The patient's mother was at the bedside and tearful. She stated that her she was praying that the medication would help the patient get better. The patient's mother stated that she lost her other daughter of pneumonia 13 years ago, and that the patient's health condition stirs up her grief over that loss. Death concerns are a trigger for her grief.  The patient and her mother requested prayer for the medications to work, and for healing. I prayed with them at the bedside. The patient's mother requested that I follow up. I provided a calming pastoral presence, a prayer shawl, prayer and normalization of emotions during this visit. We briefly touched on grief work, but our visit was cut short due to the physician's visit.   I will follow up with the patient and her mother at a later time.   Please, contact Spiritual Care for further assistance.   Osakis M.Div.

## 2017-06-22 NOTE — Consult Note (Addendum)
[facility] Medical Consultation  Patricia Mcclain AOZ:[id number] DOB: Nov 17, 1975 DOA: 07/02/2017   PCP: Levin Erp, MD    Requesting physician: Dr. Alvy Bimler  Date of consultation: 06/22/17  Reason for consultation: Multiple medical issues  Chief Complaint: Feeling weak  HPI: Patricia Mcclain is a 41 y.o. 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  She was found to have liver involvement.  She was hospitalized to undergo further evaluation and to initiate treatment.  She was given steroids.  Subsequently she developed tumor lysis syndrome.  She was given 1 dose of rasburicase on November 12.  Over the last 24 hours patient has become more fatigued.  Her urine output has dropped off.  This morning she was noted to have worsening renal function worsening uric acid level and worsening LFTs.  She was noted to be hyperkalemic.  She has multiple other medical problems.  So oncology requested that we assist with management.  Patient states that she is feeling better currently.  She was having abdominal pain earlier today and was given pain medicines.  Her mother is at the bedside.  Patient denies any chest pain, shortness of breath, headaches.  She is feeling somewhat drowsy after receiving the pain medicines.  She was noted to have shallow respirations as well.   Home Medications: Prior to Admission medications   Medication Sig Start Date End Date Taking? Authorizing Provider  acetaminophen (TYLENOL) 325 MG tablet Take 2 tablets (650 mg total) by mouth every 6 (six) hours as needed (for pain, fever above 101 and Headache.). 05/15/17  Yes Barton Dubois, MD  atenolol (TENORMIN) 50 MG tablet Take 50 mg by mouth daily.   Yes [provider]  clonazePAM (KLONOPIN) 0.5 MG tablet Take 0.5 mg by mouth 3 (three) times daily as needed for anxiety.    Yes  [provider]  docusate sodium (COLACE) 100 MG capsule Take 1 capsule (100 mg total) 2 (two) times daily by mouth. 06/14/17  Yes Barton Dubois, MD  HYDROcodone-acetaminophen (NORCO/VICODIN) 5-325 MG tablet Take 1-2 tablets every 6 (six) hours as needed by mouth for severe pain. 06/14/17  Yes Barton Dubois, MD  insulin detemir (LEVEMIR) 100 unit/ml SOLN Inject 0.3 mLs (30 Units total) into the skin at bedtime. Patient taking differently: Inject 30 Units into the skin at bedtime as needed (low sugar).  05/15/17  Yes Barton Dubois, MD  Insulin Pen Needle 31G X 5 MM MISC Use as needed to inject insulin daily as instructed 05/15/17  Yes Barton Dubois, MD  levothyroxine (SYNTHROID, LEVOTHROID) 200 MCG tablet Take 200 mcg daily before breakfast by mouth.    Yes [provider]  losartan (COZAAR) 25 MG tablet Take 1 tablet (25 mg total) daily by mouth. 06/14/17  Yes Barton Dubois, MD  metoCLOPramide (REGLAN) 10 MG tablet Take 1 tablet (10 mg total) every 6 (six) hours as needed for up to 7 days by mouth for nausea. 06/16/17 06/23/17 Yes Gorsuch, Ni, MD  ondansetron (ZOFRAN) 4 MG tablet Take 1 tablet (4 mg total) by mouth every 6 (six) hours as needed for nausea or vomiting. 96/2/95  Yes Delora Fuel, MD  sertraline (ZOLOFT) 100 MG tablet Take 100 mg daily by mouth.    Yes [provider]  sitaGLIPtin (JANUVIA) 100 MG tablet Take 100 mg by mouth daily.   Yes [provider]    Current Inpatient Medications:  Scheduled: . acyclovir  400 mg Oral Daily  . docusate sodium  100 mg Oral BID  . furosemide  40 mg Intravenous Once  . insulin aspart  0-24 Units Subcutaneous TID WC  . lactose free nutrition  237 mL Oral TID WC  . levothyroxine  200 mcg Oral QAC breakfast  . multivitamin with minerals  1 tablet Oral Daily  . sertraline  200 mg Oral Daily  . sodium bicarbonate  100 mEq Intravenous Once   Continuous: . sodium chloride 125 mL/hr at 06/22/17 0851  .  ondansetron (ZOFRAN) IV     KPT:WSFKCLEXNTZGY, alum & mag hydroxide-simeth, clonazePAM, diphenhydrAMINE, diphenhydrAMINE, guaiFENesin-dextromethorphan, hydrocortisone, HYDROmorphone (DILAUDID) injection, lidocaine-prilocaine, ondansetron **OR** ondansetron **OR** ondansetron (ZOFRAN) IV **OR** ondansetron (ZOFRAN) IV, senna-docusate  Allergies:  Allergies  Allergen Reactions  . Oxycodone Other (See Comments)    Pt stated she hallucinated from the oxycodone    Past Medical History: Past Medical History:  Diagnosis Date  . Cancer ([place])   . Congenital absence of one kidney   . Diabetes mellitus   . Hypertension   . Thyroid disease     Past Surgical History:  Procedure Laterality Date  . CESAREAN SECTION  2004  . IR FLUORO GUIDE PORT INSERTION RIGHT  06/21/2017  . IR US GUIDE VASC ACCESS RIGHT  06/21/2017    Social History:  Social History   Socioeconomic History  . Marital status: Single    Spouse name: Not on file  . Number of children: Not on file  . Years of education: Not on file  . Highest education level: Not on file  Social Needs  . Financial resource strain: Not on file  . Food insecurity - worry: Not on file  . Food insecurity - inability: Not on file  . Transportation needs - medical: Not on file  . Transportation needs - non-medical: Not on file  Occupational History  . Not on file  Tobacco Use  . Smoking status: Former Smoker    Last attempt to quit: 06/06/2013    Years since quitting: 4.0  . Smokeless tobacco: Never Used  Substance and Sexual Activity  . Alcohol use: Yes  . Drug use: No  . Sexual activity: Yes    Birth control/protection: None  Other Topics Concern  . Not on file  Social History Narrative  . Not on file     Family History:  Family History  Problem Relation Age of Onset  . Peripheral vascular disease Mother   . Diabetes Father      Review of Systems -currently unable to do due to her somnolent.  Physical  Examination: Vitals:   06/21/17 2015 06/21/17 2300 06/22/17 0430 06/22/17 0615  BP: 98/76  104/69 (!) 81/55  Pulse: 89 67 82   Resp: 14  (!) 8   Temp: 98 F (36.7 C)  98.8 F (37.1 C)   TempSrc: Oral  Oral   SpO2: 93% 98% 97%   Weight:      Height:        General appearance: Somnolent but easily arousable.  Follows commands.  No distress. Head: Normocephalic, without obvious abnormality, atraumatic Throat: lips, mucosa, and tongue normal; teeth and gums normal Resp: clear to auscultation bilaterally Cardio: regular rate and rhythm, S1, S2 normal, no murmur, click, rub or gallop GI: Abdomen is slightly distended.  Tender on the right side without any rebound rigidity or guarding.  Bowel sounds are present.  She has hepatomegaly. Extremities: extremities normal, atraumatic, no cyanosis  or edema Skin: Skin color, texture, turgor normal. No rashes or lesions Neurologic: No focal deficits.  Moving all extremities.  Able to lift her legs.  Laboratory Data: Results for orders placed or performed during the hospital encounter of 06/13/2017 (from the past 48 hour(s))  Glucose, capillary     Status: Abnormal   Collection Time: 07/08/2017 12:15 PM  Result Value Ref Range   Glucose-Capillary 44 (LL) 65 - 99 mg/dL   Comment 1 Notify RN    Comment 2 Document in Chart   Comprehensive metabolic panel     Status: Abnormal   Collection Time: 06/29/2017 12:33 PM  Result Value Ref Range   Sodium 133 (L) 135 - 145 mmol/L   Potassium 3.4 (L) 3.5 - 5.1 mmol/L   Chloride 96 (L) 101 - 111 mmol/L   CO2 25 22 - 32 mmol/L   Glucose, Bld 50 (L) 65 - 99 mg/dL   BUN 15 6 - 20 mg/dL   Creatinine, Ser 0.76 0.44 - 1.00 mg/dL   Calcium 9.4 8.9 - 10.3 mg/dL   Total Protein 6.5 6.5 - 8.1 g/dL   Albumin 2.5 (L) 3.5 - 5.0 g/dL   AST 123 (H) 15 - 41 U/L   ALT 55 (H) 14 - 54 U/L   Alkaline Phosphatase 311 (H) 38 - 126 U/L   Total Bilirubin 8.3 (H) 0.3 - 1.2 mg/dL   GFR calc non Af Amer >60 >60 mL/min   GFR calc  Af Amer >60 >60 mL/min    Comment: (NOTE) The eGFR has been calculated using the CKD EPI equation. This calculation has not been validated in all clinical situations. eGFR's persistently <60 mL/min signify possible Chronic Kidney Disease.    Anion gap 12 5 - 15  CBC WITH DIFFERENTIAL     Status: Abnormal   Collection Time: 06/10/2017 12:33 PM  Result Value Ref Range   WBC 11.6 (H) 4.0 - 10.5 K/uL   RBC 5.44 (H) 3.87 - 5.11 MIL/uL   Hemoglobin 14.1 12.0 - 15.0 g/dL   HCT 42.5 36.0 - 46.0 %   MCV 78.1 78.0 - 100.0 fL   MCH 25.9 (L) 26.0 - 34.0 pg   MCHC 33.2 30.0 - 36.0 g/dL   RDW 28.4 (H) 11.5 - 15.5 %   Platelets  150 - 400 K/uL    PLATELET CLUMPS NOTED ON SMEAR, COUNT APPEARS ADEQUATE    Comment: SPECIMEN CHECKED FOR CLOTS REPEATED TO VERIFY PLATELET COUNT CONFIRMED BY SMEAR    Neutrophils Relative % 79 %   Lymphocytes Relative 10 %   Monocytes Relative 8 %   Eosinophils Relative 2 %   Basophils Relative 1 %   Neutro Abs 9.2 (H) 1.7 - 7.7 K/uL   Lymphs Abs 1.2 0.7 - 4.0 K/uL   Monocytes Absolute 0.9 0.1 - 1.0 K/uL   Eosinophils Absolute 0.2 0.0 - 0.7 K/uL   Basophils Absolute 0.1 0.0 - 0.1 K/uL   RBC Morphology TARGET CELLS   Hepatitis B surface antibody     Status: None   Collection Time: 07/03/2017 12:33 PM  Result Value Ref Range   Hep B S Ab Non Reactive     Comment: (NOTE)              Non Reactive: Inconsistent with immunity,                            less than 10 mIU/mL  Reactive:     Consistent with immunity,                            greater than 9.9 mIU/mL Performed At: [facility] [place], [place] [id number] Rush Farmer MD OI:[id number]   Hepatitis B surface antigen     Status: None   Collection Time: 06/25/2017 12:33 PM  Result Value Ref Range   Hepatitis B Surface Ag Negative Negative    Comment: (NOTE) Performed At: [facility] [place], [place] [id number] Rush Farmer MD [id number]    Hepatitis B core antibody, IgM     Status: None   Collection Time: 07/03/2017 12:33 PM  Result Value Ref Range   Hep B C IgM Negative Negative    Comment: (NOTE) Performed At: [facility] [address] [place], [place] [id number] Rush Farmer MD QK:[id number]   Uric acid     Status: Abnormal   Collection Time: 06/22/2017 12:33 PM  Result Value Ref Range   Uric Acid, Serum 9.4 (H) 2.3 - 6.6 mg/dL  Lactate dehydrogenase     Status: Abnormal   Collection Time: 06/27/2017 12:33 PM  Result Value Ref Range   LDH 2,111 (H) 98 - 192 U/L  Glucose, capillary     Status: None   Collection Time: 06/27/2017  1:40 PM  Result Value Ref Range   Glucose-Capillary 85 65 - 99 mg/dL   Comment 1 Notify RN    Comment 2 Document in Chart   Glucose, capillary     Status: None   Collection Time: 06/30/2017  5:27 PM  Result Value Ref Range   Glucose-Capillary 84 65 - 99 mg/dL   Comment 1 Notify RN    Comment 2 Document in Chart   CBC with Differential/Platelet     Status: Abnormal   Collection Time: 06/21/17  3:59 AM  Result Value Ref Range   WBC 10.1 4.0 - 10.5 K/uL   RBC 5.11 3.87 - 5.11 MIL/uL   Hemoglobin 12.7 12.0 - 15.0 g/dL   HCT 40.5 36.0 - 46.0 %   MCV 79.3 78.0 - 100.0 fL   MCH 24.9 (L) 26.0 - 34.0 pg   MCHC 31.4 30.0 - 36.0 g/dL   RDW 28.4 (H) 11.5 - 15.5 %   Platelets 168 150 - 400 K/uL    Comment: REPEATED TO VERIFY   Neutrophils Relative % 78 %   Lymphocytes Relative 10 %   Monocytes Relative 9 %   Eosinophils Relative 2 %   Basophils Relative 1 %   Neutro Abs 7.9 (H) 1.7 - 7.7 K/uL   Lymphs Abs 1.0 0.7 - 4.0 K/uL   Monocytes Absolute 0.9 0.1 - 1.0 K/uL   Eosinophils Absolute 0.2 0.0 - 0.7 K/uL   Basophils Absolute 0.1 0.0 - 0.1 K/uL   RBC Morphology POLYCHROMASIA PRESENT     Comment: TARGET CELLS   WBC Morphology TOXIC GRANULATION     Comment: VACUOLATED NEUTROPHILS  Protime-INR     Status: None   Collection Time: 06/21/17  3:59 AM  Result Value Ref Range    Prothrombin Time 14.2 11.4 - 15.2 seconds   INR 1.11   Comprehensive metabolic panel     Status: Abnormal   Collection Time: 06/21/17  3:59 AM  Result Value Ref Range   Sodium 134 (L) 135 - 145 mmol/L   Potassium 3.8 3.5 - 5.1 mmol/L   Chloride 99 (  L) 101 - 111 mmol/L   CO2 24 22 - 32 mmol/L   Glucose, Bld 52 (L) 65 - 99 mg/dL   BUN 17 6 - 20 mg/dL   Creatinine, Ser 0.85 0.44 - 1.00 mg/dL   Calcium 9.0 8.9 - 10.3 mg/dL   Total Protein 6.0 (L) 6.5 - 8.1 g/dL   Albumin 2.2 (L) 3.5 - 5.0 g/dL   AST 107 (H) 15 - 41 U/L   ALT 52 14 - 54 U/L   Alkaline Phosphatase 295 (H) 38 - 126 U/L   Total Bilirubin 6.2 (H) 0.3 - 1.2 mg/dL   GFR calc non Af Amer >60 >60 mL/min   GFR calc Af Amer >60 >60 mL/min    Comment: (NOTE) The eGFR has been calculated using the CKD EPI equation. This calculation has not been validated in all clinical situations. eGFR's persistently <60 mL/min signify possible Chronic Kidney Disease.    Anion gap 11 5 - 15  Glucose, capillary     Status: Abnormal   Collection Time: 06/21/17  8:00 AM  Result Value Ref Range   Glucose-Capillary 39 (LL) 65 - 99 mg/dL   Comment 1 Notify RN   Glucose, capillary     Status: None   Collection Time: 06/21/17 11:13 AM  Result Value Ref Range   Glucose-Capillary 85 65 - 99 mg/dL  Glucose, capillary     Status: Abnormal   Collection Time: 06/21/17  6:24 PM  Result Value Ref Range   Glucose-Capillary 137 (H) 65 - 99 mg/dL  Glucose, capillary     Status: Abnormal   Collection Time: 06/21/17  8:38 PM  Result Value Ref Range   Glucose-Capillary 127 (H) 65 - 99 mg/dL  Comprehensive metabolic panel     Status: Abnormal   Collection Time: 06/22/17  4:59 AM  Result Value Ref Range   Sodium 133 (L) 135 - 145 mmol/L   Potassium 6.3 (HH) 3.5 - 5.1 mmol/L    Comment: DELTA CHECK NOTED REPEATED TO VERIFY NO VISIBLE HEMOLYSIS CRITICAL RESULT CALLED TO, READ BACK BY AND VERIFIED WITH: YERGENS,D RN 11.14.18 _0  ZANDO,C    Chloride 99  (L) 101 - 111 mmol/L   CO2 20 (L) 22 - 32 mmol/L   Glucose, Bld 107 (H) 65 - 99 mg/dL   BUN 30 (H) 6 - 20 mg/dL   Creatinine, Ser 2.06 (H) 0.44 - 1.00 mg/dL    Comment: DELTA CHECK NOTED REPEATED TO VERIFY    Calcium 8.8 (L) 8.9 - 10.3 mg/dL   Total Protein 6.8 6.5 - 8.1 g/dL   Albumin 2.5 (L) 3.5 - 5.0 g/dL   AST 1,252 (H) 15 - 41 U/L   ALT 299 (H) 14 - 54 U/L   Alkaline Phosphatase 358 (H) 38 - 126 U/L   Total Bilirubin 5.7 (H) 0.3 - 1.2 mg/dL   GFR calc non Af Amer 29 (L) >60 mL/min   GFR calc Af Amer 33 (L) >60 mL/min    Comment: (NOTE) The eGFR has been calculated using the CKD EPI equation. This calculation has not been validated in all clinical situations. eGFR's persistently <60 mL/min signify possible Chronic Kidney Disease.    Anion gap 14 5 - 15  Uric acid     Status: Abnormal   Collection Time: 06/22/17  4:59 AM  Result Value Ref Range   Uric Acid, Serum 13.0 (H) 2.3 - 6.6 mg/dL  Glucose, capillary     Status: None   Collection Time: 06/22/17  6:33 AM  Result Value Ref Range   Glucose-Capillary 90 65 - 99 mg/dL  Glucose, capillary     Status: None   Collection Time: 06/22/17  7:40 AM  Result Value Ref Range   Glucose-Capillary 99 65 - 99 mg/dL    Imaging Studies: Ir US Guide Vasc Access Right  Result Date: 06/21/2017 INDICATION: History of lymphoma. In need of durable intravenous access for chemotherapy administration. EXAM: IMPLANTED PORT A CATH PLACEMENT WITH ULTRASOUND AND FLUOROSCOPIC GUIDANCE COMPARISON:  Chest CT - 06/08/2017 MEDICATIONS: Ancef 2 gm IV; The antibiotic was administered within an appropriate time interval prior to skin puncture. ANESTHESIA/SEDATION: Moderate (conscious) sedation was employed during this procedure. A total of Versed 1 mg was administered intravenously. Moderate Sedation Time: 23 minutes. The patient's level of consciousness and vital signs were monitored continuously by radiology nursing throughout the procedure under my  direct supervision. CONTRAST:  None FLUOROSCOPY TIME:  18 seconds (8 mGy) COMPLICATIONS: None immediate. PROCEDURE: The procedure, risks, benefits, and alternatives were explained to the patient. Questions regarding the procedure were encouraged and answered. The patient understands and consents to the procedure. The right neck and chest were prepped with chlorhexidine in a sterile fashion, and a sterile drape was applied covering the operative field. Maximum barrier sterile technique with sterile gowns and gloves were used for the procedure. A timeout was performed prior to the initiation of the procedure. Local anesthesia was provided with 1% lidocaine with epinephrine. After creating a small venotomy incision, a micropuncture kit was utilized to access the internal jugular vein. Real-time ultrasound guidance was utilized for vascular access including the acquisition of a permanent ultrasound image documenting patency of the accessed vessel. The microwire was utilized to measure appropriate catheter length. A subcutaneous port pocket was then created along the upper chest wall utilizing a combination of sharp and blunt dissection. The pocket was irrigated with sterile saline. A single lumen ISP power injectable port was chosen for placement. The 8 Fr catheter was tunneled from the port pocket site to the venotomy incision. The port was placed in the pocket. The external catheter was trimmed to appropriate length. At the venotomy, an 8 Fr peel-away sheath was placed over a guidewire under fluoroscopic guidance. The catheter was then placed through the sheath and the sheath was removed. Final catheter positioning was confirmed and documented with a fluoroscopic spot radiograph. The port was accessed with a Huber needle, aspirated and flushed. The venotomy site was closed with an interrupted 4-0 Vicryl suture. The port pocket incision was closed with interrupted 2-0 Vicryl suture and the skin was opposed with a  running subcuticular 4-0 Vicryl suture. Dermabond and Steri-strips were applied to both incisions. The port was left accessed as per recommendations from the providing clinical team. Dressings were placed. The patient tolerated the procedure well without immediate post procedural complication. FINDINGS: After catheter placement, the tip lies within the superior cavoatrial junction. The catheter aspirates and flushes normally and is ready for immediate use. IMPRESSION: Successful placement of a right internal jugular approach power injectable Port-A-Cath. The Port a Catheter was left accessed for immediate use. Electronically Signed   By: Sandi Mariscal M.D.   On: 06/21/2017 10:03   Ct Biopsy  Result Date: 06/21/2017 INDICATION: History of lymphoma. Please perform CT-guided bone marrow biopsy for tissue diagnostic purposes. EXAM: CT-GUIDED BONE MARROW BIOPSY AND ASPIRATION MEDICATIONS: None ANESTHESIA/SEDATION: Fentanyl 100 mcg IV; Versed 2 mg IV Sedation Time: 10 minutes; The patient was continuously monitored during the  procedure by the interventional radiology nurse under my direct supervision. COMPLICATIONS: None immediate. PROCEDURE: Informed consent was obtained from the patient following an explanation of the procedure, risks, benefits and alternatives. The patient understands, agrees and consents for the procedure. All questions were addressed. A time out was performed prior to the initiation of the procedure. The patient was positioned prone and non-contrast localization CT was performed of the pelvis to demonstrate the iliac marrow spaces. The operative site was prepped and draped in the usual sterile fashion. Under sterile conditions and local anesthesia, a 22 gauge spinal needle was utilized for procedural planning. Next, an 11 gauge coaxial bone biopsy needle was advanced into the left iliac marrow space. Needle position was confirmed with CT imaging. Initially, bone marrow aspiration was performed.  Next, a bone marrow biopsy was obtained with the 11 gauge outer bone marrow device. Samples were prepared with the cytotechnologist and deemed adequate. The needle was removed intact. Hemostasis was obtained with compression and a dressing was placed. The patient tolerated the procedure well without immediate post procedural complication. IMPRESSION: Successful CT guided left iliac bone marrow aspiration and core biopsy. Electronically Signed   By: Sandi Mariscal M.D.   On: 06/21/2017 10:22   Ct Bone Marrow Biopsy & Aspiration  Result Date: 06/21/2017 INDICATION: History of lymphoma. Please perform CT-guided bone marrow biopsy for tissue diagnostic purposes. EXAM: CT-GUIDED BONE MARROW BIOPSY AND ASPIRATION MEDICATIONS: None ANESTHESIA/SEDATION: Fentanyl 100 mcg IV; Versed 2 mg IV Sedation Time: 10 minutes; The patient was continuously monitored during the procedure by the interventional radiology nurse under my direct supervision. COMPLICATIONS: None immediate. PROCEDURE: Informed consent was obtained from the patient following an explanation of the procedure, risks, benefits and alternatives. The patient understands, agrees and consents for the procedure. All questions were addressed. A time out was performed prior to the initiation of the procedure. The patient was positioned prone and non-contrast localization CT was performed of the pelvis to demonstrate the iliac marrow spaces. The operative site was prepped and draped in the usual sterile fashion. Under sterile conditions and local anesthesia, a 22 gauge spinal needle was utilized for procedural planning. Next, an 11 gauge coaxial bone biopsy needle was advanced into the left iliac marrow space. Needle position was confirmed with CT imaging. Initially, bone marrow aspiration was performed. Next, a bone marrow biopsy was obtained with the 11 gauge outer bone marrow device. Samples were prepared with the cytotechnologist and deemed adequate. The needle was  removed intact. Hemostasis was obtained with compression and a dressing was placed. The patient tolerated the procedure well without immediate post procedural complication. IMPRESSION: Successful CT guided left iliac bone marrow aspiration and core biopsy. Electronically Signed   By: Sandi Mariscal M.D.   On: 06/21/2017 10:22   Ir Fluoro Guide Port Insertion Right  Result Date: 06/21/2017 INDICATION: History of lymphoma. In need of durable intravenous access for chemotherapy administration. EXAM: IMPLANTED PORT A CATH PLACEMENT WITH ULTRASOUND AND FLUOROSCOPIC GUIDANCE COMPARISON:  Chest CT - 06/08/2017 MEDICATIONS: Ancef 2 gm IV; The antibiotic was administered within an appropriate time interval prior to skin puncture. ANESTHESIA/SEDATION: Moderate (conscious) sedation was employed during this procedure. A total of Versed 1 mg was administered intravenously. Moderate Sedation Time: 23 minutes. The patient's level of consciousness and vital signs were monitored continuously by radiology nursing throughout the procedure under my direct supervision. CONTRAST:  None FLUOROSCOPY TIME:  18 seconds (8 mGy) COMPLICATIONS: None immediate. PROCEDURE: The procedure, risks, benefits, and alternatives were  explained to the patient. Questions regarding the procedure were encouraged and answered. The patient understands and consents to the procedure. The right neck and chest were prepped with chlorhexidine in a sterile fashion, and a sterile drape was applied covering the operative field. Maximum barrier sterile technique with sterile gowns and gloves were used for the procedure. A timeout was performed prior to the initiation of the procedure. Local anesthesia was provided with 1% lidocaine with epinephrine. After creating a small venotomy incision, a micropuncture kit was utilized to access the internal jugular vein. Real-time ultrasound guidance was utilized for vascular access including the acquisition of a permanent  ultrasound image documenting patency of the accessed vessel. The microwire was utilized to measure appropriate catheter length. A subcutaneous port pocket was then created along the upper chest wall utilizing a combination of sharp and blunt dissection. The pocket was irrigated with sterile saline. A single lumen ISP power injectable port was chosen for placement. The 8 Fr catheter was tunneled from the port pocket site to the venotomy incision. The port was placed in the pocket. The external catheter was trimmed to appropriate length. At the venotomy, an 8 Fr peel-away sheath was placed over a guidewire under fluoroscopic guidance. The catheter was then placed through the sheath and the sheath was removed. Final catheter positioning was confirmed and documented with a fluoroscopic spot radiograph. The port was accessed with a Huber needle, aspirated and flushed. The venotomy site was closed with an interrupted 4-0 Vicryl suture. The port pocket incision was closed with interrupted 2-0 Vicryl suture and the skin was opposed with a running subcuticular 4-0 Vicryl suture. Dermabond and Steri-strips were applied to both incisions. The port was left accessed as per recommendations from the providing clinical team. Dressings were placed. The patient tolerated the procedure well without immediate post procedural complication. FINDINGS: After catheter placement, the tip lies within the superior cavoatrial junction. The catheter aspirates and flushes normally and is ready for immediate use. IMPRESSION: Successful placement of a right internal jugular approach power injectable Port-A-Cath. The Port a Catheter was left accessed for immediate use. Electronically Signed   By: Sandi Mariscal M.D.   On: 06/21/2017 10:03    EKG: Sinus rhythm at 80 bpm without any prominent T waves.  No concerning ischemic changes.  Impression/Recommendations  Active Problems:   Type 2 diabetes mellitus with renal manifestations (HCC)    Abdominal pain   Diffuse large B cell lymphoma (HCC)   Elevated liver enzymes   This is a 41 year old African-American female with a past medical history of insulin-dependent diabetes mellitus, hypothyroidism, hypertension recently diagnosed with a large B-cell lymphoma and was hospitalized for further management.  She received steroids.  She has developed tumor lysis syndrome with elevated uric acid level.  She has also significantly elevated LFTs and has developed acute renal failure.  She is noted to be hyperkalemic.  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  Fluid bolus has been ordered by the oncologist.  IV fluids has been ordered by them as well.  They are also contacting nephrology.  She has been given Kayexalate.  We will give her 2 amps of sodium bicarbonate.  Labs to be repeated later today.  Monitor urine output.  UA will be ordered as well.  Renal ultrasound.  May have to Place Foley catheter.  Monitor on telemetry till potassium level has improved.  #2 newly diagnosed B-cell lymphoma with  liver involvement: She is noted to have liver lesions.  LFTs are remarkably abnormal this morning.  Oncology to consult gastroenterology.  These lesions were noted recently on an ultrasound as well as CT scan.  Patient has been treated with steroids with plans to give definitive treatment soon.  #3 tumor lysis syndrome: Uric acid level noted to be significantly elevated.  She has been ordered another dose of rasburicase by oncology.  Along with Lasix.  Uric acid levels to be checked daily.  #4  History of insulin-dependent diabetes: Blood glucose levels were borderline low so her long-acting insulin was discontinued.  Agree with same for now.  Continue just sliding scale coverage.  HbA1c was 12 in October.  #5 history of essential hypertension: Blood pressure is noted to be borderline low.  Hold her antihypertensive agents.  Despite  IV fluid bolus blood pressure has not improved much.  Heart rate remains stable.  We will transfer her to stepdown/ICU instead of telemetry for closer monitoring.  She will be given more IV fluid boluses.  Check lactic acid level.  No evidence for sepsis at this time.  All of this is most likely due to hypovolemia/tumor lysis.  Discussed also with critical care medicine who will evaluate patient and give their input.  Code Status: Full Code DVT Prophylaxis: SCDs    Family Communication: Discussed with the patient and her mother Disposition Plan: Management as outlined above.  [facility] will assume care per oncology request.  [facility]  [facility] Pager [phone]  If 7PM-7AM, please contact night-coverage.  [email or web address] Password [email or web address]  06/22/2017, 8:52 AM

## 2017-06-22 NOTE — Progress Notes (Signed)
I have placed urgent consult and discussed her case with the gastroenterologist and nephrologist Case is discussed with hospitalist who has accepted transfer of care I will continue to follow the patient the next few days

## 2017-06-22 NOTE — Progress Notes (Signed)
Date: June 22, 2017 Velva Harman, BSN, [place], [place] Chart and notes review for patient progress and needs./ iv [id number] drip Will follow for case management and discharge needs. Next review date: [id number]

## 2017-06-23 ENCOUNTER — Inpatient Hospital Stay (HOSPITAL_COMMUNITY): Payer: Medicaid Other

## 2017-06-23 DIAGNOSIS — R17 Unspecified jaundice: Secondary | ICD-10-CM

## 2017-06-23 DIAGNOSIS — E883 Tumor lysis syndrome: Secondary | ICD-10-CM

## 2017-06-23 DIAGNOSIS — J9601 Acute respiratory failure with hypoxia: Secondary | ICD-10-CM

## 2017-06-23 DIAGNOSIS — K7201 Acute and subacute hepatic failure with coma: Secondary | ICD-10-CM

## 2017-06-23 DIAGNOSIS — N178 Other acute kidney failure: Secondary | ICD-10-CM

## 2017-06-23 LAB — PROTIME-INR
INR: 2.04
Prothrombin Time: 22.9 seconds — ABNORMAL HIGH (ref 11.4–15.2)

## 2017-06-23 LAB — HAPTOGLOBIN: Haptoglobin: 93 mg/dL (ref 34–200)

## 2017-06-23 LAB — COMPREHENSIVE METABOLIC PANEL
ALK PHOS: 613 U/L — AB (ref 38–126)
ALT: 415 U/L — ABNORMAL HIGH (ref 14–54)
ANION GAP: 18 — AB (ref 5–15)
AST: 2117 U/L — ABNORMAL HIGH (ref 15–41)
Albumin: 2.2 g/dL — ABNORMAL LOW (ref 3.5–5.0)
BUN: 42 mg/dL — ABNORMAL HIGH (ref 6–20)
CALCIUM: 6.5 mg/dL — AB (ref 8.9–10.3)
CHLORIDE: 98 mmol/L — AB (ref 101–111)
CO2: 21 mmol/L — AB (ref 22–32)
Creatinine, Ser: 2.96 mg/dL — ABNORMAL HIGH (ref 0.44–1.00)
GFR, EST AFRICAN AMERICAN: 21 mL/min — AB (ref >60)
GFR, EST NON AFRICAN AMERICAN: 18 mL/min — AB (ref >60)
Glucose, Bld: 245 mg/dL — ABNORMAL HIGH (ref 65–99)
Potassium: 4.6 mmol/L (ref 3.5–5.1)
SODIUM: 137 mmol/L (ref 135–145)
Total Bilirubin: 6.4 mg/dL — ABNORMAL HIGH (ref 0.3–1.2)
Total Protein: 6 g/dL — ABNORMAL LOW (ref 6.5–8.1)

## 2017-06-23 LAB — BLOOD GAS, ARTERIAL
ACID-BASE DEFICIT: 6.3 mmol/L — AB (ref 0.0–2.0)
ACID-BASE DEFICIT: 8 mmol/L — AB (ref 0.0–2.0)
Acid-base deficit: 7.5 mmol/L — ABNORMAL HIGH (ref 0.0–2.0)
BICARBONATE: 19.5 mmol/L — AB (ref 20.0–28.0)
BICARBONATE: 20.5 mmol/L (ref 20.0–28.0)
Bicarbonate: 19.4 mmol/L — ABNORMAL LOW (ref 20.0–28.0)
DRAWN BY: 295031
DRAWN BY: 295031
Drawn by: 295031
FIO2: 100
FIO2: 50
LHR: 14 {breaths}/min
MECHVT: 470 mL
MECHVT: 470 mL
O2 CONTENT: 3.5 L/min
O2 SAT: 96 %
O2 SAT: 99.5 %
O2 SAT: 98.5 %
PATIENT TEMPERATURE: 98.6
PATIENT TEMPERATURE: 98.6
PCO2 ART: 45.8 mmHg (ref 32.0–48.0)
PEEP/CPAP: 5 cmH2O
PEEP: 5 cmH2O
PH ART: 7.192 — AB (ref 7.350–7.450)
PO2 ART: 102 mmHg (ref 83.0–108.0)
PO2 ART: 164 mmHg — AB (ref 83.0–108.0)
Patient temperature: 98.6
RATE: 24 resp/min
pCO2 arterial: 41.5 mmHg (ref 32.0–48.0)
pCO2 arterial: 55.6 mmHg — ABNORMAL HIGH (ref 32.0–48.0)
pH, Arterial: 7.293 — ABNORMAL LOW (ref 7.350–7.450)
pH, Arterial: 7.25 — ABNORMAL LOW (ref 7.350–7.450)
pO2, Arterial: 487 mmHg — ABNORMAL HIGH (ref 83.0–108.0)

## 2017-06-23 LAB — CBC
HCT: 40.2 % (ref 36.0–46.0)
HEMOGLOBIN: 12.8 g/dL (ref 12.0–15.0)
MCH: 25.2 pg — AB (ref 26.0–34.0)
MCHC: 31.8 g/dL (ref 30.0–36.0)
MCV: 79.3 fL (ref 78.0–100.0)
Platelets: 190 10*3/uL (ref 150–400)
RBC: 5.07 MIL/uL (ref 3.87–5.11)
WBC: 8.7 10*3/uL (ref 4.0–10.5)

## 2017-06-23 LAB — RENAL FUNCTION PANEL
ANION GAP: 17 — AB (ref 5–15)
Albumin: 2.3 g/dL — ABNORMAL LOW (ref 3.5–5.0)
BUN: 36 mg/dL — ABNORMAL HIGH (ref 6–20)
CALCIUM: 6.3 mg/dL — AB (ref 8.9–10.3)
CO2: 21 mmol/L — AB (ref 22–32)
Chloride: 98 mmol/L — ABNORMAL LOW (ref 101–111)
Creatinine, Ser: 2.46 mg/dL — ABNORMAL HIGH (ref 0.44–1.00)
GFR calc non Af Amer: 23 mL/min — ABNORMAL LOW (ref >60)
GFR, EST AFRICAN AMERICAN: 27 mL/min — AB (ref >60)
Glucose, Bld: 193 mg/dL — ABNORMAL HIGH (ref 65–99)
PHOSPHORUS: 7.9 mg/dL — AB (ref 2.5–4.6)
Potassium: 4.6 mmol/L (ref 3.5–5.1)
SODIUM: 136 mmol/L (ref 135–145)

## 2017-06-23 LAB — GLUCOSE, CAPILLARY
GLUCOSE-CAPILLARY: 189 mg/dL — AB (ref 65–99)
GLUCOSE-CAPILLARY: 165 mg/dL — AB (ref 65–99)
GLUCOSE-CAPILLARY: 165 mg/dL — AB (ref 65–99)
Glucose-Capillary: 281 mg/dL — ABNORMAL HIGH (ref 65–99)

## 2017-06-23 LAB — AMMONIA: Ammonia: 47 umol/L — ABNORMAL HIGH (ref 9–35)

## 2017-06-23 LAB — MAGNESIUM: MAGNESIUM: 2 mg/dL (ref 1.7–2.4)

## 2017-06-23 LAB — PHOSPHORUS: Phosphorus: 10.2 mg/dL — ABNORMAL HIGH (ref 2.5–4.6)

## 2017-06-23 LAB — URIC ACID: URIC ACID, SERUM: 7.7 mg/dL — AB (ref 2.3–6.6)

## 2017-06-23 MED ORDER — DEXAMETHASONE SODIUM PHOSPHATE 4 MG/ML IJ SOLN
8.0000 mg | INTRAMUSCULAR | Status: DC
  Administered 2017-06-23: 8 mg via INTRAVENOUS
  Filled 2017-06-23: qty 2

## 2017-06-23 MED ORDER — INSULIN ASPART 100 UNIT/ML ~~LOC~~ SOLN
0.0000 [IU] | SUBCUTANEOUS | Status: DC
  Administered 2017-06-23: 3 [IU] via SUBCUTANEOUS
  Administered 2017-06-25 (×3): 2 [IU] via SUBCUTANEOUS

## 2017-06-23 MED ORDER — DEXMEDETOMIDINE HCL IN NACL 200 MCG/50ML IV SOLN
0.0000 ug/kg/h | INTRAVENOUS | Status: DC
  Administered 2017-06-23: 0.4 ug/kg/h via INTRAVENOUS
  Administered 2017-06-23: 0.6 ug/kg/h via INTRAVENOUS
  Administered 2017-06-23 (×5): 0.8 ug/kg/h via INTRAVENOUS
  Administered 2017-06-24: 0.9 ug/kg/h via INTRAVENOUS
  Administered 2017-06-24 (×2): 0.8 ug/kg/h via INTRAVENOUS
  Administered 2017-06-24: 0.9 ug/kg/h via INTRAVENOUS
  Filled 2017-06-23 (×8): qty 50
  Filled 2017-06-23: qty 100
  Filled 2017-06-23: qty 50

## 2017-06-23 MED ORDER — PRISMASOL BGK 4/2.5 32-4-2.5 MEQ/L IV SOLN
INTRAVENOUS | Status: DC
  Administered 2017-06-23 – 2017-06-25 (×17): via INTRAVENOUS_CENTRAL
  Filled 2017-06-23 (×23): qty 5000

## 2017-06-23 MED ORDER — PRISMASOL BGK 4/2.5 32-4-2.5 MEQ/L IV SOLN
INTRAVENOUS | Status: DC
  Administered 2017-06-23 – 2017-06-24 (×2): via INTRAVENOUS_CENTRAL
  Filled 2017-06-23 (×5): qty 5000

## 2017-06-23 MED ORDER — SODIUM CHLORIDE 0.9 % IV SOLN
0.0000 ug/min | INTRAVENOUS | Status: DC
  Administered 2017-06-23: 30 ug/min via INTRAVENOUS
  Administered 2017-06-24: 80 ug/min via INTRAVENOUS
  Administered 2017-06-24: 110 ug/min via INTRAVENOUS
  Administered 2017-06-24: 130 ug/min via INTRAVENOUS
  Administered 2017-06-25: 150 ug/min via INTRAVENOUS
  Administered 2017-06-25: 250 ug/min via INTRAVENOUS
  Administered 2017-06-25: 260 ug/min via INTRAVENOUS
  Administered 2017-06-25: 170 ug/min via INTRAVENOUS
  Administered 2017-06-25 (×2): 260 ug/min via INTRAVENOUS
  Administered 2017-06-25: 320 ug/min via INTRAVENOUS
  Administered 2017-06-25: 300 ug/min via INTRAVENOUS
  Administered 2017-06-26: 370 ug/min via INTRAVENOUS
  Administered 2017-06-26 (×7): 400 ug/min via INTRAVENOUS
  Administered 2017-06-26: 340 ug/min via INTRAVENOUS
  Administered 2017-06-26: 400 ug/min via INTRAVENOUS
  Filled 2017-06-23 (×2): qty 4
  Filled 2017-06-23: qty 40
  Filled 2017-06-23: qty 4
  Filled 2017-06-23: qty 40
  Filled 2017-06-23: qty 4
  Filled 2017-06-23 (×2): qty 40
  Filled 2017-06-23: qty 4
  Filled 2017-06-23 (×5): qty 40
  Filled 2017-06-23 (×2): qty 4
  Filled 2017-06-23: qty 40
  Filled 2017-06-23: qty 4
  Filled 2017-06-23 (×4): qty 40
  Filled 2017-06-23: qty 4

## 2017-06-23 MED ORDER — SODIUM CHLORIDE 0.9 % FOR CRRT
INTRAVENOUS_CENTRAL | Status: DC | PRN
  Filled 2017-06-23: qty 1000

## 2017-06-23 MED ORDER — FENTANYL CITRATE (PF) 100 MCG/2ML IJ SOLN
100.0000 ug | INTRAMUSCULAR | Status: DC | PRN
  Administered 2017-06-23 (×2): 100 ug via INTRAVENOUS
  Filled 2017-06-23: qty 2

## 2017-06-23 MED ORDER — ETOMIDATE 2 MG/ML IV SOLN
20.0000 mg | Freq: Once | INTRAVENOUS | Status: AC
  Administered 2017-06-23: 20 mg via INTRAVENOUS

## 2017-06-23 MED ORDER — PANTOPRAZOLE SODIUM 40 MG IV SOLR
40.0000 mg | INTRAVENOUS | Status: DC
  Administered 2017-06-23 – 2017-06-25 (×3): 40 mg via INTRAVENOUS
  Filled 2017-06-23 (×3): qty 40

## 2017-06-23 MED ORDER — CHLORHEXIDINE GLUCONATE 0.12 % MT SOLN
15.0000 mL | Freq: Two times a day (BID) | OROMUCOSAL | Status: DC
  Administered 2017-06-23 – 2017-06-26 (×7): 15 mL via OROMUCOSAL
  Filled 2017-06-23 (×3): qty 15

## 2017-06-23 MED ORDER — SODIUM CHLORIDE 0.9 % IV SOLN
INTRAVENOUS | Status: DC
  Administered 2017-06-23 – 2017-06-24 (×3): via INTRAVENOUS

## 2017-06-23 MED ORDER — PRISMASOL BGK 4/2.5 32-4-2.5 MEQ/L IV SOLN
INTRAVENOUS | Status: DC
Start: 2017-06-23 — End: 2017-06-25
  Administered 2017-06-23 – 2017-06-25 (×3): via INTRAVENOUS_CENTRAL
  Filled 2017-06-23 (×7): qty 5000

## 2017-06-23 MED ORDER — NOREPINEPHRINE BITARTRATE 1 MG/ML IV SOLN
0.0000 ug/min | INTRAVENOUS | Status: DC
  Administered 2017-06-23: 35 ug/min via INTRAVENOUS
  Administered 2017-06-23: 25 ug/min via INTRAVENOUS
  Administered 2017-06-24: 30 ug/min via INTRAVENOUS
  Administered 2017-06-24: 34.987 ug/min via INTRAVENOUS
  Administered 2017-06-25 (×3): 35 ug/min via INTRAVENOUS
  Administered 2017-06-26 (×3): 40 ug/min via INTRAVENOUS
  Filled 2017-06-23 (×11): qty 16

## 2017-06-23 MED ORDER — SODIUM BICARBONATE 8.4 % IV SOLN
INTRAVENOUS | Status: AC
  Administered 2017-06-23: 50 meq
  Filled 2017-06-23: qty 50

## 2017-06-23 MED ORDER — VASOPRESSIN 20 UNIT/ML IV SOLN
0.0300 [IU]/min | INTRAVENOUS | Status: DC
  Administered 2017-06-23 – 2017-06-26 (×4): 0.03 [IU]/min via INTRAVENOUS
  Filled 2017-06-23 (×4): qty 2

## 2017-06-23 MED ORDER — BENZOCAINE (TOPICAL) 20 % EX AERO
INHALATION_SPRAY | Freq: Four times a day (QID) | CUTANEOUS | Status: DC | PRN
  Filled 2017-06-23: qty 57

## 2017-06-23 MED ORDER — MIDAZOLAM HCL 2 MG/2ML IJ SOLN
INTRAMUSCULAR | Status: AC
  Administered 2017-06-23: 2 mg
  Filled 2017-06-23: qty 2

## 2017-06-23 MED ORDER — ORAL CARE MOUTH RINSE
15.0000 mL | Freq: Two times a day (BID) | OROMUCOSAL | Status: DC
  Administered 2017-06-23 – 2017-06-26 (×7): 15 mL via OROMUCOSAL

## 2017-06-23 MED ORDER — ROCURONIUM BROMIDE 50 MG/5ML IV SOLN
50.0000 mg | Freq: Once | INTRAVENOUS | Status: AC
  Administered 2017-06-23: 50 mg via INTRAVENOUS

## 2017-06-23 MED ORDER — FENTANYL CITRATE (PF) 100 MCG/2ML IJ SOLN
INTRAMUSCULAR | Status: AC
  Administered 2017-06-23: 100 ug
  Filled 2017-06-23: qty 2

## 2017-06-23 MED ORDER — FENTANYL CITRATE (PF) 100 MCG/2ML IJ SOLN
100.0000 ug | INTRAMUSCULAR | Status: DC | PRN
  Administered 2017-06-24 – 2017-06-25 (×2): 25 ug via INTRAVENOUS
  Filled 2017-06-23: qty 2

## 2017-06-23 MED ORDER — HEPARIN SODIUM (PORCINE) 1000 UNIT/ML IJ SOLN
1000.0000 [IU] | INTRAMUSCULAR | Status: DC | PRN
  Filled 2017-06-23: qty 6

## 2017-06-23 MED ORDER — SODIUM CHLORIDE 0.9 % IV SOLN: INTRAVENOUS | Status: DC | PRN

## 2017-06-23 MED ORDER — ALTEPLASE 2 MG IJ SOLR: 2.0000 mg | Freq: Once | INTRAMUSCULAR | Status: DC | PRN

## 2017-06-23 MED ORDER — HEPARIN SODIUM (PORCINE) 1000 UNIT/ML DIALYSIS: 1000.0000 [IU] | INTRAMUSCULAR | Status: DC | PRN

## 2017-06-23 NOTE — Progress Notes (Signed)
Patient is intubated, on pressor, needing CRRT, care transition to ICU, hospitalist sign off.

## 2017-06-23 NOTE — Progress Notes (Signed)
   06/23/17 1000  Clinical Encounter Type  Visited With Patient and family together  Visit Type Follow-up;Psychological support;Spiritual support  Referral From Nurse  Consult/Referral To Chaplain  Spiritual Encounters  Spiritual Needs Prayer;Emotional;Other (Comment) (Pastoral Conversation/Support)  Stress Factors  Patient Stress Factors Not reviewed  Family Stress Factors Health changes;Major life changes;Other (Comment) (Grief and Fear)   I followed up with the patient and her family. The patient had been intubated and the family were dealing with fear over the patient's condition.  I spoke with the patient's mother and father in the waiting room. They were very tearful and anxious over the patient having to be intubated. They are extremely faithful people and draw their strength from their [place]. They requested prayer, and we prayed. The patient's mother requested that I go in and pray with the patient.  I prayed with the patient and the patient's nurses.  The family requires follow up and continued support while the patient is in the hospital.  Please, contact Spiritual Care for further assistance.   [place] M.Div.

## 2017-06-23 NOTE — Procedures (Signed)
Intubation Procedure Note Patricia Mcclain [id number] Jun 06, 1976  Procedure: Intubation Indications: Respiratory insufficiency  Procedure Details Consent: Risks of procedure as well as the alternatives and risks of each were explained to the (patient/caregiver).  Consent for procedure obtained. Time Out: Verified patient identification, verified procedure, site/side was marked, verified correct patient position, special equipment/implants available, medications/allergies/relevent history reviewed, required imaging and test results available.  Performed  Maximum sterile technique was used including antiseptics, gloves, hand hygiene and mask.  MAC    Evaluation Hemodynamic Status: BP stable throughout; O2 sats: stable throughout Patient's Current Condition: stable Complications: No apparent complications Patient did tolerate procedure well. Chest X-ray ordered to verify placement.  CXR: pending.   YACOUB,WESAM 06/23/2017

## 2017-06-23 NOTE — Progress Notes (Addendum)
PULMONARY / CRITICAL CARE MEDICINE   Name: Patricia Mcclain MRN: [id number] DOB: [birth year]    ADMISSION DATE:  07/04/2017 CONSULTATION DATE:  11/14  REFERRING MD:  Dr. Maryland Pink, Darnell [place]   CHIEF COMPLAINT:  Encephalopathy  HISTORY OF PRESENT ILLNESS:   41 year old female with past medical history as below, which is significant for insulin-dependent diabetes, hypothyroidism, congenital absence of one kidney, and hypertension.  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  She was hospitalized for further evaluation and she did receive 1 dose of Rasburicase on November 12 after initiation of IV steroids.  Hospital course since complicated by tumor lysis syndrome in the constellation of worsening renal function, hyperkalemia, increased uric acid level, and worsening LFTs.  11/14 she became encephalopathic after receiving pain medication and transfer to the ICU was requested.  SUBJECTIVE:  Clinically looks worse, remains in shock, lethargic  VITAL SIGNS: BP (!) 79/55   Pulse 93   Temp 99.5 F (37.5 C)   Resp 15   Ht 5' 6"  (1.676 m)   Wt 263 lb 7.2 oz (119.5 kg)   LMP 06/07/2017   SpO2 97%   BMI 42.52 kg/m   HEMODYNAMICS:    VENTILATOR SETTINGS:    INTAKE / OUTPUT: I/O last 3 completed shifts: In: 1848.4 [I.V.:1848.4] Out: 73 [Urine:680; Emesis/NG output:1; Stool:1]  PHYSICAL EXAMINATION: General: 41 year old African-American female, lethargic, will open her eyes and communicate however very weak HEENT: Normocephalic atraumatic mucous membranes are dry no neck vein distention Pulmonary: Diminished throughout some accessory muscle use Cardiac: Regular rate and rhythm without murmur rub or gallop Abdomen: Distended, painful to palpation, hypoactive bowel sounds, no organomegaly Extremities: Warm, dry, weak pulses, dependent edema. Neuro: Opens eyes, oriented x3, diffusely weak LABS:  BMET Recent Labs  Lab 06/22/17 1112 06/22/17 1315  06/23/17 0416  NA 135 135 137  K 5.8* 5.9* 4.6  CL 100* 101 98*  CO2 19* 17* 21*  BUN 32* 33* 42*  CREATININE 2.30* 2.40* 2.96*  GLUCOSE 120* 123* 245*    Electrolytes Recent Labs  Lab 06/22/17 1112 06/22/17 1315 06/23/17 0416  CALCIUM 8.1* 7.9* 6.5*  MG  --   --  2.0  PHOS 10.5*  --  10.2*    CBC Recent Labs  Lab 06/21/17 0359 06/22/17 0810 06/23/17 0416  WBC 10.1 10.7* 8.7  HGB 12.7 13.2 12.8  HCT 40.5 41.6 40.2  PLT 168 196 190    Coag's Recent Labs  Lab 06/21/17 0359 06/22/17 0810  APTT  --  38*  INR 1.11 1.33    Sepsis Markers Recent Labs  Lab 06/22/17 0934 06/22/17 1500  LATICACIDVEN 3.9* 4.6*    ABG Recent Labs  Lab 06/22/17 0942  PHART 7.245*  PCO2ART 45.4  PO2ART 74.5*    Liver Enzymes Recent Labs  Lab 06/22/17 0810 06/22/17 1112 06/23/17 0416  AST 2,245* 2,347* 2,117*  ALT 446* 461* 415*  ALKPHOS 409* 437* 613*  BILITOT 6.0* 5.8* 6.4*  ALBUMIN 2.5* 2.4* 2.2*    Cardiac Enzymes No results for input(s): TROPONINI, PROBNP in the last 168 hours.  Glucose Recent Labs  Lab 06/21/17 2038 06/22/17 0633 06/22/17 0740 06/22/17 1246 06/22/17 1805 06/23/17 0752  GLUCAP 127* 90 99 129* 174* 281*    Imaging US Renal  Result Date: 06/22/2017 CLINICAL DATA:  Acute renal failure. EXAM: RENAL / URINARY TRACT ULTRASOUND COMPLETE COMPARISON:  CT abdomen pelvis dated June 06, 2017. 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 Length: 14.3 cm. Echogenicity within normal limits. No mass or hydronephrosis visualized. Mild prominence of the collecting system in the inferior pole, similar to prior CT. Left Kidney: Absent kidney in the left renal fossa. Bladder: Appears normal for degree of bladder distention. IMPRESSION: Cross fused renal ectopia.  No acute abnormality. Electronically Signed   By: Titus Dubin M.D.   On: 06/22/2017 12:49   Dg Chest Port 1 View  Result Date: 06/22/2017 CLINICAL DATA:   Acute pulmonary edema EXAM: PORTABLE CHEST 1 VIEW COMPARISON:  PA and lateral chest x-ray of May 13, 2017 FINDINGS: The lungs are well-expanded. There is no focal infiltrate. There is no pleural effusion. The heart and pulmonary vascularity are normal. The mediastinum is normal in width. The power port catheter tip projects over the distal third of the SVC. IMPRESSION: There is no acute cardiopulmonary abnormality. Electronically Signed   By: David  Martinique M.D.   On: 06/22/2017 10:37     STUDIES:  11/5 Liver biopsy >>> 11/13 Bone marrow biopsy >>>  CULTURES:   ANTIBIOTICS:   SIGNIFICANT EVENTS: 11/12 initiation of IV steroids 11/13: Rasburicase  Administered on 11/12 and 11/14 11/13 port placed 11/14 tumor lysis syndrome suspected 11/15 encephalopathic, transfer to ICU  LINES/TUBES: Port 11/13 >  DISCUSSION: 41 year old female with recent diagnosis of B-cell lymphoma admitted for further workup on the 12th.  Course has been complicated by tumor lysis syndrome after administration of systemic steroids and associated hepatic and renal insufficiency.  She has been given Dilaudid on the morning of 11/14 for pain and became lethargic.  She was transferred to the ICU in the setting. As of 11/15: She is now in circulatory shock, her mental status is still depressed however she is oriented.  I am worried that ongoing acidosis is contributing to her circulatory failure.  Her acid-base is about the same by blood chemistry, her potassium is stable, however I think she is at fairly high risk for needing CRRT in the near future  ASSESSMENT / PLAN:  PULMONARY A: Hypoxia in setting of atelectasis and narcotics -Concerned about hypoventilation as well P:   Arterial blood gas Minimized narcotics May require intubation if hypercarbic  CARDIOVASCULAR A:  Circulatory shock.  Has been adequately volume resuscitated, no evidence of infection.  Wonder if ongoing shock is a consequence of  acidosis and cardiac suppression  History HTN  P:  Holding antihypertensives Continue bicarbonate replacement Stat ABG Continue Levophed for mean arterial pressure goal of greater than 65 Echocardiogram  RENAL A:   Acute kidney injury -  likely secondary to TLS; creatinine climbing Hyperkalemia-improved after Kayexalate Metabolic acidosis in the setting of tumor lysis syndrome and acute kidney failure -Bicarbonate improved with bicarbonate resuscitation and volume -Currently no indication for emergent dialysis however if continues to decline we may yet be heading that way  P:   Continue bicarbonate infusion Maintain mean arterial pressure greater than 65 Renal dose medications Follow-up chemistry this afternoon Further recs per nephrology  GASTROINTESTINAL A:   Transaminitis - likely secondary to TLS; this is improved some Aspiration risk  P:   N.p.o. Continue IV fluids Repeat LFTs in a.m.  HEMATOLOGIC/Oncologic A:   Diffuse large B-cell lymphoma. Recent Dx. Staging incomplete;  got 2 doses of Decadron, complicated by Tumor lysis syndrome has received 2 doses of Rasburicase   P:  Await bone marrow biopsy Further recs per oncology Follow-up CBC in a.m.  INFECTIOUS A:   No acute issues  P:  Trend fever curve  ENDOCRINE A:   IDDM Hypothyroid   P:   Continuing Synthroid Sliding scale insulin protocol  NEUROLOGIC A:   Acute metabolic vs toxic encephalopathy  P:   Continue low-dose as needed analgesia Treat acidosis Checking ammonia level  FAMILY  - Updates: Patient and mother update din ICU 11/14  - Inter-disciplinary family meet or Palliative Care meeting due by:  11/19  My critical care time 40 minutes Erick Colace ACNP-BC [place] Pager # [phone] OR # [phone] if no answer  Attending Note:  41 year old female with recent diagnosis of lymphoma that got 2 rounds of steroids then developed tumor lysis syndrome  with associated renal failure and liver dysfunction.  Patient was transferred to the ICU for acidosis, hypotension and altered mental status.  Patient this AM is not doing well on exam with clear lungs and a distended abdomen.  I reviewed CXR myself, no acute disease noted.  Discussed with Dr. Jonnie Finner from renal and Dr. Claretha Cooper from H/O.  Plan is to proceed with intubation.  Full vent support.  Place HD catheter and start CRRT.  Continue steroids.  Once stable will start chemo.  Mother notified bedside that this is a last ditch effort and she is agreeable with plan.  Will proceed with procedures now.  The patient is critically ill with multiple organ systems failure and requires high complexity decision making for assessment and support, frequent evaluation and titration of therapies, application of advanced monitoring technologies and extensive interpretation of multiple databases.   Critical Care Time devoted to patient care services described in this note is  35  Minutes. This time reflects time of care of this signee Dr Jennet Maduro. This critical care time does not reflect procedure time, or teaching time or supervisory time of PA/NP/Med student/Med Resident etc but could involve care discussion time.  Rush Farmer, M.D. [facility] Pulmonary/Critical Care Medicine. Pager: [phone]. After hours pager: [phone].

## 2017-06-23 NOTE — Progress Notes (Signed)
Patricia Mcclain   DOB:1975/11/05   WC#:[id number]    Subjective: The patient has altered mental status, appeared somewhat sedated.  She appeared to wake up intermittently and grimace in pain on abdominal exam.  Noted progressive abdominal distention and unstable vital signs.  Her mother is present by the bedside  Objective:  Vitals:   06/23/17 0400 06/23/17 0700  BP: (!) 84/47 (!) 79/55  Pulse: 92 93  Resp: 14 15  Temp: 99.3 F (37.4 C) 99.5 F (37.5 C)  SpO2: 97% 97%     Intake/Output Summary (Last 24 hours) at 06/23/2017 0912 Last data filed at 06/23/2017 2353 Gross per 24 hour  Intake 1848.39 ml  Output 82 ml  Net 1766.39 ml    GENERAL: Not alert SKIN: skin color, texture, turgor are normal, no rashes or significant lesions EYES: normal, Conjunctiva are jaundiced LUNGS: clear to auscultation and percussion with normal breathing effort HEART: regular rate & rhythm and no murmurs and no lower extremity edema ABDOMEN:abdomen soft, distended   Labs:  Lab Results  Component Value Date   WBC 8.7 06/23/2017   HGB 12.8 06/23/2017   HCT 40.2 06/23/2017   MCV 79.3 06/23/2017   PLT 190 06/23/2017   NEUTROABS 8.0 (H) 07/20/2017    Lab Results  Component Value Date   NA 137 06/23/2017   K 4.6 06/23/2017   CL 98 (L) 06/23/2017   CO2 21 (L) 06/23/2017    Studies:  US Renal  Result Date: Jul 20, 2017 CLINICAL DATA:  Acute renal failure. EXAM: RENAL / URINARY TRACT ULTRASOUND COMPLETE COMPARISON:  CT abdomen pelvis dated June 06, 2017. 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 Length: 14.3 cm. Echogenicity within normal limits. No mass or hydronephrosis visualized. Mild prominence of the collecting system in the inferior pole, similar to prior CT. Left Kidney: Absent kidney in the left renal fossa. Bladder: Appears normal for degree of bladder distention. IMPRESSION: Cross fused renal ectopia.  No acute abnormality. Electronically Signed   By:  Titus Dubin M.D.   On: 2017/07/20 12:49   Ir US Guide Vasc Access Right  Result Date: 06/21/2017 INDICATION: History of lymphoma. In need of durable intravenous access for chemotherapy administration. EXAM: IMPLANTED PORT A CATH PLACEMENT WITH ULTRASOUND AND FLUOROSCOPIC GUIDANCE COMPARISON:  Chest CT - 06/08/2017 MEDICATIONS: Ancef 2 gm IV; The antibiotic was administered within an appropriate time interval prior to skin puncture. ANESTHESIA/SEDATION: Moderate (conscious) sedation was employed during this procedure. A total of Versed 1 mg was administered intravenously. Moderate Sedation Time: 23 minutes. The patient's level of consciousness and vital signs were monitored continuously by radiology nursing throughout the procedure under my direct supervision. CONTRAST:  None FLUOROSCOPY TIME:  18 seconds (8 mGy) COMPLICATIONS: None immediate. PROCEDURE: The procedure, risks, benefits, and alternatives were explained to the patient. Questions regarding the procedure were encouraged and answered. The patient understands and consents to the procedure. The right neck and chest were prepped with chlorhexidine in a sterile fashion, and a sterile drape was applied covering the operative field. Maximum barrier sterile technique with sterile gowns and gloves were used for the procedure. A timeout was performed prior to the initiation of the procedure. Local anesthesia was provided with 1% lidocaine with epinephrine. After creating a small venotomy incision, a micropuncture kit was utilized to access the internal jugular vein. Real-time ultrasound guidance was utilized for vascular access including the acquisition of a permanent ultrasound image documenting patency of the accessed vessel. The  microwire was utilized to measure appropriate catheter length. A subcutaneous port pocket was then created along the upper chest wall utilizing a combination of sharp and blunt dissection. The pocket was irrigated with sterile  saline. A single lumen ISP power injectable port was chosen for placement. The 8 Fr catheter was tunneled from the port pocket site to the venotomy incision. The port was placed in the pocket. The external catheter was trimmed to appropriate length. At the venotomy, an 8 Fr peel-away sheath was placed over a guidewire under fluoroscopic guidance. The catheter was then placed through the sheath and the sheath was removed. Final catheter positioning was confirmed and documented with a fluoroscopic spot radiograph. The port was accessed with a Huber needle, aspirated and flushed. The venotomy site was closed with an interrupted 4-0 Vicryl suture. The port pocket incision was closed with interrupted 2-0 Vicryl suture and the skin was opposed with a running subcuticular 4-0 Vicryl suture. Dermabond and Steri-strips were applied to both incisions. The port was left accessed as per recommendations from the providing clinical team. Dressings were placed. The patient tolerated the procedure well without immediate post procedural complication. FINDINGS: After catheter placement, the tip lies within the superior cavoatrial junction. The catheter aspirates and flushes normally and is ready for immediate use. IMPRESSION: Successful placement of a right internal jugular approach power injectable Port-A-Cath. The Port a Catheter was left accessed for immediate use. Electronically Signed   By: Sandi Mariscal M.D.   On: 06/21/2017 10:03   Ct Biopsy  Result Date: 06/21/2017 INDICATION: History of lymphoma. Please perform CT-guided bone marrow biopsy for tissue diagnostic purposes. EXAM: CT-GUIDED BONE MARROW BIOPSY AND ASPIRATION MEDICATIONS: None ANESTHESIA/SEDATION: Fentanyl 100 mcg IV; Versed 2 mg IV Sedation Time: 10 minutes; The patient was continuously monitored during the procedure by the interventional radiology nurse under my direct supervision. COMPLICATIONS: None immediate. PROCEDURE: Informed consent was obtained from  the patient following an explanation of the procedure, risks, benefits and alternatives. The patient understands, agrees and consents for the procedure. All questions were addressed. A time out was performed prior to the initiation of the procedure. The patient was positioned prone and non-contrast localization CT was performed of the pelvis to demonstrate the iliac marrow spaces. The operative site was prepped and draped in the usual sterile fashion. Under sterile conditions and local anesthesia, a 22 gauge spinal needle was utilized for procedural planning. Next, an 11 gauge coaxial bone biopsy needle was advanced into the left iliac marrow space. Needle position was confirmed with CT imaging. Initially, bone marrow aspiration was performed. Next, a bone marrow biopsy was obtained with the 11 gauge outer bone marrow device. Samples were prepared with the cytotechnologist and deemed adequate. The needle was removed intact. Hemostasis was obtained with compression and a dressing was placed. The patient tolerated the procedure well without immediate post procedural complication. IMPRESSION: Successful CT guided left iliac bone marrow aspiration and core biopsy. Electronically Signed   By: Sandi Mariscal M.D.   On: 06/21/2017 10:22   Dg Chest Port 1 View  Result Date: 06/22/2017 CLINICAL DATA:  Acute pulmonary edema EXAM: PORTABLE CHEST 1 VIEW COMPARISON:  PA and lateral chest x-ray of May 13, 2017 FINDINGS: The lungs are well-expanded. There is no focal infiltrate. There is no pleural effusion. The heart and pulmonary vascularity are normal. The mediastinum is normal in width. The power port catheter tip projects over the distal third of the SVC. IMPRESSION: There is no acute cardiopulmonary abnormality.  Electronically Signed   By: David  Martinique M.D.   On: 06/22/2017 10:37   Ct Bone Marrow Biopsy & Aspiration  Result Date: 06/21/2017 INDICATION: History of lymphoma. Please perform CT-guided bone marrow  biopsy for tissue diagnostic purposes. EXAM: CT-GUIDED BONE MARROW BIOPSY AND ASPIRATION MEDICATIONS: None ANESTHESIA/SEDATION: Fentanyl 100 mcg IV; Versed 2 mg IV Sedation Time: 10 minutes; The patient was continuously monitored during the procedure by the interventional radiology nurse under my direct supervision. COMPLICATIONS: None immediate. PROCEDURE: Informed consent was obtained from the patient following an explanation of the procedure, risks, benefits and alternatives. The patient understands, agrees and consents for the procedure. All questions were addressed. A time out was performed prior to the initiation of the procedure. The patient was positioned prone and non-contrast localization CT was performed of the pelvis to demonstrate the iliac marrow spaces. The operative site was prepped and draped in the usual sterile fashion. Under sterile conditions and local anesthesia, a 22 gauge spinal needle was utilized for procedural planning. Next, an 11 gauge coaxial bone biopsy needle was advanced into the left iliac marrow space. Needle position was confirmed with CT imaging. Initially, bone marrow aspiration was performed. Next, a bone marrow biopsy was obtained with the 11 gauge outer bone marrow device. Samples were prepared with the cytotechnologist and deemed adequate. The needle was removed intact. Hemostasis was obtained with compression and a dressing was placed. The patient tolerated the procedure well without immediate post procedural complication. IMPRESSION: Successful CT guided left iliac bone marrow aspiration and core biopsy. Electronically Signed   By: Sandi Mariscal M.D.   On: 06/21/2017 10:22   Ir Fluoro Guide Port Insertion Right  Result Date: 06/21/2017 INDICATION: History of lymphoma. In need of durable intravenous access for chemotherapy administration. EXAM: IMPLANTED PORT A CATH PLACEMENT WITH ULTRASOUND AND FLUOROSCOPIC GUIDANCE COMPARISON:  Chest CT - 06/08/2017 MEDICATIONS: Ancef  2 gm IV; The antibiotic was administered within an appropriate time interval prior to skin puncture. ANESTHESIA/SEDATION: Moderate (conscious) sedation was employed during this procedure. A total of Versed 1 mg was administered intravenously. Moderate Sedation Time: 23 minutes. The patient's level of consciousness and vital signs were monitored continuously by radiology nursing throughout the procedure under my direct supervision. CONTRAST:  None FLUOROSCOPY TIME:  18 seconds (8 mGy) COMPLICATIONS: None immediate. PROCEDURE: The procedure, risks, benefits, and alternatives were explained to the patient. Questions regarding the procedure were encouraged and answered. The patient understands and consents to the procedure. The right neck and chest were prepped with chlorhexidine in a sterile fashion, and a sterile drape was applied covering the operative field. Maximum barrier sterile technique with sterile gowns and gloves were used for the procedure. A timeout was performed prior to the initiation of the procedure. Local anesthesia was provided with 1% lidocaine with epinephrine. After creating a small venotomy incision, a micropuncture kit was utilized to access the internal jugular vein. Real-time ultrasound guidance was utilized for vascular access including the acquisition of a permanent ultrasound image documenting patency of the accessed vessel. The microwire was utilized to measure appropriate catheter length. A subcutaneous port pocket was then created along the upper chest wall utilizing a combination of sharp and blunt dissection. The pocket was irrigated with sterile saline. A single lumen ISP power injectable port was chosen for placement. The 8 Fr catheter was tunneled from the port pocket site to the venotomy incision. The port was placed in the pocket. The external catheter was trimmed to appropriate length. At the  venotomy, an 8 Fr peel-away sheath was placed over a guidewire under fluoroscopic  guidance. The catheter was then placed through the sheath and the sheath was removed. Final catheter positioning was confirmed and documented with a fluoroscopic spot radiograph. The port was accessed with a Huber needle, aspirated and flushed. The venotomy site was closed with an interrupted 4-0 Vicryl suture. The port pocket incision was closed with interrupted 2-0 Vicryl suture and the skin was opposed with a running subcuticular 4-0 Vicryl suture. Dermabond and Steri-strips were applied to both incisions. The port was left accessed as per recommendations from the providing clinical team. Dressings were placed. The patient tolerated the procedure well without immediate post procedural complication. FINDINGS: After catheter placement, the tip lies within the superior cavoatrial junction. The catheter aspirates and flushes normally and is ready for immediate use. IMPRESSION: Successful placement of a right internal jugular approach power injectable Port-A-Cath. The Port a Catheter was left accessed for immediate use. Electronically Signed   By: Sandi Mariscal M.D.   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 She should be able to tolerate IV rituximab regardless of liver or kidney dysfunction  Cancer associated pain Stable on Dilaudid  Jaundice, progressive  liver failure She has extensive liver involvement with signs of liver failure Appreciate close follow-up by GI service  Solitary kidney, worsening renal failure, tumor lysis syndrome I would defer to nephrologist for further management.  The patient will need to be started on hemodialysis  Insulin dependent diabetes Continue insulin sliding scale  CODE STATUS Full code  Discharge planning and goals of care  The patient's mother is now the dedicated medical healthcare power of attorney She understood that the patient is very sick and would likely die even with aggressive supportive care if her disease continued to progress  I will continue to monitor her status closely   Heath Lark, MD 06/23/2017  9:12 AM

## 2017-06-23 NOTE — Progress Notes (Signed)
RN Judson Roch requested Levophed drip be increased to quadruple strength. RPh modified order and RN acknowledged change in Alaris pump settings with quad strength drip.  Reuel Boom, PharmD, BCPS Pager: [phone] 06/23/2017, 10:52 AM

## 2017-06-23 NOTE — Progress Notes (Signed)
Nutrition Follow-up  DOCUMENTATION CODES:   Obesity unspecified, Non-severe (moderate) malnutrition in context of acute illness/injury  INTERVENTION:  - Will monitor medical course and, if warranted, provide TF recommendations at follow-up.  NUTRITION DIAGNOSIS:   Moderate Malnutrition related to acute illness(new diagnosis of DLBCL) as evidenced by percent weight loss, energy intake < 75% for > 7 days. -ongoing  GOAL:   Provide needs based on ASPEN/SCCM guidelines -unable to meet at this time  MONITOR:   Vent status, Weight trends, Labs, I & O's  REASON FOR ASSESSMENT:   Ventilator  ASSESSMENT:   41 y.o. female is admitted for high dose, inpatient chemotherapy. Pt with history of Diffuse large B-cell lymphoma, progressive decline in performance status with signs of liver failure, solitary kidney.  11/15 Pt with hypoxia and concern for hypoventilation this AM. She was subsequently intubated shortly after 9 AM today d/t respiratory insufficiency. CV HD cath placed and pt to start on CRRT today; Nephrology following and states "acute renal failure due to tumor lysis syndrome." Pt was recently dx with B cell lymphoma. Estimated nutrition needs updated based on intubation. NGT in place to R nare and CXR report states that tip of tube is in the stomach.   Patient is currently intubated on ventilator support MV: 10 L/min Temp (24hrs), Avg:98.3 F (36.8 C), Min:97.5 F (36.4 C), Max:99.5 F (37.5 C) Propofol: none BP: 87/64 and MAP: 71  Medications reviewed; 100 mg Colace BID, sliding scale Novolog, 200 mg Synthroid/day, daily multivitamin with minerals, 30 g oral Katexalate x5 doses started yesterday.  Labs reviewed; CBGs: 281 mg/dL this AM, Cl: 98 mmol/L, BUN: 42 mg/dL, creatinine: 2.96 mg/dL, Ca: 6.5 mg/dL, Phos: 10.2 mg/dL, Alk Phos elevated, LFTs elevated, ammonia: 47 umol/L.   IVF: NS @ 100 mL/hr.  Drips: Precedex @ 0.5 mcg/kg/hr, Levo @ 35 mcg/min.    11/13 - Pt in  room with mother at bedside.  - Visibly distended stomach.  - Poor appetite for a few months d/t N/V.  - She was not eating anything during this time period. - Today she is feeling hungry and ready to eat.  - Diet was advanced to regular diet.  - She was drinking Boost drinks the past 2 weeks PTA. RD to order Boost Plus.  - Prior to developing N/V a few months ago, she was "loving to eat" with good appetite. - Pt states she had 1/2 sub sandwich yesterday and tolerated this. - Per chart review, pt has lost 19 lb since 10/2 (7% wt loss x 1.5 months, significant for time frame).     Diet Order:  No diet orders on file  EDUCATION NEEDS:   No education needs have been identified at this time  Skin:  Skin Assessment: Reviewed RN Assessment  Last BM:  11/12  Height:   Ht Readings from Last 1 Encounters:  06/23/17 5' 6"  (1.676 m)    Weight:   Wt Readings from Last 1 Encounters:  06/22/17 263 lb 7.2 oz (119.5 kg)    Ideal Body Weight:  59.1 kg  BMI:  Body mass index is 42.52 kg/m.  Estimated Nutritional Needs:   Kcal:  6811-5726 (22-25 kcalkg IBW)  Protein:  >/= 160 grams (2.7 grams/kg IBW)  Fluid:  2.1L/day     Jarome Matin, MS, RD, LDN, CNSC Inpatient Clinical Dietitian Pager # [phone] After hours/weekend pager # [phone]

## 2017-06-23 NOTE — Progress Notes (Addendum)
[facility] Progress Note  CC:  Elevated LFT's  Subjective:  Patient was being intubated and having dialysis catheter placed when I attempted to see the patient this morning.  Objective:  Vital signs in last 24 hours: Temp:  [97.1 F (36.2 C)-99.5 F (37.5 C)] 99.5 F (37.5 C) (11/15 0700) Pulse Rate:  [77-93] 93 (11/15 0700) Resp:  [9-26] 15 (11/15 0700) BP: (68-110)/(38-66) 79/55 (11/15 0700) SpO2:  [89 %-100 %] 97 % (11/15 0700) Weight:  [263 lb 7.2 oz (119.5 kg)] 263 lb 7.2 oz (119.5 kg) (11/14 1000) Last BM Date: 06/25/2017 General:   Alert,  Well-developed,    in NAD Heart:  Regular rate and rhythm; no murmurs Pulm: Abdomen:  Soft, nontender and nondistended. Normal bowel sounds, without guarding, and without rebound.   Extremities:  Without edema. Neurologic:  Alert and  oriented x4;  grossly normal neurologically. Psych:  Alert and cooperative. Normal mood and affect.  Intake/Output from previous day: 11/14 0701 - 11/15 0700 In: 1848.4 [I.V.:1848.4] Out: 82 [Urine:80; Emesis/NG output:1; Stool:1]   Lab Results: Recent Labs    06/21/17 0359 06/22/17 0810 06/23/17 0416  WBC 10.1 10.7* 8.7  HGB 12.7 13.2 12.8  HCT 40.5 41.6 40.2  PLT 168 196 190   BMET Recent Labs    06/22/17 1112 06/22/17 1315 06/23/17 0416  NA 135 135 137  K 5.8* 5.9* 4.6  CL 100* 101 98*  CO2 19* 17* 21*  GLUCOSE 120* 123* 245*  BUN 32* 33* 42*  CREATININE 2.30* 2.40* 2.96*  CALCIUM 8.1* 7.9* 6.5*   LFT Recent Labs    06/23/17 0416  PROT 6.0*  ALBUMIN 2.2*  AST 2,117*  ALT 415*  ALKPHOS 613*  BILITOT 6.4*   PT/INR Recent Labs    06/21/17 0359 06/22/17 0810  LABPROT 14.2 16.4*  INR 1.11 1.33   Hepatitis Panel Recent Labs    06/10/2017 1233  HEPBSAG Negative  HEPBIGM Negative    US Renal  Result Date: 06/22/2017 CLINICAL DATA:  Acute renal failure. EXAM: RENAL / URINARY TRACT ULTRASOUND COMPLETE COMPARISON:  CT abdomen pelvis dated June 06, 2017. 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 Length: 14.3 cm. Echogenicity within normal limits. No mass or hydronephrosis visualized. Mild prominence of the collecting system in the inferior pole, similar to prior CT. Left Kidney: Absent kidney in the left renal fossa. Bladder: Appears normal for degree of bladder distention. IMPRESSION: Cross fused renal ectopia.  No acute abnormality. Electronically Signed   By: Titus Dubin M.D.   On: 06/22/2017 12:49   Ir US Guide Vasc Access Right  Result Date: 06/21/2017 INDICATION: History of lymphoma. In need of durable intravenous access for chemotherapy administration. EXAM: IMPLANTED PORT A CATH PLACEMENT WITH ULTRASOUND AND FLUOROSCOPIC GUIDANCE COMPARISON:  Chest CT - 06/08/2017 MEDICATIONS: Ancef 2 gm IV; The antibiotic was administered within an appropriate time interval prior to skin puncture. ANESTHESIA/SEDATION: Moderate (conscious) sedation was employed during this procedure. A total of Versed 1 mg was administered intravenously. Moderate Sedation Time: 23 minutes. The patient's level of consciousness and vital signs were monitored continuously by radiology nursing throughout the procedure under my direct supervision. CONTRAST:  None FLUOROSCOPY TIME:  18 seconds (8 mGy) COMPLICATIONS: None immediate. PROCEDURE: The procedure, risks, benefits, and alternatives were explained to the patient. Questions regarding the procedure were encouraged and answered. The patient understands and consents to the procedure. The right neck and chest were prepped with  chlorhexidine in a sterile fashion, and a sterile drape was applied covering the operative field. Maximum barrier sterile technique with sterile gowns and gloves were used for the procedure. A timeout was performed prior to the initiation of the procedure. Local anesthesia was provided with 1% lidocaine with epinephrine. After creating a small venotomy incision, a  micropuncture kit was utilized to access the internal jugular vein. Real-time ultrasound guidance was utilized for vascular access including the acquisition of a permanent ultrasound image documenting patency of the accessed vessel. The microwire was utilized to measure appropriate catheter length. A subcutaneous port pocket was then created along the upper chest wall utilizing a combination of sharp and blunt dissection. The pocket was irrigated with sterile saline. A single lumen ISP power injectable port was chosen for placement. The 8 Fr catheter was tunneled from the port pocket site to the venotomy incision. The port was placed in the pocket. The external catheter was trimmed to appropriate length. At the venotomy, an 8 Fr peel-away sheath was placed over a guidewire under fluoroscopic guidance. The catheter was then placed through the sheath and the sheath was removed. Final catheter positioning was confirmed and documented with a fluoroscopic spot radiograph. The port was accessed with a Huber needle, aspirated and flushed. The venotomy site was closed with an interrupted 4-0 Vicryl suture. The port pocket incision was closed with interrupted 2-0 Vicryl suture and the skin was opposed with a running subcuticular 4-0 Vicryl suture. Dermabond and Steri-strips were applied to both incisions. The port was left accessed as per recommendations from the providing clinical team. Dressings were placed. The patient tolerated the procedure well without immediate post procedural complication. FINDINGS: After catheter placement, the tip lies within the superior cavoatrial junction. The catheter aspirates and flushes normally and is ready for immediate use. IMPRESSION: Successful placement of a right internal jugular approach power injectable Port-A-Cath. The Port a Catheter was left accessed for immediate use. Electronically Signed   By: Sandi Mariscal M.D.   On: 06/21/2017 10:03   Ct Biopsy  Result Date:  06/21/2017 INDICATION: History of lymphoma. Please perform CT-guided bone marrow biopsy for tissue diagnostic purposes. EXAM: CT-GUIDED BONE MARROW BIOPSY AND ASPIRATION MEDICATIONS: None ANESTHESIA/SEDATION: Fentanyl 100 mcg IV; Versed 2 mg IV Sedation Time: 10 minutes; The patient was continuously monitored during the procedure by the interventional radiology nurse under my direct supervision. COMPLICATIONS: None immediate. PROCEDURE: Informed consent was obtained from the patient following an explanation of the procedure, risks, benefits and alternatives. The patient understands, agrees and consents for the procedure. All questions were addressed. A time out was performed prior to the initiation of the procedure. The patient was positioned prone and non-contrast localization CT was performed of the pelvis to demonstrate the iliac marrow spaces. The operative site was prepped and draped in the usual sterile fashion. Under sterile conditions and local anesthesia, a 22 gauge spinal needle was utilized for procedural planning. Next, an 11 gauge coaxial bone biopsy needle was advanced into the left iliac marrow space. Needle position was confirmed with CT imaging. Initially, bone marrow aspiration was performed. Next, a bone marrow biopsy was obtained with the 11 gauge outer bone marrow device. Samples were prepared with the cytotechnologist and deemed adequate. The needle was removed intact. Hemostasis was obtained with compression and a dressing was placed. The patient tolerated the procedure well without immediate post procedural complication. IMPRESSION: Successful CT guided left iliac bone marrow aspiration and core biopsy. Electronically Signed   By: Jenny Reichmann  Watts M.D.   On: 06/21/2017 10:22   Dg Chest Port 1 View  Result Date: 06/22/2017 CLINICAL DATA:  Acute pulmonary edema EXAM: PORTABLE CHEST 1 VIEW COMPARISON:  PA and lateral chest x-ray of May 13, 2017 FINDINGS: The lungs are well-expanded. There is  no focal infiltrate. There is no pleural effusion. The heart and pulmonary vascularity are normal. The mediastinum is normal in width. The power port catheter tip projects over the distal third of the SVC. IMPRESSION: There is no acute cardiopulmonary abnormality. Electronically Signed   By: David  Martinique M.D.   On: 06/22/2017 10:37   Ct Bone Marrow Biopsy & Aspiration  Result Date: 06/21/2017 INDICATION: History of lymphoma. Please perform CT-guided bone marrow biopsy for tissue diagnostic purposes. EXAM: CT-GUIDED BONE MARROW BIOPSY AND ASPIRATION MEDICATIONS: None ANESTHESIA/SEDATION: Fentanyl 100 mcg IV; Versed 2 mg IV Sedation Time: 10 minutes; The patient was continuously monitored during the procedure by the interventional radiology nurse under my direct supervision. COMPLICATIONS: None immediate. PROCEDURE: Informed consent was obtained from the patient following an explanation of the procedure, risks, benefits and alternatives. The patient understands, agrees and consents for the procedure. All questions were addressed. A time out was performed prior to the initiation of the procedure. The patient was positioned prone and non-contrast localization CT was performed of the pelvis to demonstrate the iliac marrow spaces. The operative site was prepped and draped in the usual sterile fashion. Under sterile conditions and local anesthesia, a 22 gauge spinal needle was utilized for procedural planning. Next, an 11 gauge coaxial bone biopsy needle was advanced into the left iliac marrow space. Needle position was confirmed with CT imaging. Initially, bone marrow aspiration was performed. Next, a bone marrow biopsy was obtained with the 11 gauge outer bone marrow device. Samples were prepared with the cytotechnologist and deemed adequate. The needle was removed intact. Hemostasis was obtained with compression and a dressing was placed. The patient tolerated the procedure well without immediate post procedural  complication. IMPRESSION: Successful CT guided left iliac bone marrow aspiration and core biopsy. Electronically Signed   By: Sandi Mariscal M.D.   On: 06/21/2017 10:22   Ir Fluoro Guide Port Insertion Right  Result Date: 06/21/2017 INDICATION: History of lymphoma. In need of durable intravenous access for chemotherapy administration. EXAM: IMPLANTED PORT A CATH PLACEMENT WITH ULTRASOUND AND FLUOROSCOPIC GUIDANCE COMPARISON:  Chest CT - 06/08/2017 MEDICATIONS: Ancef 2 gm IV; The antibiotic was administered within an appropriate time interval prior to skin puncture. ANESTHESIA/SEDATION: Moderate (conscious) sedation was employed during this procedure. A total of Versed 1 mg was administered intravenously. Moderate Sedation Time: 23 minutes. The patient's level of consciousness and vital signs were monitored continuously by radiology nursing throughout the procedure under my direct supervision. CONTRAST:  None FLUOROSCOPY TIME:  18 seconds (8 mGy) COMPLICATIONS: None immediate. PROCEDURE: The procedure, risks, benefits, and alternatives were explained to the patient. Questions regarding the procedure were encouraged and answered. The patient understands and consents to the procedure. The right neck and chest were prepped with chlorhexidine in a sterile fashion, and a sterile drape was applied covering the operative field. Maximum barrier sterile technique with sterile gowns and gloves were used for the procedure. A timeout was performed prior to the initiation of the procedure. Local anesthesia was provided with 1% lidocaine with epinephrine. After creating a small venotomy incision, a micropuncture kit was utilized to access the internal jugular vein. Real-time ultrasound guidance was utilized for vascular access including the acquisition of a  permanent ultrasound image documenting patency of the accessed vessel. The microwire was utilized to measure appropriate catheter length. A subcutaneous port pocket was then  created along the upper chest wall utilizing a combination of sharp and blunt dissection. The pocket was irrigated with sterile saline. A single lumen ISP power injectable port was chosen for placement. The 8 Fr catheter was tunneled from the port pocket site to the venotomy incision. The port was placed in the pocket. The external catheter was trimmed to appropriate length. At the venotomy, an 8 Fr peel-away sheath was placed over a guidewire under fluoroscopic guidance. The catheter was then placed through the sheath and the sheath was removed. Final catheter positioning was confirmed and documented with a fluoroscopic spot radiograph. The port was accessed with a Huber needle, aspirated and flushed. The venotomy site was closed with an interrupted 4-0 Vicryl suture. The port pocket incision was closed with interrupted 2-0 Vicryl suture and the skin was opposed with a running subcuticular 4-0 Vicryl suture. Dermabond and Steri-strips were applied to both incisions. The port was left accessed as per recommendations from the providing clinical team. Dressings were placed. The patient tolerated the procedure well without immediate post procedural complication. FINDINGS: After catheter placement, the tip lies within the superior cavoatrial junction. The catheter aspirates and flushes normally and is ready for immediate use. IMPRESSION: Successful placement of a right internal jugular approach power injectable Port-A-Cath. The Port a Catheter was left accessed for immediate use. Electronically Signed   By: Sandi Mariscal M.D.   On: 06/21/2017 10:03   Assessment / Plan: *Elevated LFT's, jaundice:  Likely due to the extensive tumor burden in her liver from the diffuse B-cell lymphoma, liver failure, tumor lysis syndrome.  Also possible component of shock liver.  Haptoglobin normal.  INR ordered for today and is pending. 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  *AKI:  Renal following as well.  Dialysis catheter being placed. *Hypoxia, hypotension, and encephalopathy:  Required intubation this AM and placed on quadruple strength levophed.  -From a GI standpoint we can only offer supportive care.  Trend LFT's and PT/INR.   LOS: 3 days   Laban Emperor. Zehr  06/23/2017, 9:07 AM  Pager number [phone]   I have discussed the case with the PA, and that is the plan I formulated. I examined the patient and spoke with her aunt at the bedside.  Patricia Mcclain is intubated and sedated, therefore cannot give a history review of systems. Blood pressure 105/70, heart rate 75 and pressor agents. Sclerae are deeply icteric She is unresponsive to stimulation while sedated, ET tube is in place. Cardiac rhythm is regular without appreciable murmur She has good air entry bilaterally, ventilated Abdomen is soft and distended with decreased bowel sounds, making it difficult to appreciate any organomegaly. She has anasarca  Jazzy has clinically deteriorated today, with persistent hypotension requiring pressors and depressed mental status requiring intubation for airway protection. She has acute renal failure and is now on dialysis.   While her transaminases have improved somewhat, her alkaline phosphatase and total bilirubin continue to rise. This is not surprising in this scenario, especially when we believe there is an element of ischemic hepatopathy. The other reasons for her elevated LFTs are large tumor burden in tumor lysis syndrome.  Her rising INR is a very worrisome sign.  She has multisystem organ failure, and her condition is critical and prognosis is uncertain  at this point. I'm afraid there is nothing else we can do to support or improve her liver function. I have ordered an INR or tomorrow morning because if he continues to worsen, it will certainly point toward a poor prognosis.  We will sign off at this point, and you may reconsult as the  need arises.  Total time 30 minutes, over half spent in chart review and counseling/updating family. Nelida Meuse III Pager [phone]  Mon-Fri 8a-5p [phone] after 5p, weekends, holidays

## 2017-06-23 NOTE — Procedures (Signed)
Arterial Catheter Insertion Procedure Note SHANTAY SONN [id number] September 03, 1975  Procedure: Insertion of Arterial Catheter  Indications: Blood pressure monitoring  Procedure Details Consent: Risks of procedure as well as the alternatives and risks of each were explained to the (patient/caregiver).  Consent for procedure obtained. Time Out: Verified patient identification, verified procedure, site/side was marked, verified correct patient position, special equipment/implants available, medications/allergies/relevent history reviewed, required imaging and test results available.  Performed  Maximum sterile technique was used including antiseptics, cap, gloves, gown, hand hygiene, mask and sheet. Skin prep: Chlorhexidine; local anesthetic administered 20 gauge catheter was inserted into left radial artery using the Seldinger technique.  Evaluation Blood flow good; BP tracing good. Complications: No apparent complications.   Ander Purpura 06/23/2017

## 2017-06-23 NOTE — Progress Notes (Signed)
Critical calcium 6.3 called to Dr Lorrene Reid. Will continue to monitor.

## 2017-06-23 NOTE — Progress Notes (Signed)
[facility] Progress Note  Subjective: not doing well, BP's in 80's overnight, minimal UOP yesterday, K better, took kayexalate.    Vitals:   06/23/17 0300 06/23/17 0400 06/23/17 0700 06/23/17 0955  BP: (!) 85/54 (!) 84/47 (!) 79/55   Pulse: 91 92 93   Resp: 15 14 15    Temp: 99.3 F (37.4 C) 99.3 F (37.4 C) 99.5 F (37.5 C)   TempSrc:      SpO2: 97% 97% 97% 98%  Weight:      Height:        Inpatient medications: . acyclovir  400 mg Oral Daily  . Chlorhexidine Gluconate Cloth  6 each Topical Daily  . dexamethasone  8 mg Intravenous Q24H  . docusate sodium  100 mg Oral BID  . insulin aspart  0-24 Units Subcutaneous TID WC  . lactose free nutrition  237 mL Oral TID WC  . levothyroxine  200 mcg Oral QAC breakfast  . multivitamin with minerals  1 tablet Oral Daily  . sertraline  200 mg Oral Daily  . sodium chloride flush  10-40 mL Intracatheter Q12H  . sodium polystyrene  30 g Oral TID   . sodium chloride    . dexmedetomidine (PRECEDEX) IV infusion    . norepinephrine (LEVOPHED) Adult infusion 25 mcg/min (06/23/17 0903)  . ondansetron (ZOFRAN) IV    . dialysis replacement fluid (prismasate)    . dialysis replacement fluid (prismasate)    . dialysate (PRISMASATE)    . sodium chloride     acetaminophen, alteplase, alum & mag hydroxide-simeth, benzocaine, diphenhydrAMINE, fentaNYL (SUBLIMAZE) injection, fentaNYL (SUBLIMAZE) injection, guaiFENesin-dextromethorphan, heparin, hydrocortisone, lidocaine-prilocaine, morphine injection, ondansetron **OR** ondansetron **OR** ondansetron (ZOFRAN) IV **OR** ondansetron (ZOFRAN) IV, senna-docusate, sodium chloride, sodium chloride flush  Exam: Gen groggy, sedated, arouses easily and follows all commands, Ox 3 No rash, cyanosis or gangrene Sclera anicteric, throat clear and dry  No jvd or bruits, flat neck veins Chest clear bilat to bases RRR no MRG Abd obese, large liver down 8 cm, distended/ obese, dec'd BS, no  rebound GU deferred MS no joint effusions or deformity Ext no sig pitting LE or UE edema / no wounds or ulcers Neuro is lethargic, but Ox 3  CXR - clear, no active disease  Impression: 1. Acute renal failure - due to tumor lysis syndrome.  Rasburicase per oncology.  Now has hypotension and oliguric AKI.  Agree w CCM that best way to manage is with CRRT.  See orders.  Will keep her + on UF since she still looks a little dry.  2. 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 B cell lymphoma - recently dx'd with heavy liver involvement 4. ^LFT"s - d/t #3 5. DM - per primary 6. HTN - bp's meds on hold   Plan - as above, start CRRT today   Kelly Splinter MD [facility] pager [phone]   06/23/2017, 10:27 AM   Recent Labs  Lab 06/22/17 1112 06/22/17 1315 06/23/17 0416  NA 135 135 137  K 5.8* 5.9* 4.6  CL 100* 101 98*  CO2 19* 17* 21*  GLUCOSE 120* 123* 245*  BUN 32* 33* 42*  CREATININE 2.30* 2.40* 2.96*  CALCIUM 8.1* 7.9* 6.5*  PHOS 10.5*  --  10.2*   Recent Labs  Lab 06/22/17 0810 06/22/17 1112 06/23/17 0416  AST 2,245* 2,347* 2,117*  ALT 446* 461* 415*  ALKPHOS 409* 437* 613*  BILITOT 6.0* 5.8* 6.4*  PROT 6.5 6.5 6.0*  ALBUMIN 2.5*  2.4* 2.2*   Recent Labs  Lab 07/03/2017 1233 06/21/17 0359 06/22/17 0810 06/23/17 0416  WBC 11.6* 10.1 10.7* 8.7  NEUTROABS 9.2* 7.9* 8.0*  --   HGB 14.1 12.7 13.2 12.8  HCT 42.5 40.5 41.6 40.2  MCV 78.1 79.3 80.8 79.3  PLT PLATELET CLUMPS NOTED ON SMEAR, COUNT APPEARS ADEQUATE 168 196 190   Iron/TIBC/Ferritin/ %Sat No results found for: IRON, TIBC, FERRITIN, IRONPCTSAT

## 2017-06-23 NOTE — Procedures (Signed)
Central Venous Dialysis Catheter Insertion Procedure Note Patricia Mcclain [id number] 07-24-1976  Procedure: Insertion of Central Venous Catheter Indications: Dialysis  Procedure Details Consent: Risks of procedure as well as the alternatives and risks of each were explained to the (patient/caregiver).  Consent for procedure obtained. Time Out: Verified patient identification, verified procedure, site/side was marked, verified correct patient position, special equipment/implants available, medications/allergies/relevent history reviewed, required imaging and test results available.  Performed  Maximum sterile technique was used including antiseptics, cap, gloves, gown, hand hygiene, mask and sheet. Skin prep: Chlorhexidine; local anesthetic administered A antimicrobial bonded/coated triple lumen catheter was placed in the left internal jugular vein using the Seldinger technique.  Evaluation Blood flow good Complications: No apparent complications Patient did tolerate procedure well. Chest X-ray ordered to verify placement.  CXR: pending.  U/S used in placement  Patricia Mcclain 06/23/2017, 9:44 AM

## 2017-06-24 ENCOUNTER — Inpatient Hospital Stay (HOSPITAL_COMMUNITY): Payer: Medicaid Other

## 2017-06-24 DIAGNOSIS — J9601 Acute respiratory failure with hypoxia: Secondary | ICD-10-CM

## 2017-06-24 DIAGNOSIS — K7201 Acute and subacute hepatic failure with coma: Secondary | ICD-10-CM

## 2017-06-24 DIAGNOSIS — G9340 Encephalopathy, unspecified: Secondary | ICD-10-CM

## 2017-06-24 DIAGNOSIS — I361 Nonrheumatic tricuspid (valve) insufficiency: Secondary | ICD-10-CM

## 2017-06-24 DIAGNOSIS — N179 Acute kidney failure, unspecified: Secondary | ICD-10-CM

## 2017-06-24 LAB — BLOOD GAS, ARTERIAL
Acid-base deficit: 7 mmol/L — ABNORMAL HIGH (ref 0.0–2.0)
BICARBONATE: 18.5 mmol/L — AB (ref 20.0–28.0)
Drawn by: 232811
FIO2: 30
LHR: 24 {breaths}/min
O2 SAT: 97 %
PATIENT TEMPERATURE: 37.1
PCO2 ART: 39 mmHg (ref 32.0–48.0)
PEEP/CPAP: 5 cmH2O
PO2 ART: 110 mmHg — AB (ref 83.0–108.0)
VT: 470 mL
pH, Arterial: 7.298 — ABNORMAL LOW (ref 7.350–7.450)

## 2017-06-24 LAB — CBC
HEMATOCRIT: 45.1 % (ref 36.0–46.0)
HEMOGLOBIN: 14.7 g/dL (ref 12.0–15.0)
MCH: 25.9 pg — ABNORMAL LOW (ref 26.0–34.0)
MCHC: 32.6 g/dL (ref 30.0–36.0)
MCV: 79.4 fL (ref 78.0–100.0)
Platelets: 62 10*3/uL — ABNORMAL LOW (ref 150–400)
RBC: 5.68 MIL/uL — AB (ref 3.87–5.11)
RDW: 29.3 % — ABNORMAL HIGH (ref 11.5–15.5)
WBC: 8.6 10*3/uL (ref 4.0–10.5)

## 2017-06-24 LAB — MAGNESIUM
MAGNESIUM: 2.2 mg/dL (ref 1.7–2.4)
MAGNESIUM: 2.2 mg/dL (ref 1.7–2.4)

## 2017-06-24 LAB — COMPREHENSIVE METABOLIC PANEL
ALT: 190 U/L — ABNORMAL HIGH (ref 14–54)
ANION GAP: 18 — AB (ref 5–15)
AST: 413 U/L — AB (ref 15–41)
Albumin: 2.2 g/dL — ABNORMAL LOW (ref 3.5–5.0)
Alkaline Phosphatase: 666 U/L — ABNORMAL HIGH (ref 38–126)
BUN: 26 mg/dL — AB (ref 6–20)
CHLORIDE: 100 mmol/L — AB (ref 101–111)
CO2: 18 mmol/L — ABNORMAL LOW (ref 22–32)
Calcium: 6.7 mg/dL — ABNORMAL LOW (ref 8.9–10.3)
Creatinine, Ser: 1.81 mg/dL — ABNORMAL HIGH (ref 0.44–1.00)
GFR, EST AFRICAN AMERICAN: 39 mL/min — AB (ref >60)
GFR, EST NON AFRICAN AMERICAN: 33 mL/min — AB (ref >60)
Glucose, Bld: 90 mg/dL (ref 65–99)
Potassium: 4.2 mmol/L (ref 3.5–5.1)
Sodium: 136 mmol/L (ref 135–145)
Total Bilirubin: 6.9 mg/dL — ABNORMAL HIGH (ref 0.3–1.2)
Total Protein: 6.2 g/dL — ABNORMAL LOW (ref 6.5–8.1)

## 2017-06-24 LAB — GLUCOSE, CAPILLARY
GLUCOSE-CAPILLARY: 85 mg/dL (ref 65–99)
GLUCOSE-CAPILLARY: 115 mg/dL — AB (ref 65–99)
Glucose-Capillary: 106 mg/dL — ABNORMAL HIGH (ref 65–99)
Glucose-Capillary: 107 mg/dL — ABNORMAL HIGH (ref 65–99)
Glucose-Capillary: 109 mg/dL — ABNORMAL HIGH (ref 65–99)
Glucose-Capillary: 82 mg/dL (ref 65–99)
Glucose-Capillary: 88 mg/dL (ref 65–99)

## 2017-06-24 LAB — RENAL FUNCTION PANEL
ANION GAP: 20 — AB (ref 5–15)
Albumin: 1.9 g/dL — ABNORMAL LOW (ref 3.5–5.0)
BUN: 22 mg/dL — AB (ref 6–20)
CALCIUM: 6.4 mg/dL — AB (ref 8.9–10.3)
CO2: 12 mmol/L — AB (ref 22–32)
CREATININE: 1.51 mg/dL — AB (ref 0.44–1.00)
Chloride: 104 mmol/L (ref 101–111)
GFR, EST AFRICAN AMERICAN: 48 mL/min — AB (ref >60)
GFR, EST NON AFRICAN AMERICAN: 42 mL/min — AB (ref >60)
GLUCOSE: 115 mg/dL — AB (ref 65–99)
PHOSPHORUS: 5.4 mg/dL — AB (ref 2.5–4.6)
Potassium: 5 mmol/L (ref 3.5–5.1)
SODIUM: 136 mmol/L (ref 135–145)

## 2017-06-24 LAB — APTT: aPTT: 45 seconds — ABNORMAL HIGH (ref 24–36)

## 2017-06-24 LAB — ECHOCARDIOGRAM LIMITED
Height: 66 in
Weight: 4151.7 oz

## 2017-06-24 LAB — PROCALCITONIN: Procalcitonin: 36.48 ng/mL

## 2017-06-24 LAB — PROTIME-INR
INR: 3.72
Prothrombin Time: 36.6 seconds — ABNORMAL HIGH (ref 11.4–15.2)

## 2017-06-24 LAB — PHOSPHORUS: PHOSPHORUS: 4.9 mg/dL — AB (ref 2.5–4.6)

## 2017-06-24 LAB — URIC ACID: URIC ACID, SERUM: 3.3 mg/dL (ref 2.3–6.6)

## 2017-06-24 MED ORDER — DEXMEDETOMIDINE HCL IN NACL 400 MCG/100ML IV SOLN: 0.0000 ug/kg/h | INTRAVENOUS | Status: DC

## 2017-06-24 MED ORDER — VITAL HIGH PROTEIN PO LIQD
1000.0000 mL | ORAL | Status: DC
  Filled 2017-06-24: qty 1000

## 2017-06-24 MED ORDER — ALTEPLASE 2 MG IJ SOLR: 2.0000 mg | Freq: Once | INTRAMUSCULAR | Status: DC | PRN

## 2017-06-24 MED ORDER — DIPHENHYDRAMINE HCL 50 MG PO CAPS
50.0000 mg | ORAL_CAPSULE | Freq: Once | ORAL | Status: DC
  Filled 2017-06-24: qty 1

## 2017-06-24 MED ORDER — FENTANYL CITRATE (PF) 100 MCG/2ML IJ SOLN: 50.0000 ug | Freq: Once | INTRAMUSCULAR | Status: DC

## 2017-06-24 MED ORDER — FENTANYL BOLUS VIA INFUSION
50.0000 ug | INTRAVENOUS | Status: DC | PRN
  Administered 2017-06-25: 50 ug via INTRAVENOUS
  Filled 2017-06-24: qty 50

## 2017-06-24 MED ORDER — ALBUTEROL SULFATE (2.5 MG/3ML) 0.083% IN NEBU: 2.5000 mg | INHALATION_SOLUTION | Freq: Once | RESPIRATORY_TRACT | Status: DC | PRN

## 2017-06-24 MED ORDER — SODIUM CHLORIDE 0.9 % IV SOLN: 0.0000 ug/kg/h | INTRAVENOUS | Status: DC

## 2017-06-24 MED ORDER — DIPHENHYDRAMINE HCL 50 MG/ML IJ SOLN: 50.0000 mg | Freq: Once | INTRAMUSCULAR | Status: DC | PRN

## 2017-06-24 MED ORDER — EPINEPHRINE PF 1 MG/ML IJ SOLN
0.5000 mg | Freq: Once | INTRAMUSCULAR | Status: DC | PRN
  Filled 2017-06-24: qty 1

## 2017-06-24 MED ORDER — VANCOMYCIN HCL IN DEXTROSE 1-5 GM/200ML-% IV SOLN
1000.0000 mg | INTRAVENOUS | Status: DC
  Administered 2017-06-25 – 2017-06-26 (×2): 1000 mg via INTRAVENOUS
  Filled 2017-06-24 (×3): qty 200

## 2017-06-24 MED ORDER — EPINEPHRINE PF 1 MG/10ML IJ SOSY: 0.2500 mg | PREFILLED_SYRINGE | Freq: Once | INTRAMUSCULAR | Status: DC | PRN

## 2017-06-24 MED ORDER — FAMOTIDINE IN NACL 20-0.9 MG/50ML-% IV SOLN: 20.0000 mg | Freq: Once | INTRAVENOUS | Status: DC | PRN

## 2017-06-24 MED ORDER — VITAL HIGH PROTEIN PO LIQD
1000.0000 mL | ORAL | Status: DC
  Administered 2017-06-24 – 2017-06-25 (×2): 1000 mL
  Filled 2017-06-24 (×3): qty 1000

## 2017-06-24 MED ORDER — SODIUM CHLORIDE 0.9 % IV BOLUS (SEPSIS)
1000.0000 mL | Freq: Four times a day (QID) | INTRAVENOUS | Status: AC | PRN
  Administered 2017-06-24 (×4): 1000 mL via INTRAVENOUS

## 2017-06-24 MED ORDER — VITAMIN K1 10 MG/ML IJ SOLN
10.0000 mg | Freq: Once | INTRAVENOUS | Status: AC
  Administered 2017-06-24: 10 mg via INTRAVENOUS
  Filled 2017-06-24: qty 1

## 2017-06-24 MED ORDER — SODIUM CHLORIDE 0.9% FLUSH: 10.0000 mL | INTRAVENOUS | Status: DC | PRN

## 2017-06-24 MED ORDER — SODIUM CHLORIDE 0.9% FLUSH: 3.0000 mL | INTRAVENOUS | Status: DC | PRN

## 2017-06-24 MED ORDER — SODIUM CHLORIDE 0.9 % IV SOLN
400.0000 mg | Freq: Once | INTRAVENOUS | Status: AC
  Administered 2017-06-24: 400 mg via INTRAVENOUS
  Filled 2017-06-24: qty 40

## 2017-06-24 MED ORDER — DEXAMETHASONE SODIUM PHOSPHATE 10 MG/ML IJ SOLN
20.0000 mg | INTRAMUSCULAR | Status: DC
  Administered 2017-06-24 – 2017-06-26 (×3): 20 mg via INTRAVENOUS
  Filled 2017-06-24 (×4): qty 2
  Filled 2017-06-24: qty 5

## 2017-06-24 MED ORDER — SODIUM CHLORIDE 0.9 % IV SOLN
25.0000 ug/h | INTRAVENOUS | Status: DC
  Administered 2017-06-24 – 2017-06-25 (×2): 25 ug/h via INTRAVENOUS
  Filled 2017-06-24 (×2): qty 50

## 2017-06-24 MED ORDER — PRO-STAT SUGAR FREE PO LIQD: 30.0000 mL | Freq: Two times a day (BID) | ORAL | Status: DC

## 2017-06-24 MED ORDER — METHYLPREDNISOLONE SODIUM SUCC 125 MG IJ SOLR: 125.0000 mg | Freq: Once | INTRAMUSCULAR | Status: DC | PRN

## 2017-06-24 MED ORDER — HEPARIN SOD (PORK) LOCK FLUSH 100 UNIT/ML IV SOLN
500.0000 [IU] | Freq: Once | INTRAVENOUS | Status: DC | PRN
  Filled 2017-06-24: qty 5

## 2017-06-24 MED ORDER — DEXTROSE 5 % IV SOLN
2.0000 g | Freq: Two times a day (BID) | INTRAVENOUS | Status: DC
  Administered 2017-06-24 – 2017-06-26 (×5): 2 g via INTRAVENOUS
  Filled 2017-06-24 (×6): qty 2

## 2017-06-24 MED ORDER — DIPHENHYDRAMINE HCL 50 MG/ML IJ SOLN
50.0000 mg | Freq: Once | INTRAMUSCULAR | Status: AC
  Administered 2017-06-24: 50 mg via INTRAVENOUS
  Filled 2017-06-24: qty 1

## 2017-06-24 MED ORDER — HEPARIN SOD (PORK) LOCK FLUSH 100 UNIT/ML IV SOLN
250.0000 [IU] | Freq: Once | INTRAVENOUS | Status: DC | PRN
Start: 2017-06-24 — End: 2017-06-27
  Filled 2017-06-24: qty 2.5

## 2017-06-24 MED ORDER — DIPHENHYDRAMINE HCL 50 MG/ML IJ SOLN: 25.0000 mg | Freq: Once | INTRAMUSCULAR | Status: DC | PRN

## 2017-06-24 MED ORDER — VANCOMYCIN HCL 10 G IV SOLR
2250.0000 mg | Freq: Once | INTRAVENOUS | Status: DC
  Filled 2017-06-24: qty 2250

## 2017-06-24 MED ORDER — VANCOMYCIN HCL 10 G IV SOLR
2000.0000 mg | Freq: Once | INTRAVENOUS | Status: AC
  Administered 2017-06-24: 2000 mg via INTRAVENOUS
  Filled 2017-06-24: qty 2000

## 2017-06-24 MED ORDER — SODIUM CHLORIDE 0.9 % IV SOLN: Freq: Once | INTRAVENOUS | Status: DC | PRN

## 2017-06-24 MED ORDER — PRO-STAT SUGAR FREE PO LIQD
60.0000 mL | Freq: Two times a day (BID) | ORAL | Status: DC
  Administered 2017-06-24 – 2017-06-26 (×5): 60 mL
  Filled 2017-06-24 (×5): qty 60

## 2017-06-24 MED ORDER — SODIUM CHLORIDE 0.9 % IV SOLN
0.0000 ug/kg/h | INTRAVENOUS | Status: DC
  Filled 2017-06-24: qty 4

## 2017-06-24 NOTE — Progress Notes (Signed)
PULMONARY / CRITICAL CARE MEDICINE   Name: Patricia Mcclain MRN: [id number] DOB: July 26, 1976    ADMISSION DATE:  06/10/2017 CONSULTATION DATE:  11/14  REFERRING MD:  Dr. Maryland Pink, Darnell Level   CHIEF COMPLAINT:  Encephalopathy  HISTORY OF PRESENT ILLNESS:   41 year old female with past medical history as below, which is significant for insulin-dependent diabetes, hypothyroidism, congenital absence of one kidney, and hypertension.  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  She was hospitalized for further evaluation and she did receive 1 dose of Rasburicase on November 12 after initiation of IV steroids.  Hospital course since complicated by tumor lysis syndrome in the constellation of worsening renal function, hyperkalemia, increased uric acid level, and worsening LFTs.  11/14 she became encephalopathic after receiving pain medication and transfer to the ICU was requested.  SUBJECTIVE:  Appears comfortable on the ventilator  VITAL SIGNS: BP (!) 80/53   Pulse 79   Temp 100 F (37.8 C)   Resp 20   Ht _0  (1.676 m)   Wt 259 lb 7.7 oz (117.7 kg)   LMP 06/07/2017   SpO2 100%   BMI 41.88 kg/m    HEMODYNAMICS:    VENTILATOR SETTINGS: Vent Mode: PRVC FiO2 (%):  [30 %-100 %] 30 % Set Rate:  [14 bmp-24 bmp] 24 bmp Vt Set:  [450 mL-470 mL] 470 mL PEEP:  [5 cmH20] 5 cmH20 Plateau Pressure:  [15 HBZ16-96 cmH20] 21 cmH20  INTAKE / OUTPUT: I/O last 3 completed shifts: In: 5042.3 [I.V.:5042.3] Out: 1324 [Urine:50; Emesis/NG output:1; Other:1273]  PHYSICAL EXAMINATION: General: 41 year old female sedated on ventilator currently on CRRT and multiple pressors HEENT: Orally intubated scleral jaundice mucous membrane moist left IJ dialysis catheter unremarkable Pulmonary/chest: Clear to auscultation decreased bases right port dressing clean dry and intact Cardiac: Regular rate and rhythm without murmur rub or gallop Abdomen: Distended, soft, nontender  to palpation today, positive bowel sounds GU: Concentrated dark urine Neuro/psych: Sedated, moves all extremities but has generalized weakness  LABS:  BMET Recent Labs  Lab 06/23/17 0416 06/23/17 1649 06/24/17 0508  NA 137 136 136  K 4.6 4.6 4.2  CL 98* 98* 100*  CO2 21* 21* 18*  BUN 42* 36* 26*  CREATININE 2.96* 2.46* 1.81*  GLUCOSE 245* 193* 90    Electrolytes Recent Labs  Lab 06/23/17 0416 06/23/17 1649 06/24/17 0508  CALCIUM 6.5* 6.3* 6.7*  MG 2.0  --  2.2  PHOS 10.2* 7.9* 4.9*    CBC Recent Labs  Lab 06/22/17 0810 06/23/17 0416 06/24/17 0508  WBC 10.7* 8.7 8.6  HGB 13.2 12.8 14.7  HCT 41.6 40.2 45.1  PLT 196 190 62*    Coag's Recent Labs  Lab 06/22/17 0810 06/23/17 1232 06/24/17 0508  APTT 38*  --  45*  INR 1.33 2.04 3.72    Sepsis Markers Recent Labs  Lab 06/22/17 0934 06/22/17 1500  LATICACIDVEN 3.9* 4.6*    ABG Recent Labs  Lab 06/23/17 1435 06/23/17 1550 06/24/17 0406  PHART 7.192* 7.250* 7.298*  PCO2ART 55.6* 45.8 39.0  PO2ART 487* 164* 110*    Liver Enzymes Recent Labs  Lab 06/22/17 1112 06/23/17 0416 06/23/17 1649 06/24/17 0508  AST 2,347* 2,117*  --  413*  ALT 461* 415*  --  190*  ALKPHOS 437* 613*  --  666*  BILITOT 5.8* 6.4*  --  6.9*  ALBUMIN 2.4* 2.2* 2.3* 2.2*    Cardiac Enzymes No results for input(s): TROPONINI, PROBNP in the last  168 hours.  Glucose Recent Labs  Lab 06/23/17 1248 06/23/17 1549 06/23/17 2028 06/24/17 0025 06/24/17 0415 06/24/17 0730  GLUCAP 189* 165* 165* 107* 82 85    Imaging Dg Abd 1 View  Result Date: 06/23/2017 CLINICAL DATA:  NG tube placement. EXAM: ABDOMEN - 1 VIEW COMPARISON:  CT 06/06/2017 . FINDINGS: NG tube noted with tip below left hemidiaphragm. No bowel distention. No free air. Basilar atelectasis . IMPRESSION: NG tube noted with tip below left hemidiaphragm . Electronically Signed   By: Marcello Moores  Register   On: 06/23/2017 10:33   Dg Chest Port 1 View  Result  Date: 06/24/2017 CLINICAL DATA:  Hypoxia EXAM: PORTABLE CHEST 1 VIEW COMPARISON:  June 23, 2017 FINDINGS: Endotracheal tube tip is 3.3 cm above the carina. Central catheter tips are at the cavoatrial junction. Nasogastric tube tip and side port below the diaphragm. No pneumothorax. There is mild right base atelectasis. Lungs elsewhere are clear. Heart is upper normal in size with pulmonary vascularity within normal limits. No adenopathy. No bone lesions. IMPRESSION: Tube and catheter positions as described without evident pneumothorax. Slight right base atelectasis. Lungs elsewhere clear. Stable cardiac silhouette. Electronically Signed   By: Lowella Grip III M.D.   On: 06/24/2017 07:16   Dg Chest Port 1 View  Result Date: 06/23/2017 CLINICAL DATA:  Central line placement EXAM: PORTABLE CHEST 1 VIEW COMPARISON:  Yesterday FINDINGS: Left IJ central line with tip at the upper cavoatrial junction. No pneumothorax or mediastinal widening. Right-sided porta catheter with tip at the upper cavoatrial junction. Endotracheal tube tip at the clavicular heads. An orogastric tube reaches the stomach. Mild atelectasis at the right base. IMPRESSION: 1. New central line without complicating feature. 2. Mild atelectasis at the right base. Electronically Signed   By: Monte Fantasia M.D.   On: 06/23/2017 10:39     STUDIES:  11/5 Liver biopsy >>> 11/13 Bone marrow biopsy >>>  CULTURES:   ANTIBIOTICS:   SIGNIFICANT EVENTS: 11/12 initiation of IV steroids 11/13: Rasburicase  Administered on 11/12 and 11/14 11/13 port placed 11/14 tumor lysis syndrome suspected 11/15 encephalopathic, transfer to ICU  LINES/TUBES: Port 11/13 >  DISCUSSION: 41 year old female with recent diagnosis of B-cell lymphoma admitted for further workup on the 12th.  Course has been complicated by tumor lysis syndrome after administration of systemic steroids and associated hepatic and renal insufficiency.  She has been given  Dilaudid on the morning of 11/14 for pain and became lethargic.  She was transferred to the ICU in the setting. She remains in shock I am concerned there could be an element of further cardiac suppression acidosis is better.  There is no evidence of infection however sepsis could also explain this.  For today we are planning to initiate chemotherapy.  We have increased steroid dosing.  She is to get her first dose of Rituxan today, challenging with fluid as her CVP is 7 family updated  ASSESSMENT / PLAN:  PULMONARY A: Ventilator dependence in setting of severe metabolic acidosis and circulatory shock PCXR personally reviewed: Lungs remain clear, slight elevated right hemithorax.  ET tube and central venous access are in satisfactory position. P:   Continue full ventilator support PAD protocol RASS goal -2 Ventilator associated pneumonia prevention intervention Follow-up chest x-ray in a.m. Follow-up ABG in a.m.  CARDIOVASCULAR A:  Circulatory shock; initially this was likely due to cardiac suppression from acidosis now wonder about medication/sedation; currently no evidence of infection so doubt sepsis History HTN P:  Continue to  titrate vasoactive drips for mean arterial pressure goal greater than 65 Continue positive balance goal through CRRT Continue telemetry monitoring Stopping Precedex Repeat echocardiogram evaluating for cardiac dysfunction We will send procalcitonin; if elevated will add empiric antibiotics and culture Checking central venous pressure NS bolus   RENAL A:   Acute kidney injury -  likely secondary to TLS; creatinine climbing Metabolic acidosis in the setting of tumor lysis syndrome and acute kidney failure Improved with CRRT P:   Continue CRRT per nephrology Follow-up serial chemistries  GASTROINTESTINAL A:   Transaminitis - likely secondary to TLS however; this is improved greatly since starting CRRT Oncology also raises concern that this could also be  related to the lymphoma in general. Aspiration risk P:   Start tube feeds PPI for SBP Continue to trend LFTs  HEMATOLOGIC/Oncologic A:   Diffuse large B-cell lymphoma. Recent Dx. Staging incomplete;  got 2 doses of Decadron, complicated by Tumor lysis syndrome has received 2 doses of Rasburicase;  Worsening thrombocytopenia Coagulopathy P:  Discussed case with oncology this morning.  Plan is to increase Decadron dosing today and initiate Rituxan Will need to watch closely for worsening tumor lysis Major risk of Rituxan is anaphylaxis, she is already on multiple pressors and airway protected would add epinephrine if indicated We will give vitamin K SCDs to lower extremities Holding subcu heparin   INFECTIOUS A:   No acute issues P:   Checking pro-calcitonin If elevated will culture and start empiric antibiotics  ENDOCRINE A:   IDDM Hypothyroid P:   Sliding scale insulin Synthroid replacement  NEUROLOGIC A:   Acute metabolic vs toxic encephalopathy P:   PAD protocol with RASS: Negative 2  FAMILY  - Updates: Patient and mother update din ICU 11/14  - Inter-disciplinary family meet or Palliative Care meeting due by:  11/19  My critical care times 45 minutes Erick Colace ACNP-BC [place] Pager # [phone] OR # [phone] if no answer  Attending Note:

## 2017-06-24 NOTE — Progress Notes (Signed)
Pharmacy Antibiotic Note  Patricia Mcclain is a 41 y.o. female admitted on 06/11/2017.  Newly diagnosed lymphoma with TLS, currently requiring pressor support and CRRT.  Pharmacy has been consulted for vancomycin and cefepime dosing for rule out sepsis.  Plan: Vancomycin 2g IV loading dose then 1g IV q24h. Cefepime 2g IV q12h.  Height: 5\' 6"  (167.6 cm) Weight: 259 lb 7.7 oz (117.7 kg) IBW/kg (Calculated) : 59.3  Temp (24hrs), Avg:98.7 F (37.1 C), Min:97.2 F (36.2 C), Max:100.4 F (38 C)  Recent Labs  Lab 06/11/2017 1233 06/21/17 0359  06/22/17 0810 06/22/17 0934 06/22/17 1112 06/22/17 1315 06/22/17 1500 06/23/17 0416 06/23/17 1649 06/24/17 0508  WBC 11.6* 10.1  --  10.7*  --   --   --   --  8.7  --  8.6  CREATININE 0.76 0.85   < > 2.18*  --  2.30* 2.40*  --  2.96* 2.46* 1.81*  LATICACIDVEN  --   --   --   --  3.9*  --   --  4.6*  --   --   --    < > = values in this interval not displayed.    Estimated Creatinine Clearance: 52.9 mL/min (A) (by C-G formula based on SCr of 1.81 mg/dL (H)).    Allergies  Allergen Reactions  . Oxycodone Other (See Comments)    Pt stated she hallucinated from the oxycodone    Antimicrobials this admission:  11/16 Vanc >> 11/16 Cefepime >>  Dose adjustments this admission:   Microbiology results:  11/14 MRSA PCR: neg  Thank you for allowing pharmacy to be a part of this patient's care.  Hershal Coria 06/24/2017 11:12 AM

## 2017-06-24 NOTE — Progress Notes (Signed)
Rituxan dose and dilution verified with Aldean Baker, RN.

## 2017-06-24 NOTE — Progress Notes (Signed)
[facility] Progress Note  Subjective: on CRRT, one filter clotted overnight.  No issues now, cath working well. I/O were + 2L yest. Wt down today, up 4kg total since admission. CVP 9 this am.   Vitals:   06/24/17 0645 06/24/17 0700 06/24/17 0730 06/24/17 0800  BP:      Pulse: 80 79 78 78  Resp: (!) 25 20 (!) 22 (!) 23  Temp: 100 F (37.8 C) 100 F (37.8 C) 99.7 F (37.6 C) 99.3 F (37.4 C)  TempSrc:      SpO2: 99% 99% 100% 100%  Weight:      Height:        Inpatient medications: . acyclovir  400 mg Oral Daily  . chlorhexidine  15 mL Mouth Rinse BID  . Chlorhexidine Gluconate Cloth  6 each Topical Daily  . dexamethasone  20 mg Intravenous Q24H  . diphenhydrAMINE  50 mg Intravenous Once  . fentaNYL (SUBLIMAZE) injection  50 mcg Intravenous Once  . insulin aspart  0-15 Units Subcutaneous Q4H  . insulin aspart  0-24 Units Subcutaneous TID WC  . levothyroxine  200 mcg Oral QAC breakfast  . mouth rinse  15 mL Mouth Rinse q12n4p  . multivitamin with minerals  1 tablet Oral Daily  . pantoprazole (PROTONIX) IV  40 mg Intravenous Q24H  . riTUXimab (RITUXAN) IV infusion  400 mg Intravenous Once  . sertraline  200 mg Oral Daily  . sodium chloride flush  10-40 mL Intracatheter Q12H   . sodium chloride 100 mL/hr at 06/24/17 0409  . sodium chloride    . sodium chloride    . famotidine    . fentaNYL infusion INTRAVENOUS    . norepinephrine (LEVOPHED) Adult infusion 35 mcg/min (06/24/17 0800)  . ondansetron (ZOFRAN) IV    . phenylephrine (NEO-SYNEPHRINE) Adult infusion 90 mcg/min (06/24/17 0800)  . dialysis replacement fluid (prismasate) 400 mL/hr at 06/23/17 1300  . dialysis replacement fluid (prismasate) 200 mL/hr at 06/23/17 1308  . dialysate (PRISMASATE) 2,000 mL/hr at 06/24/17 0717  . sodium chloride    . vasopressin (PITRESSIN) infusion - *FOR SHOCK* 0.03 Units/min (06/24/17 0700)   Place/Maintain arterial line **AND** sodium chloride, sodium chloride,  acetaminophen, albuterol, alteplase, alteplase, alum & mag hydroxide-simeth, benzocaine, diphenhydrAMINE, diphenhydrAMINE, diphenhydrAMINE, EPINEPHrine, EPINEPHrine, EPINEPHrine, EPINEPHrine, famotidine, fentaNYL, fentaNYL (SUBLIMAZE) injection, fentaNYL (SUBLIMAZE) injection, guaiFENesin-dextromethorphan, heparin, heparin lock flush, heparin lock flush, hydrocortisone, lidocaine-prilocaine, methylPREDNISolone sodium succinate, morphine injection, ondansetron **OR** ondansetron **OR** ondansetron (ZOFRAN) IV **OR** ondansetron (ZOFRAN) IV, senna-docusate, sodium chloride, sodium chloride flush, sodium chloride flush, sodium chloride flush  Exam: Gen sedated on the vent No jvd Chest clear ant and lat RRR no MRG Abd obese, large liver down 8 cm, distended, dec'd BS Ext - diffuse 2+ LE edema dependent areas Neuro is sedated and poorly responsive  CXR - clear, no active disease  Impression: 1. Acute renal failure - Rasburicase per oncology.  AKI due to severe shock most likely.  Uric acid controlled now at 3.3, peak was 13.  Phos down 4.9, peaked at 10.2.  Cont CRRT, D#2.  CXR clear , on 3 pressors. Would recommend try to push CVP up to around 15 if possible w/ saline boluses. Ca corrected is 8.3 which is OK for now.   2. Shock - severe, due to TLS most likely. Have d/w CCM, will try to push volume up today, see if helps w BP's.  3. Solitary kidney, congenital - baseline creat 0.7 4. B cell lymphoma - recently dx'd  with heavy liver involvement 5. ^LFT"s - d/t #3 6. DM - per primary   Plan - as above   Kelly Splinter MD [facility] pager [phone]   06/24/2017, 9:18 AM   Recent Labs  Lab 06/23/17 0416 06/23/17 1649 06/24/17 0508  NA 137 136 136  K 4.6 4.6 4.2  CL 98* 98* 100*  CO2 21* 21* 18*  GLUCOSE 245* 193* 90  BUN 42* 36* 26*  CREATININE 2.96* 2.46* 1.81*  CALCIUM 6.5* 6.3* 6.7*  PHOS 10.2* 7.9* 4.9*   Recent Labs  Lab 06/22/17 1112 06/23/17 0416  06/23/17 1649 06/24/17 0508  AST 2,347* 2,117*  --  413*  ALT 461* 415*  --  190*  ALKPHOS 437* 613*  --  666*  BILITOT 5.8* 6.4*  --  6.9*  PROT 6.5 6.0*  --  6.2*  ALBUMIN 2.4* 2.2* 2.3* 2.2*   Recent Labs  Lab 06/19/2017 1233 06/21/17 0359 06/22/17 0810 06/23/17 0416 06/24/17 0508  WBC 11.6* 10.1 10.7* 8.7 8.6  NEUTROABS 9.2* 7.9* 8.0*  --   --   HGB 14.1 12.7 13.2 12.8 14.7  HCT 42.5 40.5 41.6 40.2 45.1  MCV 78.1 79.3 80.8 79.3 79.4  PLT PLATELET CLUMPS NOTED ON SMEAR, COUNT APPEARS ADEQUATE 168 196 190 62*   Iron/TIBC/Ferritin/ %Sat No results found for: IRON, TIBC, FERRITIN, IRONPCTSAT

## 2017-06-24 NOTE — Progress Notes (Signed)
Nutrition Follow-up  DOCUMENTATION CODES:   Obesity unspecified, Non-severe (moderate) malnutrition in context of acute illness/injury  INTERVENTION:  - Will order Vital High Protein @ 15 mL/hr with 60 mL Prostat BID. This regimen will provide 760 kcal (58% minimum estimated kcal need), 91 grams of protein (57% estimated protein need), and 301 mL free water.  - Will change multivitamin to liquid per NGT.  - Will monitor pressure support and ability to advance TF rate.   NUTRITION DIAGNOSIS:   Moderate Malnutrition related to acute illness(new diagnosis of DLBCL) as evidenced by percent weight loss, energy intake < 75% for > 7 days. -ongoing  GOAL:   Provide needs based on ASPEN/SCCM guidelines -unmet at this time.   MONITOR:   TF tolerance, Vent status, Weight trends, Labs  REASON FOR ASSESSMENT:   Consult Enteral/tube feeding initiation and management  ASSESSMENT:   41 y.o. female is admitted for high dose, inpatient chemotherapy. Pt with history of Diffuse large B-cell lymphoma, progressive decline in performance status with signs of liver failure, solitary kidney.  11/16 Pt remains intubated with NGT in place. Consult for TF received. Will provide trickle TF only given current pressor support. Weight -4 lbs/1.6 kg since yesterday. Pt remains on CRRT with plan to continue. Plan to start chemo today; reviewed Dr. Calton Dach note in detail. Goal for TF: Vital High Protein @ 40 mL/hr with 5 packets Prostat/day. This regimen will provide 1460 kcal, 159 grams of protein, and 802 mL free water.   Patient is currently intubated on ventilator support MV: 13.6 L/min Temp (24hrs), Avg:98.7 F (37.1 C), Min:97.2 F (36.2 C), Max:100.4 F (38 C) BP: 100/71 and MAP: 80  Medications reviewed; 20 mg IV Pepcid x1 dose today, sliding scale Novolog, 200 mcg Synthroid per NGT/day, daily multivitamin with minerals, 40 mg IV Protonix/day, 10 mg IV vitamin K x1 run today. Labs reviewed; CBGs:  107, 82, and 85 mg/dL this AM, Cl: 100 mmol/L, BUN: 26 mg/dL, creatinine: 1.81 mg/dL, Ca: 6.7 mg/dL, LFTs elevated, Phos: 4.9 mg/dL, GFR: 39 mL/min.   IVF: NS @ 100 mL/hr.  Drips: Vaso @ 0.03 u/min, Fentanyl @ 50 mcg/hr, Neo @ 90 mcg/min, Levo @ 25 mcg/min.     11/15 - Pt with hypoxia and concern for hypoventilation this AM.  - Subsequently intubated shortly after 9 AM today d/t respiratory insufficiency.  - CV HD cath placed and pt to start on CRRT today. - Nephrology states "acute renal failure due to tumor lysis syndrome."  - Pt was recently dx with B cell lymphoma.  - Estimated nutrition needs updated based on intubation.  - NGT in place to R nare and CXR report states that tip of tube is in the stomach.   Patient is currently intubated on ventilator support MV: 10 L/min Temp (24hrs), Avg:98.3 F (36.8 C), Min:97.5 F (36.4 C), Max:99.5 F (37.5 C) Propofol: none BP: 87/64 and MAP: 71  Lab; Phos: 10.2 mg/dL IVF: NS @ 100 mL/hr.  Drips: Precedex @ 0.5 mcg/kg/hr, Levo @ 35 mcg/min.    11/13 - Pt in room with mother at bedside.  - Visibly distended stomach.  - Poor appetite for a few months d/t N/V.  - She was not eating anything during this time period. - Today she is feeling hungry and ready to eat.  - Diet was advanced to regular diet.  - She was drinking Boost drinks the past 2 weeks PTA. RD to order Boost Plus.  - Prior to developing N/V a  few months ago, she was "loving to eat" with good appetite. - Pt states she had 1/2 sub sandwich yesterday and tolerated this. - Per chart review, pt has lost 19 lb since 10/2 (7% wt loss x 1.5 months, significant for time frame).      Diet Order:  No diet orders on file  EDUCATION NEEDS:   No education needs have been identified at this time  Skin:  Skin Assessment: Reviewed RN Assessment  Last BM:  11/15  Height:   Ht Readings from Last 1 Encounters:  06/23/17 5\' 6"  (1.676 m)    Weight:   Wt Readings from  Last 1 Encounters:  06/24/17 259 lb 7.7 oz (117.7 kg)    Ideal Body Weight:  59.1 kg  BMI:  Body mass index is 41.88 kg/m.  Estimated Nutritional Needs:   Kcal:  2353-6144 (22-25 kcalkg IBW)  Protein:  >/= 160 grams (2.7 grams/kg IBW)  Fluid:  2.1L/day      Jarome Matin, MS, RD, LDN, CNSC Inpatient Clinical Dietitian Pager # [phone] After hours/weekend pager # [phone]

## 2017-06-24 NOTE — Progress Notes (Signed)
Patricia Mcclain   DOB:11/26/1975   XI#:[id number]    Subjective: The patient is intubated, ventilated and sedated.  Her mother is by the bedside.  Labs parameters are improving since hemodialysis has been started but she have signs of progressive liver failure with coagulopathy and new thrombocytopenia  Objective:  Vitals:   06/24/17 0730 06/24/17 0800  BP:    Pulse: 78 78  Resp: (!) 22 (!) 23  Temp: 99.7 F (37.6 C) 99.3 F (37.4 C)  SpO2: 100% 100%     Intake/Output Summary (Last 24 hours) at 06/24/2017 0843 Last data filed at 06/24/2017 0800 Gross per 24 hour  Intake 3823.34 ml  Output 1386 ml  Net 2437.34 ml    GENERAL: Sedated and intubated SKIN: skin color, texture, turgor are normal, no rashes or significant lesions EYES: Noted scleral icterus LUNGS: Scattered bilateral wheezes HEART: regular rate & rhythm and no murmurs and no lower extremity edema ABDOMEN:abdomen soft, distended Musculoskeletal:no cyanosis of digits and no clubbing  NEURO: Sedated, unable to assess   Labs:  Lab Results  Component Value Date   WBC 8.6 06/24/2017   HGB 14.7 06/24/2017   HCT 45.1 06/24/2017   MCV 79.4 06/24/2017   PLT 62 (L) 06/24/2017   NEUTROABS 8.0 (H) 06/22/2017    Lab Results  Component Value Date   NA 136 06/24/2017   K 4.2 06/24/2017   CL 100 (L) 06/24/2017   CO2 18 (L) 06/24/2017    Studies:  Dg Abd 1 View  Result Date: 06/23/2017 CLINICAL DATA:  NG tube placement. EXAM: ABDOMEN - 1 VIEW COMPARISON:  CT 06/06/2017 . FINDINGS: NG tube noted with tip below left hemidiaphragm. No bowel distention. No free air. Basilar atelectasis . IMPRESSION: NG tube noted with tip below left hemidiaphragm . Electronically Signed   By: Marcello Moores  Register   On: 06/23/2017 10:33   US Renal  Result Date: 06/22/2017 CLINICAL DATA:  Acute renal failure. EXAM: RENAL / URINARY TRACT ULTRASOUND COMPLETE COMPARISON:  CT abdomen pelvis dated June 06, 2017. 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 Length: 14.3 cm. Echogenicity within normal limits. No mass or hydronephrosis visualized. Mild prominence of the collecting system in the inferior pole, similar to prior CT. Left Kidney: Absent kidney in the left renal fossa. Bladder: Appears normal for degree of bladder distention. IMPRESSION: Cross fused renal ectopia.  No acute abnormality. Electronically Signed   By: Titus Dubin M.D.   On: 06/22/2017 12:49   Dg Chest Port 1 View  Result Date: 06/24/2017 CLINICAL DATA:  Hypoxia EXAM: PORTABLE CHEST 1 VIEW COMPARISON:  June 23, 2017 FINDINGS: Endotracheal tube tip is 3.3 cm above the carina. Central catheter tips are at the cavoatrial junction. Nasogastric tube tip and side port below the diaphragm. No pneumothorax. There is mild right base atelectasis. Lungs elsewhere are clear. Heart is upper normal in size with pulmonary vascularity within normal limits. No adenopathy. No bone lesions. IMPRESSION: Tube and catheter positions as described without evident pneumothorax. Slight right base atelectasis. Lungs elsewhere clear. Stable cardiac silhouette. Electronically Signed   By: Lowella Grip III M.D.   On: 06/24/2017 07:16   Dg Chest Port 1 View  Result Date: 06/23/2017 CLINICAL DATA:  Central line placement EXAM: PORTABLE CHEST 1 VIEW COMPARISON:  Yesterday FINDINGS: Left IJ central line with tip at the upper cavoatrial junction. No pneumothorax or mediastinal widening. Right-sided porta catheter with tip at the upper cavoatrial junction. Endotracheal tube  tip at the clavicular heads. An orogastric tube reaches the stomach. Mild atelectasis at the right base. IMPRESSION: 1. New central line without complicating feature. 2. Mild atelectasis at the right base. Electronically Signed   By: Monte Fantasia M.D.   On: 06/23/2017 10:39   Dg Chest Port 1 View  Result Date: 06/22/2017 CLINICAL DATA:  Acute pulmonary edema EXAM: PORTABLE CHEST 1 VIEW  COMPARISON:  PA and lateral chest x-ray of May 13, 2017 FINDINGS: The lungs are well-expanded. There is no focal infiltrate. There is no pleural effusion. The heart and pulmonary vascularity are normal. The mediastinum is normal in width. The power port catheter tip projects over the distal third of the SVC. IMPRESSION: There is no acute cardiopulmonary abnormality. Electronically Signed   By: David  Martinique M.D.   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 Bone marrow biopsyis pending, performed on 06/21/17 Port placementcompleted on 06/21/17 She has tumor lysis syndrome after receiving 2 daily doses of dexamethasone on 11/12 and 11/13 With progressive liver failure, it is conceivable that the disease is progressing rapidly and without treatment, it would only lead to death I had extensive discussion with the patient's mother and she authorized her to be treated I plan to increase IV dexamethasone to 20 mg daily starting today and over the weekend She should be able to tolerate IV rituximab regardless of liver or kidney dysfunction I have discussed with the pharmacist I will split the dose of the rituximab, to give 400 mg today and to give the rest of the dose next week I will kept the infusion rate at 50 mg/h I have discussed this with the chemo certified nursing staff at 3 W. I will omit Tylenol given liver failure We will monitor labs daily for tumor lysis syndrome over the weekend If her bilirubin improved next week to under 5, I would give her reduced dose chemotherapy next week The risks, benefits, side effects of treatment is discussed with the patient's mother and she agreed to proceed  Cancer associated pain Stable on Dilaudid  Jaundice, progressive liver failure She has extensive liverinvolvement withsigns ofliver failure Appreciate close follow-up by GI service  Coagulopathy with  elevated INR and thrombocytopenia Consider vitamin K if INR continues to trend up tomorrow No need for platelet transfusion unless she have signs of bleeding or platelet count less than 20,000  Solitary kidney, worsening renal failure, tumor lysis syndrome I would defer to nephrologist for further management.   The patient is started on hemodialysis on June 23, 2017 We will monitor serum uric acid level carefully  Insulin dependent diabetes Continue insulin sliding scale  CODE STATUS Full code  Discharge planning and goals of care  The patient's mother is now the dedicated medical healthcare power of attorney She understood that the patient is very sick and would likely die even with aggressive supportive care if her disease continued to progress  I will continue to monitor her status closely My colleague will see her over the weekend and I will resume care on Monday Please call consult service if questions arise Heath Lark, MD 06/24/2017  8:43 AM

## 2017-06-24 NOTE — Progress Notes (Signed)
PM Calcium, Albumin and Potassium results called to Nephrology, Dr Joelyn Oms.

## 2017-06-24 NOTE — Progress Notes (Signed)
  Echocardiogram 2D Echocardiogram limited has been performed.  Tresa Res 06/24/2017, 3:40 PM

## 2017-06-25 DIAGNOSIS — Z9911 Dependence on respirator [ventilator] status: Secondary | ICD-10-CM

## 2017-06-25 DIAGNOSIS — J918 Pleural effusion in other conditions classified elsewhere: Secondary | ICD-10-CM

## 2017-06-25 DIAGNOSIS — Z992 Dependence on renal dialysis: Secondary | ICD-10-CM

## 2017-06-25 LAB — BLOOD GAS, ARTERIAL
Acid-base deficit: 11.2 mmol/L — ABNORMAL HIGH (ref 0.0–2.0)
BICARBONATE: 14.5 mmol/L — AB (ref 20.0–28.0)
Drawn by: 257701
FIO2: 30
MECHVT: 470 mL
O2 SAT: 96.8 %
PATIENT TEMPERATURE: 98.6
PEEP/CPAP: 5 cmH2O
PH ART: 7.264 — AB (ref 7.350–7.450)
PO2 ART: 107 mmHg (ref 83.0–108.0)
RATE: 24 resp/min
pCO2 arterial: 33.1 mmHg (ref 32.0–48.0)

## 2017-06-25 LAB — RENAL FUNCTION PANEL
Albumin: 1.7 g/dL — ABNORMAL LOW (ref 3.5–5.0)
Anion gap: 24 — ABNORMAL HIGH (ref 5–15)
BUN: 21 mg/dL — ABNORMAL HIGH (ref 6–20)
CALCIUM: 5.7 mg/dL — AB (ref 8.9–10.3)
CHLORIDE: 93 mmol/L — AB (ref 101–111)
CO2: 18 mmol/L — AB (ref 22–32)
CREATININE: 1.05 mg/dL — AB (ref 0.44–1.00)
GFR calc Af Amer: >60 mL/min (ref >60)
GFR calc non Af Amer: >60 mL/min (ref >60)
GLUCOSE: 121 mg/dL — AB (ref 65–99)
Phosphorus: 6.5 mg/dL — ABNORMAL HIGH (ref 2.5–4.6)
Potassium: 4.3 mmol/L (ref 3.5–5.1)
SODIUM: 135 mmol/L (ref 135–145)

## 2017-06-25 LAB — COMPREHENSIVE METABOLIC PANEL
ALK PHOS: 523 U/L — AB (ref 38–126)
ALT: 151 U/L — AB (ref 14–54)
AST: 571 U/L — AB (ref 15–41)
Albumin: 1.7 g/dL — ABNORMAL LOW (ref 3.5–5.0)
Anion gap: 21 — ABNORMAL HIGH (ref 5–15)
BILIRUBIN TOTAL: 7 mg/dL — AB (ref 0.3–1.2)
BUN: 21 mg/dL — AB (ref 6–20)
CALCIUM: 6.6 mg/dL — AB (ref 8.9–10.3)
CO2: 11 mmol/L — ABNORMAL LOW (ref 22–32)
CREATININE: 1.11 mg/dL — AB (ref 0.44–1.00)
Chloride: 102 mmol/L (ref 101–111)
Glucose, Bld: 142 mg/dL — ABNORMAL HIGH (ref 65–99)
Potassium: 6.9 mmol/L (ref 3.5–5.1)
Sodium: 134 mmol/L — ABNORMAL LOW (ref 135–145)
Total Protein: 5.2 g/dL — ABNORMAL LOW (ref 6.5–8.1)

## 2017-06-25 LAB — URINE CULTURE

## 2017-06-25 LAB — GLUCOSE, CAPILLARY
GLUCOSE-CAPILLARY: 124 mg/dL — AB (ref 65–99)
GLUCOSE-CAPILLARY: 138 mg/dL — AB (ref 65–99)
GLUCOSE-CAPILLARY: 96 mg/dL (ref 65–99)
Glucose-Capillary: 109 mg/dL — ABNORMAL HIGH (ref 65–99)
Glucose-Capillary: 127 mg/dL — ABNORMAL HIGH (ref 65–99)
Glucose-Capillary: 104 mg/dL — ABNORMAL HIGH (ref 65–99)

## 2017-06-25 LAB — PROTIME-INR
INR: 1.49
Prothrombin Time: 17.9 seconds — ABNORMAL HIGH (ref 11.4–15.2)

## 2017-06-25 LAB — CBC
HEMATOCRIT: 41.1 % (ref 36.0–46.0)
HEMOGLOBIN: 12.6 g/dL (ref 12.0–15.0)
MCH: 25 pg — AB (ref 26.0–34.0)
MCHC: 30.7 g/dL (ref 30.0–36.0)
MCV: 81.7 fL (ref 78.0–100.0)
Platelets: 41 10*3/uL — ABNORMAL LOW (ref 150–400)
RBC: 5.03 MIL/uL (ref 3.87–5.11)
WBC: 14 10*3/uL — ABNORMAL HIGH (ref 4.0–10.5)

## 2017-06-25 LAB — BASIC METABOLIC PANEL
BUN: 22 mg/dL — ABNORMAL HIGH (ref 6–20)
CO2: 15 mmol/L — ABNORMAL LOW (ref 22–32)
CREATININE: 1.27 mg/dL — AB (ref 0.44–1.00)
Calcium: 6.5 mg/dL — ABNORMAL LOW (ref 8.9–10.3)
Chloride: 97 mmol/L — ABNORMAL LOW (ref 101–111)
GFR calc non Af Amer: 51 mL/min — ABNORMAL LOW (ref >60)
GFR, EST AFRICAN AMERICAN: 59 mL/min — AB (ref >60)
Glucose, Bld: 122 mg/dL — ABNORMAL HIGH (ref 65–99)
POTASSIUM: 5.2 mmol/L — AB (ref 3.5–5.1)
SODIUM: 134 mmol/L — AB (ref 135–145)

## 2017-06-25 LAB — URIC ACID: URIC ACID, SERUM: 4.6 mg/dL (ref 2.3–6.6)

## 2017-06-25 LAB — APTT: APTT: 48 s — AB (ref 24–36)

## 2017-06-25 LAB — PHOSPHORUS: Phosphorus: 6.9 mg/dL — ABNORMAL HIGH (ref 2.5–4.6)

## 2017-06-25 LAB — MAGNESIUM: MAGNESIUM: 2.5 mg/dL — AB (ref 1.7–2.4)

## 2017-06-25 MED ORDER — HEPARIN SODIUM (PORCINE) 1000 UNIT/ML DIALYSIS
1000.0000 [IU] | INTRAMUSCULAR | Status: DC | PRN
  Filled 2017-06-25: qty 6

## 2017-06-25 MED ORDER — SODIUM BICARBONATE 8.4 % IV SOLN
100.0000 meq | INTRAVENOUS | Status: AC
  Administered 2017-06-25: 100 meq via INTRAVENOUS

## 2017-06-25 MED ORDER — SODIUM CHLORIDE 0.9 % IV SOLN
1.0000 g | Freq: Once | INTRAVENOUS | Status: AC
  Administered 2017-06-25: 1 g via INTRAVENOUS
  Filled 2017-06-25: qty 10

## 2017-06-25 MED ORDER — STERILE WATER FOR INJECTION IV SOLN
INTRAVENOUS | Status: DC
  Administered 2017-06-25: 16:00:00 via INTRAVENOUS
  Filled 2017-06-25 (×4): qty 850

## 2017-06-25 MED ORDER — STERILE WATER FOR INJECTION IV SOLN
INTRAVENOUS | Status: DC
  Administered 2017-06-25 – 2017-06-26 (×9): via INTRAVENOUS_CENTRAL
  Filled 2017-06-25 (×16): qty 150

## 2017-06-25 MED ORDER — SODIUM POLYSTYRENE SULFONATE 15 GM/60ML PO SUSP
30.0000 g | Freq: Once | ORAL | Status: AC
  Administered 2017-06-25: 30 g via RECTAL
  Filled 2017-06-25: qty 120

## 2017-06-25 MED ORDER — ALTEPLASE 2 MG IJ SOLR: 2.0000 mg | Freq: Once | INTRAMUSCULAR | Status: DC | PRN

## 2017-06-25 MED ORDER — PRISMASOL BGK 0/2.5 32-2.5 MEQ/L IV SOLN
INTRAVENOUS | Status: DC
  Administered 2017-06-25 – 2017-06-26 (×8): via INTRAVENOUS_CENTRAL
  Filled 2017-06-25 (×12): qty 5000

## 2017-06-25 MED ORDER — STERILE WATER FOR INJECTION IV SOLN
INTRAVENOUS | Status: DC
  Administered 2017-06-25 – 2017-06-26 (×14): via INTRAVENOUS_CENTRAL
  Filled 2017-06-25 (×18): qty 150
  Filled 2017-06-25: qty 100
  Filled 2017-06-25 (×8): qty 150

## 2017-06-25 MED ORDER — STERILE WATER FOR INJECTION IV SOLN: INTRAVENOUS | Status: DC

## 2017-06-25 MED ORDER — SODIUM BICARBONATE 8.4 % IV SOLN
INTRAVENOUS | Status: AC
  Filled 2017-06-25: qty 100

## 2017-06-25 MED ORDER — SODIUM BICARBONATE 8.4 % IV SOLN
INTRAVENOUS | Status: DC
  Administered 2017-06-25: 05:00:00 via INTRAVENOUS
  Filled 2017-06-25: qty 850

## 2017-06-25 MED ORDER — SODIUM CHLORIDE 0.9 % FOR CRRT: INTRAVENOUS_CENTRAL | Status: DC | PRN

## 2017-06-25 NOTE — Progress Notes (Signed)
eLink Physician-Brief Progress Note Patient Name: Patricia Mcclain DOB: 13-Jan-1976 MRN: [id number]   Date of Service  06/25/2017  HPI/Events of Note  Hyperkalemia noted with potassium 6.9. Creatinine 1.1. Acidosis previously addressed with bicarbonate drip. Calcium 6.6. Transaminitis also noted.   eICU Interventions  1. Ordering calcium gluconate 1 g IV 2. Kayexalate 30 g rectal enema 1 3. Follow-up ABG ordered 4. Continuing bicarbonate drip 5. Repeat basic metabolic panel at 10 AM 6. Continuous telemetry monitoring      Intervention Category Major Interventions: Electrolyte abnormality - evaluation and management  Tera Partridge 06/25/2017, 6:12 AM

## 2017-06-25 NOTE — Progress Notes (Signed)
PULMONARY / CRITICAL CARE MEDICINE   Name: Patricia Mcclain MRN: [id number] DOB: 11/22/1975    ADMISSION DATE:  06/11/2017 CONSULTATION DATE:  11/14  REFERRING MD:  Dr. Krishnan, G.   CHIEF COMPLAINT:  Encephalopathy  HISTORY OF PRESENT ILLNESS:   41-year-old unfortunate obese woman with IDDM, hypothyroidism with recent diagnosis of large B-cell lymphoma with liver involvement, developed tumor lysis syndrome after 2 doses of IV steroids, admitted 11/12 and intubated 11/14 for encephalopathy and shock. She was  started on CRRT 11/15  STUDIES:  11/5 Liver biopsy >>> 11/13 Bone marrow biopsy >>>  CULTURES: resp 11/16 >> Urine 11/16 >> bld 11/16 >>   ANTIBIOTICS: Cefepime 11/16 >> Vanc 11/16 >>  SIGNIFICANT EVENTS: 11/12 initiation of IV steroids 11/13: Rasburicase  Administered on 11/12 and 11/14 11/13 port placed 11/14 tumor lysis syndrome suspected 11/15 encephalopathic, transfer to ICU 11/16 rituxan  LINES/TUBES: Port 11/13 > [facility] HD cath 11/15 >>  SUBJECTIVE:   Sedated , on vent, critically ill No obvious pain Some breakthrough agitation this am Remains on 3 pressors, CRRT  VITAL SIGNS: BP (!) 78/46   Pulse 79   Temp 99.1 F (37.3 C)   Resp 18   Ht 5' 6" (1.676 m)   Wt 277 lb 9 oz (125.9 kg)   LMP 06/07/2017   SpO2 99%   BMI 44.80 kg/m   HEMODYNAMICS: CVP:  [9 mmHg-11 mmHg] 9 mmHg  VENTILATOR SETTINGS: Vent Mode: PRVC FiO2 (%):  [30 %] 30 % Set Rate:  [24 bmp] 24 bmp Vt Set:  [470 mL] 470 mL PEEP:  [5 cmH20] 5 cmH20 Plateau Pressure:  [16 cmH20-21 cmH20] 16 cmH20  INTAKE / OUTPUT: I/O last 3 completed shifts: In: 10777.6 [I.V.:5717.6; Other:80; NG/GT:330; IV Piggyback:4650] Out: 3699 [Urine:35; Emesis/NG output:200; Other:3439; Stool:25]  PHYSICAL EXAMINATION: General: young woman sedated on ventilator currently on CRRT and multiple pressors HEENT: Oral ETT scleral jaundice mucous membrane moist left IJ dialysis catheter  Pulmonary/chest:  Clear to auscultation decreased bases right port  Cardiac: Regular rate and rhythm without murmur rub or gallop Abdomen: Distended, soft, nontender ,  GU: Concentrated dark urine Neuro/psych: Sedated, moves all extremities   LABS:  BMET Recent Labs  Lab 06/24/17 0508 06/24/17 1715 06/25/17 0352  NA 136 136 134*  K 4.2 5.0 6.9*  CL 100* 104 102  CO2 18* 12* 11*  BUN 26* 22* 21*  CREATININE 1.81* 1.51* 1.11*  GLUCOSE 90 115* 142*    Electrolytes Recent Labs  Lab 06/24/17 0508 06/24/17 1715 06/25/17 0352  CALCIUM 6.7* 6.4* 6.6*  MG 2.2 2.2 2.5*  PHOS 4.9* 5.4* 6.9*    CBC Recent Labs  Lab 06/22/17 0810 06/23/17 0416 06/24/17 0508  WBC 10.7* 8.7 8.6  HGB 13.2 12.8 14.7  HCT 41.6 40.2 45.1  PLT 196 190 62*    Coag's Recent Labs  Lab 06/22/17 0810 06/23/17 1232 06/24/17 0508 06/25/17 0352  APTT 38*  --  45* 48*  INR 1.33 2.04 3.72  --     Sepsis Markers Recent Labs  Lab 06/22/17 0934 06/22/17 1500 06/24/17 0915  LATICACIDVEN 3.9* 4.6*  --   PROCALCITON  --   --  36.48    ABG Recent Labs  Lab 06/24/17 0406 06/25/17 0409 06/25/17 0650  PHART 7.298* 7.159* 7.178*  PCO2ART 39.0 26.8* 24.0*  PO2ART 110* 122* 117*    Liver Enzymes Recent Labs  Lab 06/23/17 0416  06/24/17 0508 06/24/17 1715 06/25/17 0352  AST 2,117*  --    413*  --  571*  ALT 415*  --  190*  --  151*  ALKPHOS 613*  --  666*  --  523*  BILITOT 6.4*  --  6.9*  --  7.0*  ALBUMIN 2.2*   < > 2.2* 1.9* 1.7*   < > = values in this interval not displayed.    Cardiac Enzymes No results for input(s): TROPONINI, PROBNP in the last 168 hours.  Glucose Recent Labs  Lab 06/24/17 1123 06/24/17 1538 06/24/17 1956 06/24/17 2323 06/25/17 0345 06/25/17 0818  GLUCAP 88 109* 115* 106* 138* 124*    Imaging No results found. Reviewed 11/16 film    DISCUSSION:  Multi organ failure , on CRRT SHock , doubt sepsis  ASSESSMENT / PLAN:  PULMONARY A: Ventilator dependence  in setting of severe metabolic acidosis and circulatory shock  P:   Continue full ventilator support VAP protocol   CARDIOVASCULAR A:  Circulatory shock; doubt sepsis, likely vasodilatory History HTN P:  Continue to titrate vasoactive drips for mean arterial pressure goal greater than 65 Telemetry Follow CVp with goal 10-12 range   RENAL A:   Acute kidney injury -  likely secondary to tumor lysis Metabolic acidosis -worsening Hyperkalemia P:   Continue CRRT per nephrology -low K & bicarb bath Ct bicarb gtt @ 100, added overnight Can stop once pH reaches 7.30 & above Check ABG q 8h  GASTROINTESTINAL A:   Transaminitis - likely secondary to TLS however; this is improved greatly since starting CRRT Oncology also raises concern that this could also be related to the lymphoma in general.  P:   Ct trickle tube feeds PPI for SBP Continue to trend LFTs  HEMATOLOGIC/Oncologic A:   Diffuse large B-cell lymphoma. Recent Dx. Staging incomplete;  got 2 doses of Decadron, complicated by Tumor lysis syndrome has received 2 doses of Rasburicase;  Worsening thrombocytopenia Coagulopathy P:  Decadron 20 mg daily x 5ds Rituxan given 11/16  vitamin K given - check CBC & coags daily SCDs to lower extremities Holding subcu heparin   INFECTIOUS A:   Elevated pro-calcitonin, no obvious souce P:   Started  empiric antibiotics  ENDOCRINE A:   IDDM Hypothyroid P:   Sliding scale insulin Synthroid replacement  NEUROLOGIC A:   Acute metabolic vs toxic encephalopathy P:   PAD protocol with RASS goal -2 fent gtt, versed prn  FAMILY  - Updates: mother at bedside  - Inter-disciplinary family meet or Palliative Care meeting due by:  11/17, no CPR issued, ct other forms of life support, family understands guarded prognosis  The patient is critically ill with multiple organ systems failure and requires high complexity decision making for assessment and support, frequent  evaluation and titration of therapies, application of advanced monitoring technologies and extensive interpretation of multiple databases. Critical Care Time devoted to patient care services described in this note independent of APP time is 35 minutes.    Rakesh V. Alva  Rakesh Alva MD. FCCP. [facility] Pager [phone] If no response call [phone]   06/25/2017      

## 2017-06-25 NOTE — Progress Notes (Signed)
Pt's mother and aunt were bedside when I arrived. Another aunt joined at the conclusion of our visit. Pt's mom said she was doing ok, and the doctor told them everything. She said pt has a 42 year old daughter but they do not want her to see her mom like this. She said she has told pt's daughter that her mom is very sick and they are hoping she will get better. Pt's aunt said she doesn't feel pt's daughter knows enough about what is really going on w/pt. Pt's mother does not have faith community for support, but she wanted prayer. Pt's mother was well composed, but visibly concerned. Ch offered continued support if needed. pls page [place], [place]   06/25/17 1000  Clinical Encounter Type  Visited With [facility]

## 2017-06-25 NOTE — Progress Notes (Signed)
eLink Physician-Brief Progress Note Patient Name: Patricia Mcclain DOB: April 19, 1976 MRN: [id number]   Date of Service  06/25/2017  HPI/Events of Note  Notified of acidosis on morning ABG. PH 7.159 with PCO2 26.8. Currently on mechanical ventilation. Normal saline infusion at 100 mL per hour.   eICU Interventions  1. Switching to sodium bicarbonate drip at 100 mL per hour 2. Repeat ABG at 7 AM      Intervention Category Major Interventions: Acid-Base disturbance - evaluation and management  Tera Partridge 06/25/2017, 4:53 AM

## 2017-06-25 NOTE — Progress Notes (Signed)
KASIDI SHANKER   DOB:[birth year]   YK#:[id number]   BLT#:[id number]  Subjective:  Patient is intubated, unresponsive to voice and touch; mother and father in room   Objective: young African American woman examined in bed Vitals:   06/25/17 0600 06/25/17 0700  BP: (!) 90/42 110/69  Pulse: 78 83  Resp: 11 16  Temp: 98.8 F (37.1 C) 99.5 F (37.5 C)  SpO2: 99% 100%    Body mass index is 44.8 kg/m.  Intake/Output Summary (Last 24 hours) at 06/25/2017 0810 Last data filed at 06/25/2017 0700 Gross per 24 hour  Intake 8439.85 ml  Output 2818 ml  Net 5621.85 ml       Lungs no rales or wheezes--auscultated anterolaterally  Heart regular rate and rhythm    CBG (last 3)  Recent Labs    06/24/17 1956 06/24/17 2323 06/25/17 0345  GLUCAP 115* 106* 138*     Labs:  Lab Results  Component Value Date   WBC 8.6 06/24/2017   HGB 14.7 06/24/2017   HCT 45.1 06/24/2017   MCV 79.4 06/24/2017   PLT 62 (L) 06/24/2017   NEUTROABS 8.0 (H) 06/22/2017   Uric acid 4.6  @LASTCHEMISTRY @  Urine Studies No results for input(s): UHGB, CRYS in the last 72 hours.  Invalid input(s): UACOL, UAPR, USPG, UPH, UTP, UGL, [place], UBIL, UNIT, UROB, [place], [place], [place]  Basic Metabolic Panel: Recent Labs  Lab 06/23/17 0416 06/23/17 1649 06/24/17 0508 06/24/17 1715 06/25/17 0352  NA 137 136 136 136 134*  K 4.6 4.6 4.2 5.0 6.9*  CL 98* 98* 100* 104 102  CO2 21* 21* 18* 12* 11*  GLUCOSE 245* 193* 90 115* 142*  BUN 42* 36* 26* 22* 21*  CREATININE 2.96* 2.46* 1.81* 1.51* 1.11*  CALCIUM 6.5* 6.3* 6.7* 6.4* 6.6*  MG 2.0  --  2.2 2.2 2.5*  PHOS 10.2* 7.9* 4.9* 5.4* 6.9*   GFR Estimated Creatinine Clearance: 89.5 mL/min (A) (by C-G formula based on SCr of 1.11 mg/dL (H)). Liver Function Tests: Recent Labs  Lab 06/22/17 0810 06/22/17 1112 06/23/17 0416 06/23/17 1649 06/24/17 0508 06/24/17 1715 06/25/17 0352  AST 2,245* 2,347* 2,117*  --  413*  --  571*  ALT  446* 461* 415*  --  190*  --  151*  ALKPHOS 409* 437* 613*  --  666*  --  523*  BILITOT 6.0* 5.8* 6.4*  --  6.9*  --  7.0*  PROT 6.5 6.5 6.0*  --  6.2*  --  5.2*  ALBUMIN 2.5* 2.4* 2.2* 2.3* 2.2* 1.9* 1.7*   No results for input(s): LIPASE, AMYLASE in the last 168 hours. Recent Labs  Lab 06/23/17 0900  AMMONIA 47*   Coagulation profile Recent Labs  Lab 06/21/17 0359 06/22/17 0810 06/23/17 1232 06/24/17 0508  INR 1.11 1.33 2.04 3.72    CBC: Recent Labs  Lab 06/16/2017 1233 06/21/17 0359 06/22/17 0810 06/23/17 0416 06/24/17 0508  WBC 11.6* 10.1 10.7* 8.7 8.6  NEUTROABS 9.2* 7.9* 8.0*  --   --   HGB 14.1 12.7 13.2 12.8 14.7  HCT 42.5 40.5 41.6 40.2 45.1  MCV 78.1 79.3 80.8 79.3 79.4  PLT PLATELET CLUMPS NOTED ON SMEAR, COUNT APPEARS ADEQUATE 168 196 190 62*   Cardiac Enzymes: No results for input(s): CKTOTAL, CKMB, CKMBINDEX, TROPONINI in the last 168 hours. BNP: Invalid input(s): POCBNP CBG: Recent Labs  Lab 06/24/17 1123 06/24/17 1538 06/24/17 1956 06/24/17 2323 06/25/17 0345  GLUCAP 88 109* 115*  106* 138*   D-Dimer No results for input(s): DDIMER in the last 72 hours. Hgb A1c No results for input(s): HGBA1C in the last 72 hours. Lipid Profile No results for input(s): CHOL, HDL, LDLCALC, TRIG, CHOLHDL, LDLDIRECT in the last 72 hours. Thyroid function studies No results for input(s): TSH, T4TOTAL, T3FREE, THYROIDAB in the last 72 hours.  Invalid input(s): FREET3 Anemia work up No results for input(s): VITAMINB12, FOLATE, FERRITIN, TIBC, IRON, RETICCTPCT in the last 72 hours. Microbiology Recent Results (from the past 240 hour(s))  MRSA PCR Screening     Status: None   Collection Time: 06/22/17  9:50 AM  Result Value Ref Range Status   MRSA by PCR NEGATIVE NEGATIVE Final    Comment:        The GeneXpert MRSA Assay (FDA approved for NASAL specimens only), is one component of a comprehensive MRSA colonization surveillance program. It is not intended  to diagnose MRSA infection nor to guide or monitor treatment for MRSA infections.   Culture, respiratory (NON-Expectorated)     Status: None (Preliminary result)   Collection Time: 06/24/17 11:42 AM  Result Value Ref Range Status   Specimen Description TRACHEAL ASPIRATE  Final   Special Requests Immunocompromised  Final   Gram Stain   Final    RARE WBC PRESENT,BOTH PMN AND MONONUCLEAR NO ORGANISMS SEEN Performed at [facility], [place] [address], [place], [place] [address]    Culture PENDING  Incomplete   Report Status PENDING  Incomplete      Studies:  Dg Abd 1 View  Result Date: 06/23/2017 CLINICAL DATA:  NG tube placement. EXAM: ABDOMEN - 1 VIEW COMPARISON:  CT 06/06/2017 . FINDINGS: NG tube noted with tip below left hemidiaphragm. No bowel distention. No free air. Basilar atelectasis . IMPRESSION: NG tube noted with tip below left hemidiaphragm . Electronically Signed   By: Marcello Moores  Register   On: 06/23/2017 10:33   Dg Chest Port 1 View  Result Date: 06/24/2017 CLINICAL DATA:  Hypoxia EXAM: PORTABLE CHEST 1 VIEW COMPARISON:  June 23, 2017 FINDINGS: Endotracheal tube tip is 3.3 cm above the carina. Central catheter tips are at the cavoatrial junction. Nasogastric tube tip and side port below the diaphragm. No pneumothorax. There is mild right base atelectasis. Lungs elsewhere are clear. Heart is upper normal in size with pulmonary vascularity within normal limits. No adenopathy. No bone lesions. IMPRESSION: Tube and catheter positions as described without evident pneumothorax. Slight right base atelectasis. Lungs elsewhere clear. Stable cardiac silhouette. Electronically Signed   By: Lowella Grip III M.D.   On: 06/24/2017 07:16   Dg Chest Port 1 View  Result Date: 06/23/2017 CLINICAL DATA:  Central line placement EXAM: PORTABLE CHEST 1 VIEW COMPARISON:  Yesterday FINDINGS: Left IJ central line with tip at the upper cavoatrial junction. No pneumothorax or mediastinal  widening. Right-sided porta catheter with tip at the upper cavoatrial junction. Endotracheal tube tip at the clavicular heads. An orogastric tube reaches the stomach. Mild atelectasis at the right base. IMPRESSION: 1. New central line without complicating feature. 2. Mild atelectasis at the right base. Electronically Signed   By: Monte Fantasia M.D.   On: 06/23/2017 10:39    Assessment: 41 y.o. [place] woman with diffuse large cell B-cell non-Hodgkin's lymphoma diagnosed through liver biopsy 06/13/2017, bone marrow biopsy 11/13 negative, started dexamethasone 06/23/2017, received curtailed-dose rituximab 06/24/2017, prognosis compromised by PE,  liver failure and tumor lysis syndrome, currently intubated, on CRRT   Plan:  I have  discussed the overall situation with the patient's parents. They understand the diagnosis, the complications, and the fact that right now things do not seem to be moving in the right direction. We discussed advanced directives and they are clear they do not want CPR-- order written.  The want to continue maximal support (intubation, CRRT, pressors) so long as it is not futile. It is understandably difficult for them to accept what may be inevitable as we are dealing with a young woman who was previously healthy. I will ask chaplain service to assist  Appreciate renal, critical care and ICU staff's help to this aptient and her family   Chauncey Cruel, MD 06/25/2017  8:10 AM Medical Oncology and Hematology [facility] [place], [place] [address] Tel. [phone]    Fax. [phone]

## 2017-06-25 NOTE — Progress Notes (Signed)
[facility] Progress Note  Subjective: worse overnight, on 2 pressors, worsening met acidosis and hyperkalemia in spite of CRRT. +5L yest, CVP remains 9  Vitals:   06/25/17 0400 06/25/17 0500 06/25/17 0600 06/25/17 0700  BP: 110/79 91/63 (!) 90/42 110/69  Pulse: 80 81 78 83  Resp: 11 10 11 16   Temp: 97.9 F (36.6 C) 98.4 F (36.9 C) 98.8 F (37.1 C) 99.5 F (37.5 C)  TempSrc:      SpO2: 100% 100% 99% 100%  Weight:  125.9 kg (277 lb 9 oz)    Height:        Inpatient medications: . acyclovir  400 mg Oral Daily  . chlorhexidine  15 mL Mouth Rinse BID  . Chlorhexidine Gluconate Cloth  6 each Topical Daily  . dexamethasone  20 mg Intravenous Q24H  . feeding supplement (PRO-STAT SUGAR FREE 64)  60 mL Per Tube BID  . feeding supplement (VITAL HIGH PROTEIN)  1,000 mL Per Tube Q24H  . fentaNYL (SUBLIMAZE) injection  50 mcg Intravenous Once  . insulin aspart  0-15 Units Subcutaneous Q4H  . levothyroxine  200 mcg Oral QAC breakfast  . mouth rinse  15 mL Mouth Rinse q12n4p  . multivitamin with minerals  1 tablet Oral Daily  . pantoprazole (PROTONIX) IV  40 mg Intravenous Q24H  . sertraline  200 mg Oral Daily  . sodium chloride flush  10-40 mL Intracatheter Q12H   . sodium chloride    . sodium chloride    . ceFEPime (MAXIPIME) IV Stopped (06/24/17 2329)  . famotidine    . fentaNYL infusion INTRAVENOUS 50 mcg/hr (06/24/17 1100)  . norepinephrine (LEVOPHED) Adult infusion 35 mcg/min (06/25/17 0100)  . ondansetron (ZOFRAN) IV    . phenylephrine (NEO-SYNEPHRINE) Adult infusion 180 mcg/min (06/25/17 0712)  . dialysate (PRISMASATE)    .  sodium bicarbonate (isotonic) infusion in sterile water 100 mL/hr at 06/25/17 0522  . sodium bicarbonate (isotonic) 1000 mL infusion    . sodium bicarbonate (isotonic) 1000 mL infusion    . sodium chloride    . vancomycin    . vasopressin (PITRESSIN) infusion - *FOR SHOCK* 0.03 Units/min (06/24/17 1035)   Place/Maintain arterial line  **AND** sodium chloride, sodium chloride, acetaminophen, albuterol, alteplase, alteplase, alum & mag hydroxide-simeth, benzocaine, diphenhydrAMINE, diphenhydrAMINE, diphenhydrAMINE, EPINEPHrine, EPINEPHrine, EPINEPHrine, EPINEPHrine, famotidine, fentaNYL, fentaNYL (SUBLIMAZE) injection, fentaNYL (SUBLIMAZE) injection, guaiFENesin-dextromethorphan, heparin, heparin lock flush, heparin lock flush, hydrocortisone, lidocaine-prilocaine, methylPREDNISolone sodium succinate, morphine injection, ondansetron **OR** ondansetron **OR** ondansetron (ZOFRAN) IV **OR** ondansetron (ZOFRAN) IV, senna-docusate, sodium chloride, sodium chloride flush, sodium chloride flush, sodium chloride flush  Exam: Gen sedated on the vent, unresponsive No jvd Chest clear ant and lat RRR no MRG Abd obese, large liver down 8 cm, distended, dec'd BS Ext - diffuse 2+ LE edema dependent areas Neuro is sedated and poorly responsive  CXR - clear, no active disease  Impression: 1. Acute renal failure - due to shock +/- TLS. Worse today overall with worsening acidemia/hyperkalemia.  Will change RF's to bicarb and lower K+ to 2 in dialysate. Poor prognosis.  2. Shock - severe, worse, not responding to IVF's (+5L yest) 3.  Solitary kidney, congenital - baseline creat 0.7 4. 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 ^LFT"s - d/t #3 6. DM - per primary   Plan - poor prognosis , have d/w pt's mom this am.  Cont CRRT for now, await ONC and CCM rec's today.    Kelly Splinter MD [facility] pager [phone]   06/25/2017, 7:54 AM   Recent Labs  Lab 06/24/17 0508 06/24/17 1715 06/25/17 0352  NA 136 136 134*  K 4.2 5.0 6.9*  CL 100* 104 102  CO2 18* 12* 11*  GLUCOSE 90 115* 142*  BUN 26* 22* 21*  CREATININE 1.81* 1.51* 1.11*  CALCIUM 6.7* 6.4* 6.6*  PHOS 4.9* 5.4* 6.9*   Recent Labs  Lab 06/23/17 0416  06/24/17 0508 06/24/17 1715 06/25/17 0352  AST 2,117*  --  413*  --  571*   ALT 415*  --  190*  --  151*  ALKPHOS 613*  --  666*  --  523*  BILITOT 6.4*  --  6.9*  --  7.0*  PROT 6.0*  --  6.2*  --  5.2*  ALBUMIN 2.2*   < > 2.2* 1.9* 1.7*   < > = values in this interval not displayed.   Recent Labs  Lab 06/19/2017 1233 06/21/17 0359 06/22/17 0810 06/23/17 0416 06/24/17 0508  WBC 11.6* 10.1 10.7* 8.7 8.6  NEUTROABS 9.2* 7.9* 8.0*  --   --   HGB 14.1 12.7 13.2 12.8 14.7  HCT 42.5 40.5 41.6 40.2 45.1  MCV 78.1 79.3 80.8 79.3 79.4  PLT PLATELET CLUMPS NOTED ON SMEAR, COUNT APPEARS ADEQUATE 168 196 190 62*   Iron/TIBC/Ferritin/ %Sat No results found for: IRON, TIBC, FERRITIN, IRONPCTSAT

## 2017-06-25 NOTE — Progress Notes (Signed)
CRITICAL VALUE ALERT  Critical Value: Calcium 5.7  Date & Time Notied:  06/25/17 1745  Provider Notified: Dr. [facility] Soho  Orders Received/Actions taken: 1 gram Calcium gluconate, AM ionized Ca lab draw order

## 2017-06-26 ENCOUNTER — Inpatient Hospital Stay (HOSPITAL_COMMUNITY): Payer: Medicaid Other

## 2017-06-26 DIAGNOSIS — J9600 Acute respiratory failure, unspecified whether with hypoxia or hypercapnia: Secondary | ICD-10-CM

## 2017-06-26 DIAGNOSIS — C8339 Diffuse large B-cell lymphoma, extranodal and solid organ sites: Secondary | ICD-10-CM

## 2017-06-26 DIAGNOSIS — N179 Acute kidney failure, unspecified: Secondary | ICD-10-CM

## 2017-06-26 LAB — CBC WITH DIFFERENTIAL/PLATELET
BASOS ABS: 0.1 10*3/uL (ref 0.0–0.1)
BASOS PCT: 1 %
EOS PCT: 1 %
Eosinophils Absolute: 0.1 10*3/uL (ref 0.0–0.7)
HEMATOCRIT: 32.6 % — AB (ref 36.0–46.0)
Hemoglobin: 10.2 g/dL — ABNORMAL LOW (ref 12.0–15.0)
LYMPHS PCT: 5 %
Lymphs Abs: 0.5 10*3/uL — ABNORMAL LOW (ref 0.7–4.0)
MCH: 26 pg (ref 26.0–34.0)
MCHC: 31.3 g/dL (ref 30.0–36.0)
MCV: 83.2 fL (ref 78.0–100.0)
MONO ABS: 0.5 10*3/uL (ref 0.1–1.0)
MONOS PCT: 5 %
NEUTROS PCT: 88 %
Neutro Abs: 9.3 10*3/uL — ABNORMAL HIGH (ref 1.7–7.7)
Platelets: 13 10*3/uL — CL (ref 150–400)
RBC: 3.92 MIL/uL (ref 3.87–5.11)
WBC: 10.5 10*3/uL (ref 4.0–10.5)
nRBC: 44 /100 WBC — ABNORMAL HIGH

## 2017-06-26 LAB — COMPREHENSIVE METABOLIC PANEL
ALBUMIN: 1.5 g/dL — AB (ref 3.5–5.0)
ALT: 780 U/L — AB (ref 14–54)
AST: 6346 U/L — AB (ref 15–41)
Alkaline Phosphatase: 773 U/L — ABNORMAL HIGH (ref 38–126)
Anion gap: 29 — ABNORMAL HIGH (ref 5–15)
BUN: 20 mg/dL (ref 6–20)
CHLORIDE: 82 mmol/L — AB (ref 101–111)
CO2: 24 mmol/L (ref 22–32)
CREATININE: 0.86 mg/dL (ref 0.44–1.00)
Calcium: 5.5 mg/dL — CL (ref 8.9–10.3)
GFR calc Af Amer: >60 mL/min (ref >60)
GLUCOSE: 117 mg/dL — AB (ref 65–99)
Potassium: 4.1 mmol/L (ref 3.5–5.1)
Sodium: 135 mmol/L (ref 135–145)
Total Bilirubin: 7.9 mg/dL — ABNORMAL HIGH (ref 0.3–1.2)
Total Protein: 4.7 g/dL — ABNORMAL LOW (ref 6.5–8.1)

## 2017-06-26 LAB — RENAL FUNCTION PANEL
ANION GAP: 29 — AB (ref 5–15)
ANION GAP: 32 — AB (ref 5–15)
Albumin: 1.5 g/dL — ABNORMAL LOW (ref 3.5–5.0)
Albumin: 1.2 g/dL — ABNORMAL LOW (ref 3.5–5.0)
BUN: 20 mg/dL (ref 6–20)
BUN: 15 mg/dL (ref 6–20)
CALCIUM: 5.7 mg/dL — AB (ref 8.9–10.3)
CHLORIDE: 82 mmol/L — AB (ref 101–111)
CO2: 23 mmol/L (ref 22–32)
CO2: 19 mmol/L — AB (ref 22–32)
CREATININE: 0.91 mg/dL (ref 0.44–1.00)
Calcium: 5.5 mg/dL — CL (ref 8.9–10.3)
Chloride: 82 mmol/L — ABNORMAL LOW (ref 101–111)
Creatinine, Ser: 0.62 mg/dL (ref 0.44–1.00)
GFR calc non Af Amer: >60 mL/min (ref >60)
GLUCOSE: 122 mg/dL — AB (ref 65–99)
Glucose, Bld: 115 mg/dL — ABNORMAL HIGH (ref 65–99)
PHOSPHORUS: 8.2 mg/dL — AB (ref 2.5–4.6)
POTASSIUM: 4.1 mmol/L (ref 3.5–5.1)
POTASSIUM: 5.1 mmol/L (ref 3.5–5.1)
Phosphorus: 7.4 mg/dL — ABNORMAL HIGH (ref 2.5–4.6)
SODIUM: 133 mmol/L — AB (ref 135–145)
Sodium: 134 mmol/L — ABNORMAL LOW (ref 135–145)

## 2017-06-26 LAB — GLUCOSE, CAPILLARY
GLUCOSE-CAPILLARY: 168 mg/dL — AB (ref 65–99)
GLUCOSE-CAPILLARY: 111 mg/dL — AB (ref 65–99)
GLUCOSE-CAPILLARY: 54 mg/dL — AB (ref 65–99)
GLUCOSE-CAPILLARY: 104 mg/dL — AB (ref 65–99)
GLUCOSE-CAPILLARY: 116 mg/dL — AB (ref 65–99)
GLUCOSE-CAPILLARY: 14 mg/dL — AB (ref 65–99)
Glucose-Capillary: 57 mg/dL — ABNORMAL LOW (ref 65–99)
Glucose-Capillary: 27 mg/dL — CL (ref 65–99)
Glucose-Capillary: 98 mg/dL (ref 65–99)

## 2017-06-26 LAB — LACTATE DEHYDROGENASE: LDH: >10000 U/L — ABNORMAL HIGH (ref 98–192)

## 2017-06-26 LAB — CBC
HCT: 36.6 % (ref 36.0–46.0)
Hemoglobin: 11.6 g/dL — ABNORMAL LOW (ref 12.0–15.0)
MCH: 25.8 pg — ABNORMAL LOW (ref 26.0–34.0)
MCHC: 31.7 g/dL (ref 30.0–36.0)
MCV: 81.3 fL (ref 78.0–100.0)
PLATELETS: 7 10*3/uL — AB (ref 150–400)
RBC: 4.5 MIL/uL (ref 3.87–5.11)
RDW: 30.2 % — AB (ref 11.5–15.5)
WBC: 11.2 10*3/uL — AB (ref 4.0–10.5)

## 2017-06-26 LAB — MAGNESIUM: MAGNESIUM: 2.2 mg/dL (ref 1.7–2.4)

## 2017-06-26 LAB — CULTURE, RESPIRATORY W GRAM STAIN

## 2017-06-26 LAB — URIC ACID: URIC ACID, SERUM: 5 mg/dL (ref 2.3–6.6)

## 2017-06-26 LAB — PHOSPHORUS: PHOSPHORUS: 7.4 mg/dL — AB (ref 2.5–4.6)

## 2017-06-26 LAB — APTT: APTT: 60 s — AB (ref 24–36)

## 2017-06-26 LAB — PROTIME-INR
INR: 1.79
PROTHROMBIN TIME: 20.6 s — AB (ref 11.4–15.2)

## 2017-06-26 LAB — ABO/RH: ABO/RH(D): A NEG

## 2017-06-26 LAB — CALCIUM: Calcium: 5.9 mg/dL — CL (ref 8.9–10.3)

## 2017-06-26 LAB — CULTURE, RESPIRATORY

## 2017-06-26 MED ORDER — SODIUM CHLORIDE 0.9 % IV BOLUS (SEPSIS)
500.0000 mL | Freq: Once | INTRAVENOUS | Status: AC
  Administered 2017-06-26: 500 mL via INTRAVENOUS

## 2017-06-26 MED ORDER — SODIUM CHLORIDE 0.9 % IV BOLUS (SEPSIS): 500.0000 mL | Freq: Once | INTRAVENOUS | Status: DC

## 2017-06-26 MED ORDER — DEXTROSE 50 % IV SOLN
INTRAVENOUS | Status: AC
  Administered 2017-06-26: 100 mL via INTRAVENOUS
  Filled 2017-06-26: qty 100

## 2017-06-26 MED ORDER — HEPARIN SOD (PORK) LOCK FLUSH 100 UNIT/ML IV SOLN
500.0000 [IU] | Freq: Every day | INTRAVENOUS | Status: DC | PRN
Start: 2017-06-26 — End: 2017-06-27
  Filled 2017-06-26: qty 5

## 2017-06-26 MED ORDER — CALCIUM GLUCONATE 10 % IV SOLN: 4.0000 g | INTRAVENOUS | Status: DC

## 2017-06-26 MED ORDER — HEPARIN SOD (PORK) LOCK FLUSH 100 UNIT/ML IV SOLN
250.0000 [IU] | INTRAVENOUS | Status: DC | PRN
  Filled 2017-06-26: qty 2.5

## 2017-06-26 MED ORDER — SODIUM CHLORIDE 0.9% FLUSH: 3.0000 mL | INTRAVENOUS | Status: DC | PRN

## 2017-06-26 MED ORDER — DEXTROSE 50 % IV SOLN
2.0000 | Freq: Once | INTRAVENOUS | Status: AC
  Administered 2017-06-26 (×2): 100 mL via INTRAVENOUS

## 2017-06-26 MED ORDER — DEXTROSE 50 % IV SOLN
INTRAVENOUS | Status: AC
  Administered 2017-06-26: 50 mL
  Filled 2017-06-26: qty 50

## 2017-06-26 MED ORDER — SODIUM CHLORIDE 0.9% FLUSH: 10.0000 mL | INTRAVENOUS | Status: DC | PRN

## 2017-06-26 MED ORDER — SODIUM CHLORIDE 0.9 % IV SOLN: 250.0000 mL | Freq: Once | INTRAVENOUS | Status: DC

## 2017-06-26 MED ORDER — DEXTROSE 50 % IV SOLN
INTRAVENOUS | Status: AC
  Administered 2017-06-26: 50 mL
  Filled 2017-06-26: qty 100

## 2017-06-26 MED ORDER — SODIUM CHLORIDE 0.9 % IV SOLN
4.0000 g | Freq: Once | INTRAVENOUS | Status: DC
  Filled 2017-06-26: qty 40

## 2017-06-26 MED ORDER — PRISMASOL BGK 0/2.5 32-2.5 MEQ/L IV SOLN: INTRAVENOUS | Status: DC

## 2017-06-26 MED ORDER — HEPARIN SOD (PORK) LOCK FLUSH 100 UNIT/ML IV SOLN
500.0000 [IU] | Freq: Every day | INTRAVENOUS | Status: DC | PRN
  Filled 2017-06-26: qty 5

## 2017-06-26 MED ORDER — SODIUM CHLORIDE 0.9 % IV SOLN
4.0000 g | INTRAVENOUS | Status: AC
  Administered 2017-06-26: 4 g via INTRAVENOUS
  Filled 2017-06-26: qty 40

## 2017-06-26 MED ORDER — CALCIUM GLUCONATE 10 % IV SOLN: 4.0000 g | Freq: Once | INTRAVENOUS | Status: DC

## 2017-06-26 MED ORDER — PRISMASOL BGK 0/2.5 32-2.5 MEQ/L IV SOLN
INTRAVENOUS | Status: DC
  Administered 2017-06-26 (×4): via INTRAVENOUS_CENTRAL
  Filled 2017-06-26 (×13): qty 5000

## 2017-06-26 MED ORDER — DEXTROSE 10 % IV SOLN
INTRAVENOUS | Status: DC
  Administered 2017-06-26: 13:00:00 via INTRAVENOUS
  Filled 2017-06-26: qty 1000

## 2017-06-27 LAB — BLOOD GAS, ARTERIAL
ACID-BASE DEFICIT: 5.7 mmol/L — AB (ref 0.0–2.0)
ACID-BASE DEFICIT: 4.4 mmol/L — AB (ref 0.0–2.0)
Acid-base deficit: 18.5 mmol/L — ABNORMAL HIGH (ref 0.0–2.0)
Acid-base deficit: 18.6 mmol/L — ABNORMAL HIGH (ref 0.0–2.0)
BICARBONATE: 8.7 mmol/L — AB (ref 20.0–28.0)
BICARBONATE: 18.9 mmol/L — AB (ref 20.0–28.0)
BICARBONATE: 19.8 mmol/L — AB (ref 20.0–28.0)
Bicarbonate: 9.2 mmol/L — ABNORMAL LOW (ref 20.0–28.0)
DRAWN BY: 51425
DRAWN BY: 514251
Drawn by: 225631
Drawn by: 51425
FIO2: 30
FIO2: 30
FIO2: 30
FIO2: 30
LHR: 24 {breaths}/min
MECHVT: 470 mL
O2 SAT: 95.8 %
O2 Saturation: 97 %
O2 Saturation: 97 %
O2 Saturation: 97.1 %
PATIENT TEMPERATURE: 97.5
PCO2 ART: 24 mmHg — AB (ref 32.0–48.0)
PCO2 ART: 36.3 mmHg (ref 32.0–48.0)
PEEP/CPAP: 5 cmH2O
PEEP/CPAP: 5 cmH2O
PEEP: 5 cmH2O
PEEP: 5 cmH2O
PH ART: 7.159 — AB (ref 7.350–7.450)
PH ART: 7.178 — AB (ref 7.350–7.450)
PH ART: 7.363 (ref 7.350–7.450)
PO2 ART: 122 mmHg — AB (ref 83.0–108.0)
PO2 ART: 117 mmHg — AB (ref 83.0–108.0)
Patient temperature: 97.6
Patient temperature: 98.6
Patient temperature: 98.6
RATE: 24 resp/min
RATE: 24 resp/min
RATE: 24 resp/min
VT: 470 mL
VT: 470 mL
VT: 470 mL
pCO2 arterial: 26.8 mmHg — ABNORMAL LOW (ref 32.0–48.0)
pCO2 arterial: 35.8 mmHg (ref 32.0–48.0)
pH, Arterial: 7.337 — ABNORMAL LOW (ref 7.350–7.450)
pO2, Arterial: 109 mmHg — ABNORMAL HIGH (ref 83.0–108.0)
pO2, Arterial: 97.8 mmHg (ref 83.0–108.0)

## 2017-06-27 LAB — CALCIUM, IONIZED: Calcium, Ionized, Serum: <3 mg/dL — ABNORMAL LOW (ref 4.5–5.6)

## 2017-06-28 ENCOUNTER — Telehealth: Payer: Self-pay

## 2017-06-28 LAB — BPAM PLATELET PHERESIS
Blood Product Expiration Date: 201811202359
Unit Type and Rh: 6200

## 2017-06-28 LAB — PREPARE PLATELET PHERESIS: Unit division: 0

## 2017-06-28 NOTE — Telephone Encounter (Signed)
On 06/28/17 I received a d/c from Teresita (original). The d/c is for burial. The patient is a patient of Doctor Elsworth Soho. The d/c will be taken to Pulmonary Unit @ [facility] this pm for signature.  On 06/28/17 I received the d/c back from Doctor Elsworth Soho. I got the d/c ready and called the funeral home to let them know the d/c is ready for pickup.

## 2017-06-29 LAB — CULTURE, BLOOD (ROUTINE X 2)
CULTURE: NO GROWTH
CULTURE: NO GROWTH
SPECIAL REQUESTS: ADEQUATE
Special Requests: ADEQUATE

## 2017-07-07 ENCOUNTER — Encounter (HOSPITAL_COMMUNITY): Payer: Self-pay

## 2017-07-07 LAB — TISSUE HYBRIDIZATION TO NCBH

## 2017-07-09 NOTE — Progress Notes (Signed)
Dr. Elsworth Soho spoke with Patricia Mcclain "father" and george wants Tanai not to be shocked and to be a DNR.  Dr. El[facility] Soho informed this writer to change in her to chart to reflect those wishes.  Irven [place], RN

## 2017-07-09 NOTE — Progress Notes (Signed)
Dr. Elsworth Soho said to not transfuse platelets.  Will inform Dr. Jana Hakim.  Irven [place], RN

## 2017-07-09 NOTE — Progress Notes (Signed)
Pt's aunt was bedside when I arrived. She said pt's parents had gone home to shower and change and will return shortly. Pt's aunt said she is ok and did not need anything presently. I will make a return visit shortly. Please page if needed prior to that time. Crystal, MDiv   Jul 01, 2017 1000  Clinical Encounter Type  Visited With Family

## 2017-07-09 NOTE — Progress Notes (Signed)
Hypoglycemic Event  CBG: 57  Treatment: D50 IV 50 mL  Symptoms: Shaky  Follow-up CBG: FIEP:[id number]  CBG Result:168  Possible Reasons for Event: Unknown  Comments/MD notified:Will continue to monitor    Patricia Mcclain, Coralee Pesa

## 2017-07-09 NOTE — Progress Notes (Signed)
Pt glucose at 1222 was 27.  Admin 2 amp of D50 and started D10 per Dr. [facility] Soho.  Irven [place], RN

## 2017-07-09 NOTE — Progress Notes (Addendum)
Pharmacy Consult - Electrolyte - Calcium Replacement  Labs: Ca 5.5, albumin 1.5 - Corrected calcium = 7.5  Goal per renal: unadjusted Calcium > 6.8  A/P: Renal Md already ordered 4g calcium gluconate this AM already - will continue to follow with you  Adrian Saran, PharmD, BCPS Pager [phone] Jul 14, 2017 8:41 AM

## 2017-07-09 NOTE — Progress Notes (Signed)
[facility] arrived very shortly after pt passed. Parents were bedside of pt; pt's mom seemed stunned and later said it hasn't hit me yet. Pt's dad walked away for a moment to contain his emotions and left the room briefly. I allowed time for them to visit. Pt's mom said they brought pt's 42 yr old daughter earlier and said she had to leave the room as soon as she saw her mom and later came back. Pt's mom said pt's daughter asked if her mom was going to die and pt's mom said she told her daughter she did not know; they were hoping she would get better. I told them about and gave them a brochure to [facility]  Pt's mom and dad were very appreciative of support, information and prayer shawl. I remained by them for support during their visit. Patrick, [place]   July 01, 2017 2100  Clinical Encounter Type  Visited With Family

## 2017-07-09 NOTE — Progress Notes (Signed)
eLink Physician-Brief Progress Note Patient Name: Patricia Mcclain DOB: January 20, 1976 MRN: [id number]   Date of Service  07/09/17  HPI/Events of Note  Hypotension - BP = 59/45 on maximal doses of Phenylephrine, Norepinephrine and Vasopressin. Patient is a partial code - family wants everything except CPR.  eICU Interventions  Will order: 1. ABG now.      Intervention Category Major Interventions: Hypotension - evaluation and management  Tanner Yeley Eugene 07/09/2017, 5:14 AM

## 2017-07-09 NOTE — Progress Notes (Signed)
CRITICAL VALUE ALERT  Critical Value:  Platelet level 7  Date & Time Notied:  July 03, 2017 0730  Provider Notified: Dr. Jana Hakim  Orders Received/Actions taken: 1 unit of platelets ordered

## 2017-07-09 NOTE — Progress Notes (Signed)
CRITICAL VALUE ALERT  Critical Value:  Calcium 5.5  Date & Time Notied:  06/30/2017 0730  Provider Notified: Dr. Jonnie Finner  Orders Received/Actions taken: Calcium gluconate 4g ordered

## 2017-07-09 NOTE — Progress Notes (Signed)
JENNETTA FLOOD   DOB:[birth year]   DT#:[id number]   DXI#:[id number]  Subjective:  Patient remains intubated, sedated; does not respond to voice or touch; father and mother in room   Objective: young African American woman examined in bed Vitals:   06/29/17 0700 06/29/17 0804  BP:    Pulse: 88   Resp: 15   Temp: (!) 97.5 F (36.4 C)   SpO2: 93% 98%    Body mass index is 45.9 kg/m.  Intake/Output Summary (Last 24 hours) at Jun 29, 2017 0812 Last data filed at June 29, 2017 0800 Gross per 24 hour  Intake 7605.74 ml  Output 3729 ml  Net 3876.74 ml      Eyes open but does not track Lungs no rales/ wheezes, auscultated anterolaterally Heart RRR abd soft    CBG (last 3)  Recent Labs    2017-06-29 0428 06/29/2017 0525 2017-06-29 0757  GLUCAP 168* 111* 54*   LDH was >2K on 11/12-- will repeat Uric acid 5.0  Labs:  Lab Results  Component Value Date   WBC 11.2 (H) 2017/06/29   HGB 11.6 (L) 2017/06/29   HCT 36.6 06/29/17   MCV 81.3 06-29-2017   PLT 7 (LL) 06/29/17   NEUTROABS 8.0 (H) 06/22/2017   Uric acid 4.6  @LASTCHEMISTRY @  Urine Studies No results for input(s): UHGB, CRYS in the last 72 hours.  Invalid input(s): UACOL, UAPR, USPG, UPH, UTP, UGL, UKET, UBIL, UNIT, [place], [place]  Basic Metabolic Panel: Recent Labs  Lab 06/23/17 0416  06/24/17 0508 06/24/17 1715 06/25/17 0352 06/25/17 1013 06/25/17 1701 06/29/17 0431  NA 137   < > 136 136 134* 134* 135 135  134*  K 4.6   < > 4.2 5.0 6.9* 5.2* 4.3 4.1  4.1  CL 98*   < > 100* 104 102 97* 93* 82*  82*  CO2 21*   < > 18* 12* 11* 15* 18* 24  23  GLUCOSE 245*   < > 90 115* 142* 122* 121* 117*  115*  BUN 42*   < > 26* 22* 21* 22* 21* 20  20  CREATININE 2.96*   < > 1.81* 1.51* 1.11* 1.27* 1.05* 0.86  0.91  CALCIUM 6.5*   < > 6.7* 6.4* 6.6* 6.5* 5.7* 5.5*  5.5*  MG 2.0  --  2.2 2.2 2.5*  --   --  2.2  PHOS 10.2*   < > 4.9* 5.4* 6.9*  --  6.5* 7.4*  7.4*   < > =  values in this interval not displayed.   GFR Estimated Creatinine Clearance: 117.3 mL/min (by C-G formula based on SCr of 0.86 mg/dL). Liver Function Tests: Recent Labs  Lab 06/22/17 1112 06/23/17 0416  06/24/17 0508 06/24/17 1715 06/25/17 0352 06/25/17 1701 06-29-17 0431  AST 2,347* 2,117*  --  413*  --  571*  --  6,346*  ALT 461* 415*  --  190*  --  151*  --  780*  ALKPHOS 437* 613*  --  666*  --  523*  --  773*  BILITOT 5.8* 6.4*  --  6.9*  --  7.0*  --  7.9*  PROT 6.5 6.0*  --  6.2*  --  5.2*  --  4.7*  ALBUMIN 2.4* 2.2*   < > 2.2* 1.9* 1.7* 1.7* 1.5*  1.5*   < > = values in this interval not displayed.   No results for input(s): LIPASE, AMYLASE in the last 168 hours. Recent  Labs  Lab 06/23/17 0900  AMMONIA 47*   Coagulation profile Recent Labs  Lab 06/22/17 0810 06/23/17 1232 06/24/17 0508 06/25/17 1037 07/02/17 0431  INR 1.33 2.04 3.72 1.49 1.79    CBC: Recent Labs  Lab 06/19/2017 1233 06/21/17 0359 06/22/17 0810 06/23/17 0416 06/24/17 0508 06/25/17 1037 07-02-2017 0431  WBC 11.6* 10.1 10.7* 8.7 8.6 14.0* 11.2*  NEUTROABS 9.2* 7.9* 8.0*  --   --   --   --   HGB 14.1 12.7 13.2 12.8 14.7 12.6 11.6*  HCT 42.5 40.5 41.6 40.2 45.1 41.1 36.6  MCV 78.1 79.3 80.8 79.3 79.4 81.7 81.3  PLT PLATELET CLUMPS NOTED ON SMEAR, COUNT APPEARS ADEQUATE 168 196 190 62* 41* 7*   Cardiac Enzymes: No results for input(s): CKTOTAL, CKMB, CKMBINDEX, TROPONINI in the last 168 hours. BNP: Invalid input(s): POCBNP CBG: Recent Labs  Lab 06/25/17 2337 Jul 02, 2017 0410 07/02/2017 0428 07-02-2017 0525 July 02, 2017 0757  GLUCAP 96 57* 168* 111* 54*   D-Dimer No results for input(s): DDIMER in the last 72 hours. Hgb A1c No results for input(s): HGBA1C in the last 72 hours. Lipid Profile No results for input(s): CHOL, HDL, LDLCALC, TRIG, CHOLHDL, LDLDIRECT in the last 72 hours. Thyroid function studies No results for input(s): TSH, T4TOTAL, T3FREE, THYROIDAB in the last 72  hours.  Invalid input(s): FREET3 Anemia work up No results for input(s): VITAMINB12, FOLATE, FERRITIN, TIBC, IRON, RETICCTPCT in the last 72 hours. Microbiology Recent Results (from the past 240 hour(s))  MRSA PCR Screening     Status: None   Collection Time: 06/22/17  9:50 AM  Result Value Ref Range Status   MRSA by PCR NEGATIVE NEGATIVE Final    Comment:        The GeneXpert MRSA Assay (FDA approved for NASAL specimens only), is one component of a comprehensive MRSA colonization surveillance program. It is not intended to diagnose MRSA infection nor to guide or monitor treatment for MRSA infections.   Culture, Urine     Status: Abnormal   Collection Time: 06/24/17 10:17 AM  Result Value Ref Range Status   Specimen Description URINE, CATHETERIZED  Final   Special Requests Immunocompromised  Final   Culture (A)  Final    <10,000 COLONIES/mL INSIGNIFICANT GROWTH Performed at [facility], [address], [place], [place] [address]    Report Status 06/25/2017 FINAL  Final  Culture, blood (Routine X 2) w Reflex to ID Panel     Status: None (Preliminary result)   Collection Time: 06/24/17 11:19 AM  Result Value Ref Range Status   Specimen Description BLOOD RIGHT HAND  Final   Special Requests IN PEDIATRIC BOTTLE Blood Culture adequate volume  Final   Culture   Final    NO GROWTH 1 DAY Performed at [facility], [place] [address] [place], [place] [address]    Report Status PENDING  Incomplete  Culture, blood (Routine X 2) w Reflex to ID Panel     Status: None (Preliminary result)   Collection Time: 06/24/17 11:19 AM  Result Value Ref Range Status   Specimen Description BLOOD LEFT HAND  Final   Special Requests IN PEDIATRIC BOTTLE Blood Culture adequate volume  Final   Culture   Final    NO GROWTH 1 DAY Performed at [facility], [place] [address] [place], [place] [address]    Report Status PENDING  Incomplete  Culture, respiratory (NON-Expectorated)      Status: None (Preliminary result)  Collection Time: 06/24/17 11:42 AM  Result Value Ref Range Status   Specimen Description TRACHEAL ASPIRATE  Final   Special Requests Immunocompromised  Final   Gram Stain   Final    RARE WBC PRESENT,BOTH PMN AND MONONUCLEAR NO ORGANISMS SEEN Performed at [facility], [address] [place], [place] [address]    Culture FEW YEAST  Final   Report Status PENDING  Incomplete      Studies:  Dg Chest Port 1 View  Result Date: July 19, 2017 CLINICAL DATA:  Initial evaluation for acute respiratory failure. EXAM: PORTABLE CHEST 1 VIEW COMPARISON:  Prior radiograph from 08/24/2016. FINDINGS: Right-sided Port-A-Cath in place with tip overlying the cavoatrial junction. Left IJ approach central venous catheter stable in position with tip at the cavoatrial junction. Endotracheal tube in place with tip positioned 2.4 cm above the carina. Enteric tube courses in the the abdomen. Cardiac and mediastinal silhouettes are stable in size and contour, and remain within normal limits. Lungs are hypoinflated. Small right pleural effusion. Increasing hazy bibasilar opacities, right greater than left, which may reflect atelectasis or infiltrates. No pulmonary edema. No pneumothorax. Osseous structures unchanged. IMPRESSION: 1. Support apparatus in satisfactory position as above. 2. Interval development of small right pleural effusion with increased bibasilar opacities, likely atelectasis. Superimposed infiltrate at the right lung base could be considered in the correct clinical setting. Electronically Signed   By: Jeannine Boga M.D.   On: 07/19/17 04:47    Assessment: 41 y.o. 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   At this point we are dealing with significant inflammation in the liver presumably due to necrosis of the tumor there. It may take several days for the healthy remaining liver to start functioning and of course she may end up with an element of cirrhosis if she survives.  Platelets are being used up and I will transfuse to try to keep count >30K. Will also repeat LDH which will give [place] an idea where we are in the course of tumor lysis.  I have discussed the situation again with her parents. They would be willing to pull back support if further treatment were entirely hopeless but I do not feel that is where we are at this point. They are guardedly hopeful and are very appreciative of the help their daughter is receiving.  Will follow with you.   Chauncey Cruel, MD 2017/07/19  8:12 AM Medical Oncology and Hematology [facility] [address] [place], [place] [address] Tel. [phone]    Fax. [phone]

## 2017-07-09 NOTE — Progress Notes (Signed)
210 ml of fentanyl wasted in the sink, witnessed by Jennye Moccasin, RN.

## 2017-07-09 NOTE — Progress Notes (Signed)
CRITICAL VALUE ALERT  Critical Value:  Calcium 5.9  Date & Time Notied:  16-Jul-2017  1540  Provider Notified: Dr. Jonnie Finner  Orders Received/Actions taken: Dr Jonnie Finner said he is going to add 4g bag of calcium gluconate.

## 2017-07-09 NOTE — Discharge Summary (Signed)
PULMONARY / CRITICAL CARE MEDICINE   Name: Patricia Mcclain MRN: [id number] DOB: [birth year]    ADMISSION DATE:  06/12/2017 CONSULTATION DATE:  11/14  REFERRING MD:  Dr. [place] Pink, Darnell Level.   CHIEF COMPLAINT:  Encephalopathy  HISTORY OF PRESENT ILLNESS:   41 year old unfortunate obese woman with IDDM, hypothyroidism with recent diagnosis of large B-cell lymphoma with liver involvement, developed tumor lysis syndrome after 2 doses of IV steroids, admitted 11/12 and intubated 11/14 for encephalopathy and shock. She was  started on CRRT 11/15  STUDIES:  11/5 Liver biopsy >>>high grade B cell lymphoma 11/13 Bone marrow biopsy >>>atypical lymph aggregates  CULTURES: resp 11/16 >>ng Urine 11/16 >> bld 11/16 >>ng   ANTIBIOTICS: Cefepime 11/16 >> Vanc 11/16 >>  SIGNIFICANT EVENTS: 11/12 initiation of IV steroids 11/13: Rasburicase  Administered on 11/12 and 11/14 11/13 port placed 11/14 tumor lysis syndrome suspected 11/15 encephalopathic, transfer to ICU 11/16 rituxan  LINES/TUBES: Port 11/13 > [facility] HD cath 11/15 >>  COURSE :  She developed multiorgan failure.  She required mechanical ventilation and high-dose pressors for circulatory shock.  She required initiation of CRRT.  And bicarbonate drip for persistent acidosis.  She was given Decadron and a dose of Rituxan, oncology was actively involved in her care.  She was placed on empiric antibiotics due to elevated pro-calcitonin even though blood cultures were negative. Unfortunately she developed refractory shock and multiorgan failure and passed away on 07/02/2023.  Cause of death-multiorgan failure from tumor lysis syndrome and B-cell lymphoma   Rakesh V. Elsworth Soho  MD  06/28/2017

## 2017-07-09 NOTE — Progress Notes (Signed)
ABG for July 17, 2017 at 0800 to be cancelled per MD Elsworth Soho- utilize ABG from July 17, 2017 at approx 0545. RN aware.

## 2017-07-09 NOTE — Progress Notes (Signed)
[facility] Progress Note  Subjective: hypotension / shock is worse.  Ca levels down to 5.5.  Phos and uric acid levels ok.  AST ^^ > 6000.  ABG pH ok.  Wt's up as not pulling any fluid w CRRT due to severe hypotension.   Vitals:   2017-07-17 0630 2017-07-17 0645 07-17-17 0700 2017/07/17 0804  BP:      Pulse: (!) 103 87 88   Resp: 14 16 15    Temp: 97.7 F (36.5 C) (!) 97.5 F (36.4 C) (!) 97.5 F (36.4 C)   TempSrc:      SpO2: 96% 94% 93% 98%  Weight:   129 kg (284 lb 6.3 oz)   Height:        Inpatient medications: . acyclovir  400 mg Oral Daily  . chlorhexidine  15 mL Mouth Rinse BID  . Chlorhexidine Gluconate Cloth  6 each Topical Daily  . dexamethasone  20 mg Intravenous Q24H  . feeding supplement (PRO-STAT SUGAR FREE 64)  60 mL Per Tube BID  . feeding supplement (VITAL HIGH PROTEIN)  1,000 mL Per Tube Q24H  . fentaNYL (SUBLIMAZE) injection  50 mcg Intravenous Once  . insulin aspart  0-15 Units Subcutaneous Q4H  . levothyroxine  200 mcg Oral QAC breakfast  . mouth rinse  15 mL Mouth Rinse q12n4p  . multivitamin with minerals  1 tablet Oral Daily  . pantoprazole (PROTONIX) IV  40 mg Intravenous Q24H  . sertraline  200 mg Oral Daily  . sodium chloride flush  10-40 mL Intracatheter Q12H   . sodium chloride    . sodium chloride    . sodium chloride    . calcium gluconate    . ceFEPime (MAXIPIME) IV Stopped (06/25/17 2237)  . famotidine    . fentaNYL infusion INTRAVENOUS Stopped (2017-07-17 0444)  . norepinephrine (LEVOPHED) Adult infusion 40 mcg/min (2017/07/17 0800)  . ondansetron (ZOFRAN) IV    . phenylephrine (NEO-SYNEPHRINE) Adult infusion 400 mcg/min (07/17/2017 0800)  . dialysate (PRISMASATE) 1,800 mL/hr at 07/17/2017 0800  . sodium bicarbonate (isotonic) 1000 mL infusion 400 mL/hr at 17-Jul-2017 0829  . sodium bicarbonate (isotonic) 1000 mL infusion 200 mL/hr at 17-Jul-2017 0730  . sodium chloride    . vancomycin Stopped (06/25/17 1300)  . vasopressin (PITRESSIN)  infusion - *FOR SHOCK* 0.03 Units/min (17-Jul-2017 0800)   Place/Maintain arterial line **AND** sodium chloride, sodium chloride, acetaminophen, albuterol, alteplase, alum & mag hydroxide-simeth, benzocaine, diphenhydrAMINE, diphenhydrAMINE, diphenhydrAMINE, EPINEPHrine, EPINEPHrine, EPINEPHrine, EPINEPHrine, famotidine, fentaNYL, fentaNYL (SUBLIMAZE) injection, fentaNYL (SUBLIMAZE) injection, guaiFENesin-dextromethorphan, heparin, heparin lock flush, heparin lock flush, heparin lock flush, heparin lock flush, hydrocortisone, lidocaine-prilocaine, methylPREDNISolone sodium succinate, morphine injection, ondansetron **OR** ondansetron **OR** ondansetron (ZOFRAN) IV **OR** ondansetron (ZOFRAN) IV, senna-docusate, sodium chloride, sodium chloride flush, sodium chloride flush, sodium chloride flush, sodium chloride flush, sodium chloride flush  Exam: Gen sedated on the vent, unresponsive No jvd Chest clear ant and lat RRR no MRG Abd obese, large liver down 8 cm, distended, dec'd BS Ext - diffuse 2+ LE edema dependent areas Neuro is sedated and poorly responsive  CXR - mild new pulm edema   Impression: 1. Acute renal failure - due to shock +/- TLS. Acidosis, hyperphos and uric acid under control.  Serum Ca down markedly and possibly contributing to severe hypotension. Will replace acutely today.   2. Tumor lysis syndrome - as above.  3. Shock - not pulling fluid now due to persistent shock 4. Solitary kidney, congenital - baseline creat 0.7 5. 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 Now sp rituxan.  6. ^LFT"s - d/t #3 7. DM - per primary   Plan - 4 gm IV Ca gluc then repeat, get Ca up goal unadj > 6.5- 7. Cont CRRT, no UF until BP's better.   Kelly Splinter MD [facility] pager [phone]   2017/07/13, 8:32 AM   Recent Labs  Lab 06/25/17 0352 06/25/17 1013 06/25/17 1701 07-13-17 0431  NA 134* 134* 135 135  134*  K 6.9* 5.2* 4.3 4.1  4.1  CL  102 97* 93* 82*  82*  CO2 11* 15* 18* 24  23  GLUCOSE 142* 122* 121* 117*  115*  BUN 21* 22* 21* 20  20  CREATININE 1.11* 1.27* 1.05* 0.86  0.91  CALCIUM 6.6* 6.5* 5.7* 5.5*  5.5*  PHOS 6.9*  --  6.5* 7.4*  7.4*   Recent Labs  Lab 06/24/17 0508  06/25/17 0352 06/25/17 1701 Jul 13, 2017 0431  AST 413*  --  571*  --  6,346*  ALT 190*  --  151*  --  780*  ALKPHOS 666*  --  523*  --  773*  BILITOT 6.9*  --  7.0*  --  7.9*  PROT 6.2*  --  5.2*  --  4.7*  ALBUMIN 2.2*   < > 1.7* 1.7* 1.5*  1.5*   < > = values in this interval not displayed.   Recent Labs  Lab 07/05/2017 1233 06/21/17 0359 06/22/17 0810  06/24/17 0508 06/25/17 1037 2017/07/13 0431  WBC 11.6* 10.1 10.7*   < > 8.6 14.0* 11.2*  NEUTROABS 9.2* 7.9* 8.0*  --   --   --   --   HGB 14.1 12.7 13.2   < > 14.7 12.6 11.6*  HCT 42.5 40.5 41.6   < > 45.1 41.1 36.6  MCV 78.1 79.3 80.8   < > 79.4 81.7 81.3  PLT PLATELET CLUMPS NOTED ON SMEAR, COUNT APPEARS ADEQUATE 168 196   < > 62* 41* 7*   < > = values in this interval not displayed.   Iron/TIBC/Ferritin/ %Sat No results found for: IRON, TIBC, FERRITIN, IRONPCTSAT

## 2017-07-09 NOTE — Progress Notes (Signed)
PULMONARY / CRITICAL CARE MEDICINE   Name: Patricia Mcclain MRN: [id number] DOB: 1975/11/13    ADMISSION DATE:  06/09/2017 CONSULTATION DATE:  11/14  REFERRING MD:  Dr. Maryland Pink, Darnell Level.   CHIEF COMPLAINT:  Encephalopathy  HISTORY OF PRESENT ILLNESS:   41 year old unfortunate obese woman with IDDM, hypothyroidism with recent diagnosis of large B-cell lymphoma with liver involvement, developed tumor lysis syndrome after 2 doses of IV steroids, admitted 11/12 and intubated 11/14 for encephalopathy and shock. She was  started on CRRT 11/15  STUDIES:  11/5 Liver biopsy >>>high grade B cell lymphoma 11/13 Bone marrow biopsy >>>  CULTURES: resp 11/16 >>ng Urine 11/16 >> bld 11/16 >>ng   ANTIBIOTICS: Cefepime 11/16 >> Vanc 11/16 >>  SIGNIFICANT EVENTS: 11/12 initiation of IV steroids 11/13: Rasburicase  Administered on 11/12 and 11/14 11/13 port placed 11/14 tumor lysis syndrome suspected 11/15 encephalopathic, transfer to ICU 11/16 rituxan  LINES/TUBES: Port 11/13 > [facility] HD cath 11/15 >>  SUBJECTIVE:  Remains  on vent, critically ill on CRRT Some breakthrough agitation this am Maxed out on 3 pressors, BP 50s since 3am, 30s now  VITAL SIGNS: BP (!) 32/11   Pulse (!) 38   Temp (!) 96.4 F (35.8 C)   Resp 15   Ht 5' 6" (1.676 m)   Wt 284 lb 6.3 oz (129 kg)   LMP 06/07/2017   SpO2 95%   BMI 45.90 kg/m   HEMODYNAMICS:    VENTILATOR SETTINGS: Vent Mode: PRVC FiO2 (%):  [30 %] 30 % Set Rate:  [24 bmp] 24 bmp Vt Set:  [470 mL] 470 mL PEEP:  [5 cmH20] 5 cmH20 Plateau Pressure:  [17 cmH20-27 cmH20] 18 cmH20  INTAKE / OUTPUT: I/O last 3 completed shifts: In: 10016.7 [I.V.:8996.7; Other:60; NG/GT:740; IV Piggyback:220] Out: 8828 [Urine:18; MKLKJ:1791]  PHYSICAL EXAMINATION: General: young woman sedated on ventilator currently on CRRT and multiple pressors HEENT: Oral ETT scleral jaundice mucous membrane moist left IJ dialysis catheter  Pulmonary/chest: Clear to  auscultation decreased bases right port  Cardiac: Regular rate and rhythm without murmur rub or gallop Abdomen: Distended, soft, nontender ,  GU: Concentrated dark urine Neuro/psych: spont moves LUE, rt gaze  MSK - mottled BLEs with bullae  LABS:  BMET Recent Labs  Lab 06/25/17 1013 06/25/17 1701 18-Jul-2017 0431  NA 134* 135 135  134*  K 5.2* 4.3 4.1  4.1  CL 97* 93* 82*  82*  CO2 15* 18* 24  23  BUN 22* 21* 20  20  CREATININE 1.27* 1.05* 0.86  0.91  GLUCOSE 122* 121* 117*  115*    Electrolytes Recent Labs  Lab 06/24/17 1715 06/25/17 0352 06/25/17 1013 06/25/17 1701 2017/07/18 0431  CALCIUM 6.4* 6.6* 6.5* 5.7* 5.5*  5.5*  MG 2.2 2.5*  --   --  2.2  PHOS 5.4* 6.9*  --  6.5* 7.4*  7.4*    CBC Recent Labs  Lab 06/24/17 0508 06/25/17 1037 18-Jul-2017 0431  WBC 8.6 14.0* 11.2*  HGB 14.7 12.6 11.6*  HCT 45.1 41.1 36.6  PLT 62* 41* 7*    Coag's Recent Labs  Lab 06/24/17 0508 06/25/17 0352 06/25/17 1037 07/18/17 0431  APTT 45* 48*  --  60*  INR 3.72  --  1.49 1.79    Sepsis Markers Recent Labs  Lab 06/22/17 0934 06/22/17 1500 06/24/17 0915  LATICACIDVEN 3.9* 4.6*  --   PROCALCITON  --   --  36.48    ABG Recent Labs  Lab 06/25/17  1608 06/25/17 1945 07-24-2017 0526  PHART 7.264* 7.337* 7.363  PCO2ART 33.1 36.3 35.8  PO2ART 107 109* 97.8    Liver Enzymes Recent Labs  Lab 06/24/17 0508  06/25/17 0352 06/25/17 1701 07/24/17 0431  AST 413*  --  571*  --  6,346*  ALT 190*  --  151*  --  780*  ALKPHOS 666*  --  523*  --  773*  BILITOT 6.9*  --  7.0*  --  7.9*  ALBUMIN 2.2*   < > 1.7* 1.7* 1.5*  1.5*   < > = values in this interval not displayed.    Cardiac Enzymes No results for input(s): TROPONINI, PROBNP in the last 168 hours.  Glucose Recent Labs  Lab 06/25/17 2337 24-Jul-2017 0410 Jul 24, 2017 0428 Jul 24, 2017 0525 2017-07-24 0757 07-24-2017 0849  GLUCAP 96 57* 168* 111* 54* 104*    Imaging Dg Chest Port 1 View  Result Date:  July 24, 2017 CLINICAL DATA:  Initial evaluation for acute respiratory failure. EXAM: PORTABLE CHEST 1 VIEW COMPARISON:  Prior radiograph from 08/24/2016. FINDINGS: Right-sided Port-A-Cath in place with tip overlying the cavoatrial junction. Left IJ approach central venous catheter stable in position with tip at the cavoatrial junction. Endotracheal tube in place with tip positioned 2.4 cm above the carina. Enteric tube courses in the the abdomen. Cardiac and mediastinal silhouettes are stable in size and contour, and remain within normal limits. Lungs are hypoinflated. Small right pleural effusion. Increasing hazy bibasilar opacities, right greater than left, which may reflect atelectasis or infiltrates. No pulmonary edema. No pneumothorax. Osseous structures unchanged. IMPRESSION: 1. Support apparatus in satisfactory position as above. 2. Interval development of small right pleural effusion with increased bibasilar opacities, likely atelectasis. Superimposed infiltrate at the right lung base could be considered in the correct clinical setting. Electronically Signed   By: Jeannine Boga M.D.   On: 07-24-17 04:47   Reviewed 11/16 film    DISCUSSION:  Multi organ failure , on CRRT SHock , doubt sepsis  ASSESSMENT / PLAN:  PULMONARY A: Acute resp failure in setting of severe metabolic acidosis and circulatory shock  P:   Continue full ventilator support    CARDIOVASCULAR A:  Circulatory shock; doubt sepsis, likely vasodilatory History HTN P:  On max pressors Telemetry Follow CVp with goal 10-12 range   RENAL A:   Acute kidney injury -  likely secondary to tumor lysis Metabolic acidosis -worsening Hyperkalemia P:   Continue CRRT per nephrology -low K & bicarb bath Ct bicarb gtt    GASTROINTESTINAL A:   Transaminitis - likely secondary to TLS however; this is improved greatly since starting CRRT Oncology also raises concern that this could also be related to the lymphoma  in general. LFTs back up - prob shock liver now  P:   Ct trickle tube feeds PPI for SBP   HEMATOLOGIC/Oncologic A:   Diffuse large B-cell lymphoma. Recent Dx. Staging incomplete;  got 2 doses of Decadron, complicated by Tumor lysis syndrome has received 2 doses of Rasburicase;  Worsening thrombocytopenia Coagulopathy P:  Decadron 20 mg daily x 5ds Rituxan given 11/16  vitamin K given - check CBC & coags daily SCDs dc Holding subcu heparin   INFECTIOUS A:   Elevated pro-calcitonin, no obvious souce P:   ct  empiric antibiotics  ENDOCRINE A:   IDDM Hypothyroid P:   Sliding scale insulin Synthroid replacement  NEUROLOGIC A:   Acute metabolic vs toxic encephalopathy P:   Fent gtt -titrate to comfort  FAMILY  -  Updates: mother at bedside  - Inter-disciplinary family meet or Palliative Care meeting due by:  11/17, no CPR issued, ct other forms of life support, family understands guarded prognosis Doubt she will survive this , Asked family members to gather  The patient is critically ill with multiple organ systems failure and requires high complexity decision making for assessment and support, frequent evaluation and titration of therapies, application of advanced monitoring technologies and extensive interpretation of multiple databases. Critical Care Time devoted to patient care services described in this note independent of APP time is 30 minutes.    Leanna Sato Awanda Mink MD. [facility]. [facility] Pager [phone] If no response call [phone]   July 09, 2017

## 2017-07-09 NOTE — Progress Notes (Signed)
eLink Physician-Brief Progress Note Patient Name: Patricia Mcclain DOB: 1976-04-27 MRN: [id number]   Date of Service  July 09, 2017  HPI/Events of Note  ABG = 7.36/35.8/97.8  eICU Interventions  Continue present management.      Intervention Category Major Interventions: Other:  Lysle Dingwall 2017-07-09, 6:23 AM

## 2017-07-09 NOTE — Progress Notes (Signed)
Platelets up to 13K. Will give additional unit and follow in AM

## 2017-07-09 NOTE — Progress Notes (Signed)
eLink Physician-Brief Progress Note Patient Name: Patricia Mcclain DOB: 1976/08/01 MRN: [id number]   Date of Service  07/06/2017  HPI/Events of Note  Hypoglycemia - Blood glucose = 27.   eICU Interventions  Will order: 1. D50 2 amps IV now. 2. Increase D10W IV infusion to 80 mL.hour     Intervention Category Major Interventions: Other:  Lysle Dingwall Jul 06, 2017, 7:51 PM

## 2017-07-09 NOTE — Progress Notes (Signed)
Pt unresponsive, currently on mechanical ventilation. Unable to palpate carotid pulse.  DNR status confirmed.  Asystole on monitor and confirmed in two leads.  No heart sounds auscultated for full minute.  Pupils fixed and dilated.  Pt disconnected from ventilator - no breath sounds auscultated for full minute. All this confirmed by second registered nurse, Gildardo Griffes.  Macario Golds and Gildardo Griffes pronounced patient dead at: 11-27-99 on 07-18-2017.

## 2017-07-09 NOTE — Progress Notes (Signed)
Called to room for arterial line not picking up. Upon assessment, site looked clean and well dressed without leakage. Loose connection found with small amounts of air entrainment. Air pulled from line and flushed accordingly. Line was positional and bending at the proximal tip of the catheter. Undressed and redressed to prevent further occlusions. Good waveform noted, correlation noted with manual BP readings. RN aware. Patient tolerated well.

## 2017-07-09 DEATH — deceased

## 2017-07-13 ENCOUNTER — Other Ambulatory Visit: Payer: Self-pay | Admitting: Nurse Practitioner

## 2017-07-19 ENCOUNTER — Encounter (HOSPITAL_COMMUNITY): Payer: Self-pay

## 2017-07-20 LAB — CHROMOSOME ANALYSIS, BONE MARROW

## 2018-03-01 IMAGING — CT CT ANGIO CHEST
2 of 6 series · 16 of 36 positions shown · IV contrast (ISOVUE 370)
Comparison: CT chest and abdomen May 10, 2017; chest radiograph
May 13, 2017

ADDENDUM:
It must be noted that there is a degree of motion artifact
associated with this study. It is quite possible that the area of
concern for small incompletely obstructing pulmonary embolus in the
left lower lobe represents motion artifact as opposed to actual
pulmonary embolus. This finding is not seen convincingly on
reformatted images and may indeed be artifactual. It is not felt
that this equivocal focus of decreased attenuation is clinically
significant, particularly in light of patient's overall clinical
history.

These results will be called to the ordering clinician or
representative by the Radiologist Assistant, and communication
documented in the PACS or zVision Dashboard.
CLINICAL DATA: Chest pain and elevated D-dimer. History of liver
carcinoma
EXAM:
CT ANGIOGRAPHY CHEST WITH CONTRAST
TECHNIQUE: Multidetector CT imaging of the chest was performed using the
standard protocol during bolus administration of intravenous
contrast. Multiplanar CT image reconstructions and MIPs were
obtained to evaluate the vascular anatomy.
CONTRAST:  100 mL Isovue 370 nonionic

[Series 6: thins for pacs · axial · 0.68mm/px · z∈[-278,-54]mm · 15 of 250 slices shown]
[im 13/250  lung]
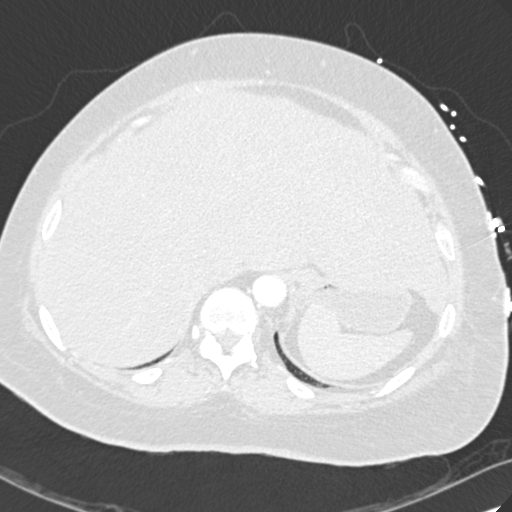
[im 25/250  mediastinal]
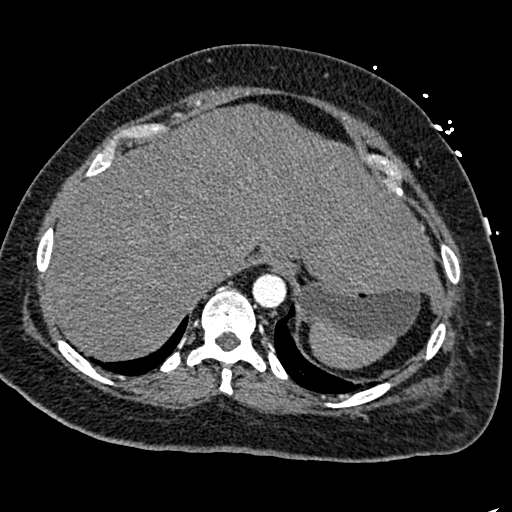
[im 50/250  lung]
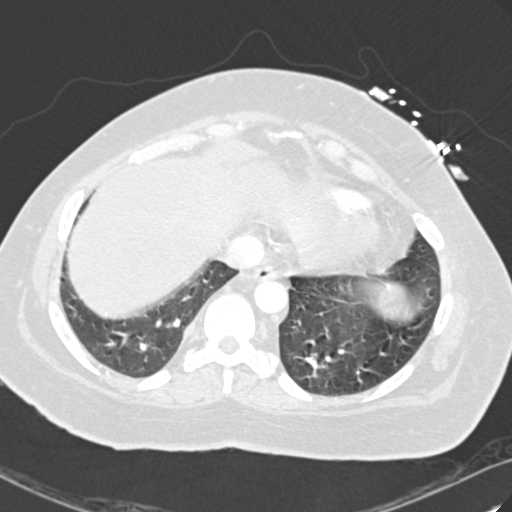
[im 63/250  mediastinal]
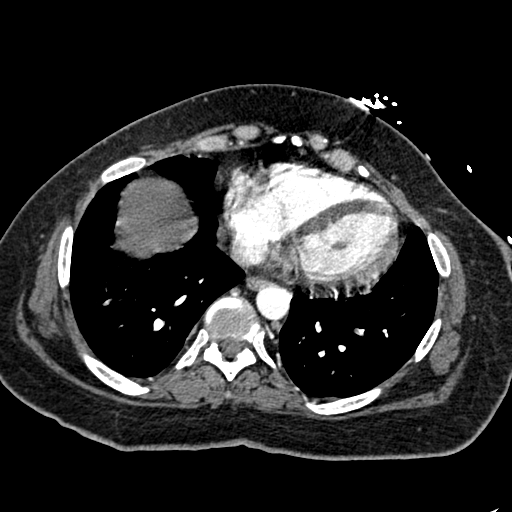
[im 75/250  lung]
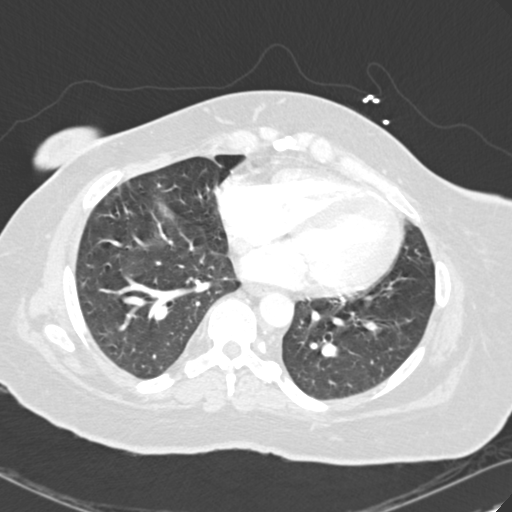
[im 88/250  mediastinal]
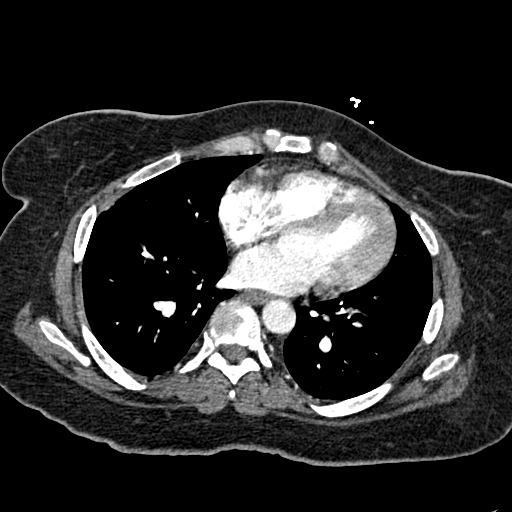
[im 113/250  lung]
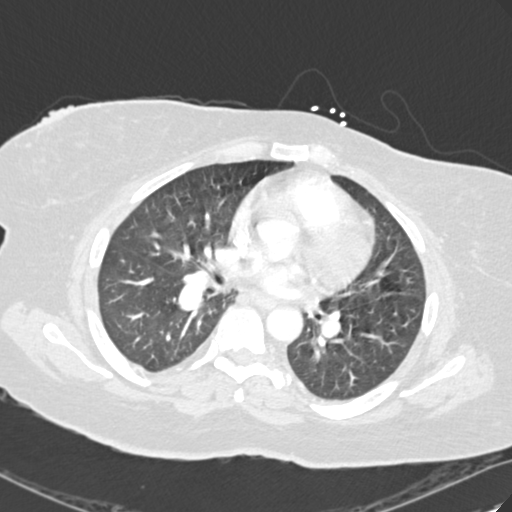
[im 125/250  mediastinal]
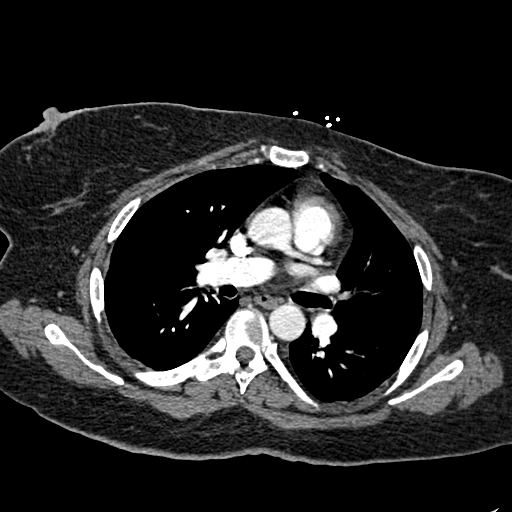
[im 137/250  lung]
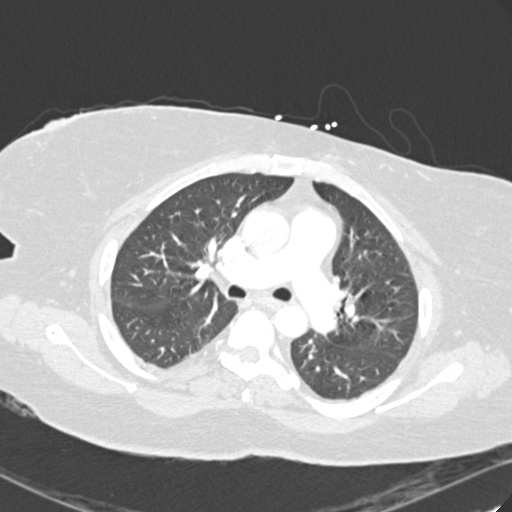
[im 162/250  mediastinal]
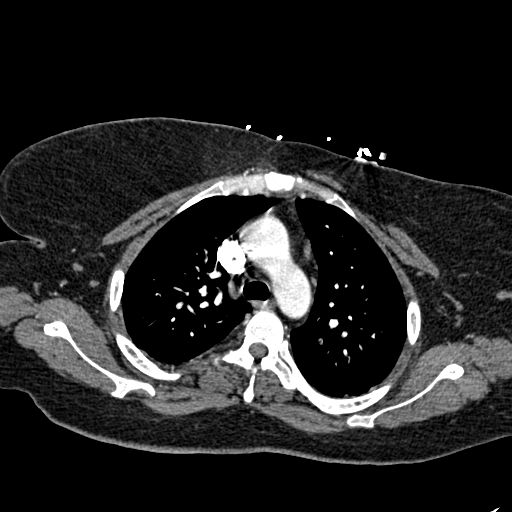
[im 175/250  lung]
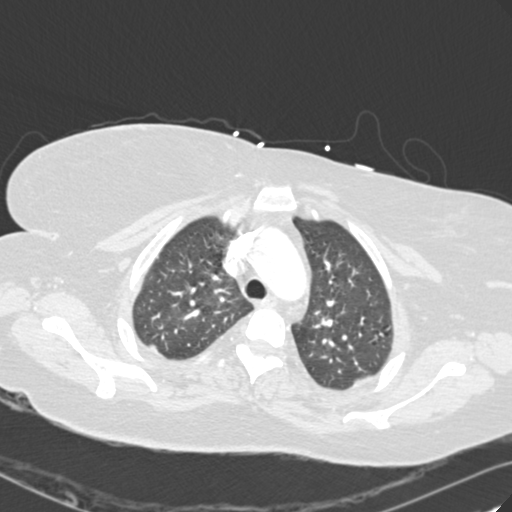
[im 187/250  mediastinal]
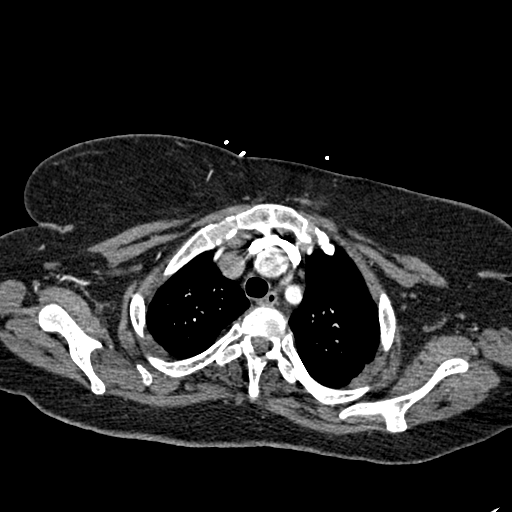
[im 200/250  lung]
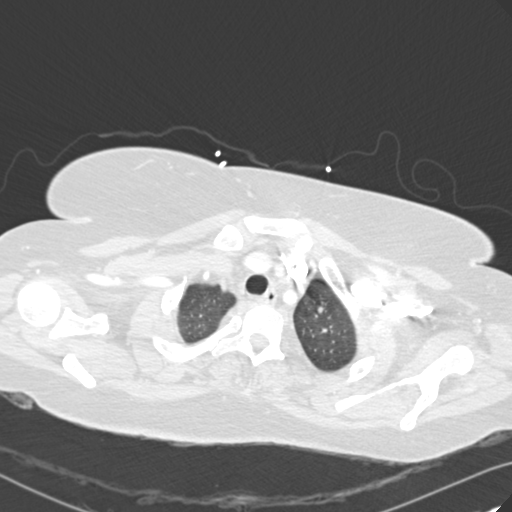
[im 225/250  mediastinal]
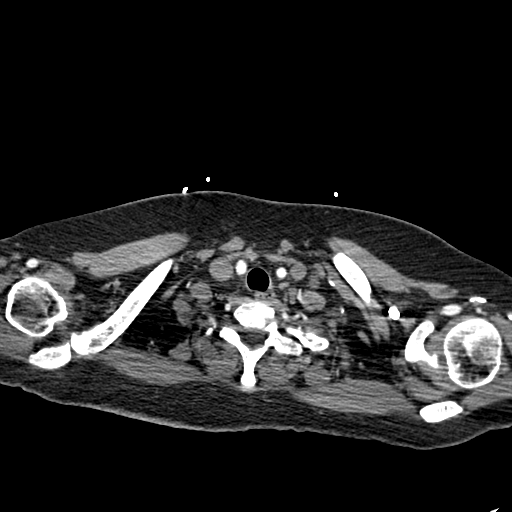
[im 237/250  lung]
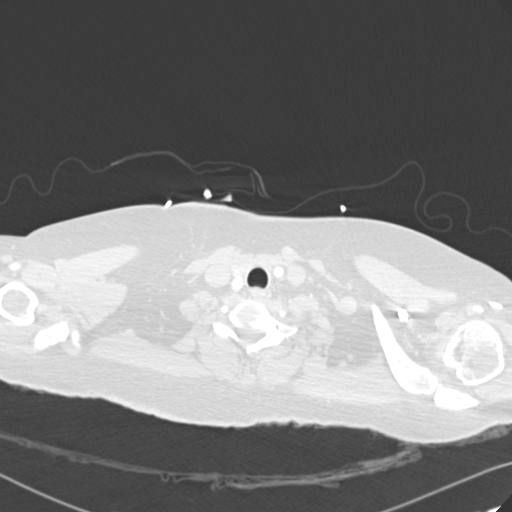

[Series 8: coronal mpr · coronal · 0.47mm/px · 1 of 129 slices shown]
[im 65/129  mediastinal]
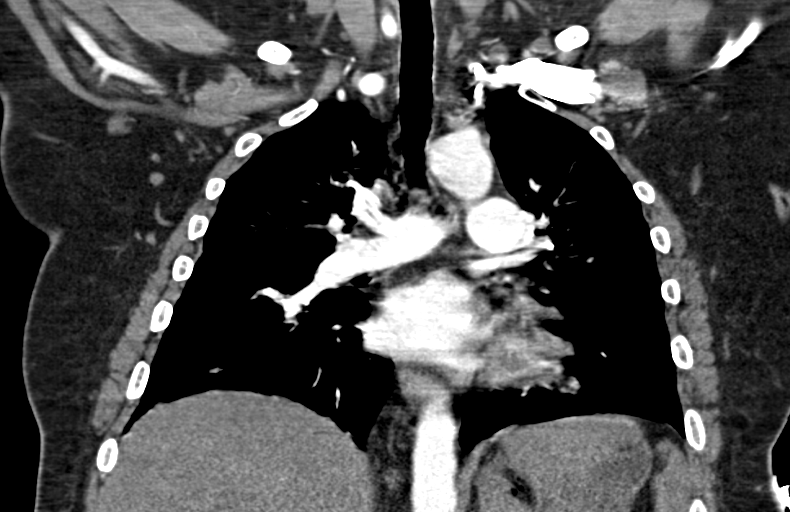

[16 of 36 positions shown; findings below may reference images not displayed]

FINDINGS: Cardiovascular: There are small incompletely obstructing pulmonary
emboli in the left lower lobe pulmonary artery. No more central
pulmonary emboli are evident. The right ventricle to left ventricle
diameter ratio is less than 0.9, not indicative of right heart
strain.

There is no thoracic aortic aneurysm or dissection. Visualized great
vessels appear unremarkable. Right and left common carotid arteries
arise as a common trunk, an anatomic variant. Pericardium is not
appreciably thickened. There is no pericardial effusion.

Mediastinum/Nodes: Visualized thyroid is diminutive without focal
lesion evident. There is no appreciable thoracic aortic adenopathy.
No esophageal lesions are appreciable.

Lungs/Pleura: There is a nodular opacity in the superior segment of
the right lower lobe measuring 6 x 6 mm, appreciable on axial slice
53 series 7. There is a nearby nodular opacity on this same slice in
the superior segment of the right lower lobe measuring 6 x 6 mm.
There is an ill-defined opacity abutting the pleura in the superior
segment of the left lower lobe seen on axial slice 45 series 7
measuring 6 x 6 mm.

There is no edema or consolidation. There is slight bibasilar
atelectasis. No pleural effusion or pleural thickening evident.

Upper Abdomen: The liver is enlarged with widespread mass lesions,
consistent with widespread metastatic disease to the liver. This
appearance is better seen on venous phase imaging from most recent
CT compared to arterial phase imaging on this current study.
Visualized upper abdominal structures appear unremarkable.

Musculoskeletal: There is upper thoracic levoscoliosis. There are no
lytic or destructive bone lesions. There is subtle sclerosis in the
T1 and T2 vertebral bodies, potentially representing sclerotic
metastatic lesions.

Review of the MIP images confirms the above findings.
IMPRESSION: 1. Small pulmonary emboli, incompletely obstructing, in the left
lower lobe pulmonary artery. No more central pulmonary emboli
identified. No pulmonary emboli elsewhere seen. No right heart
strain evident.

2. Nodular opacities in both lower lobes measuring 6 mm. Given
widespread liver metastases, small metastatic pulmonary nodular
lesions must be of concern. No consolidation. No larger parenchymal
lung mass lesions.

3.  No evident thoracic adenopathy.

4. Enlarged liver with widespread metastatic disease, better evident
on venous phase imaging from recent prior CT as opposed to this
arterial phase study.

5. Rather subtle sclerosis in the T1 and T2 vertebral bodies.
Concern for early sclerotic metastatic disease in these areas. No
lytic or destructive bony lesions.

Critical Value/emergent results were called by telephone at the time
of interpretation on 06/08/2017 at [DATE] to Dr. MEESHELI SOWDEN
, who verbally acknowledged these results.

## 2018-03-03 IMAGING — US US BIOPSY CORE LIVER
1 series · 8 of 8 positions shown · non-contrast
Comparison: none

CLINICAL DATA: Multiple liver lesions consistent metastatic
disease. No known primary.

[Series 1: us biopsy core liver · 0.26mm/px · 8 of 8 slices shown]
[im 1/8]
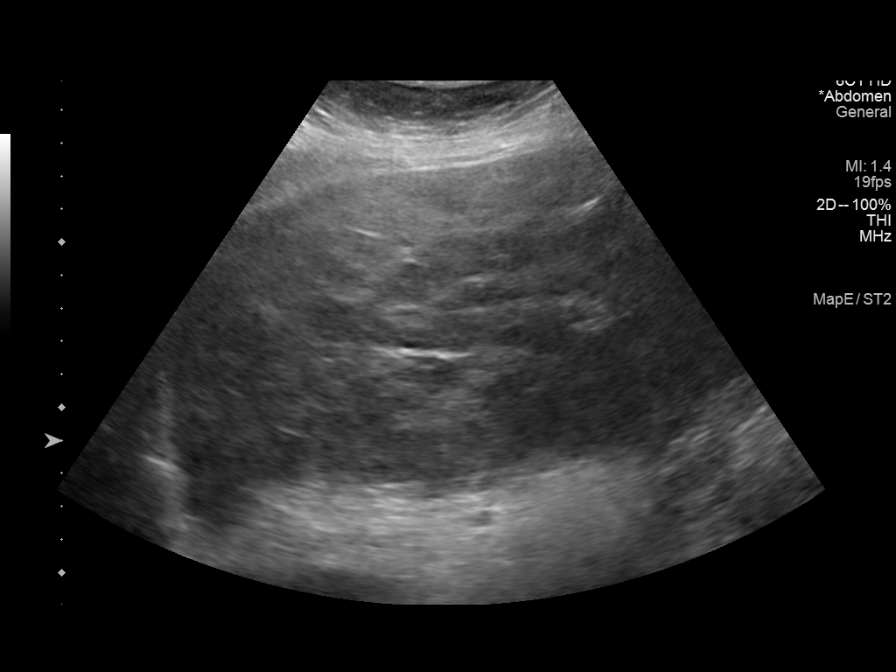
[im 2/8]
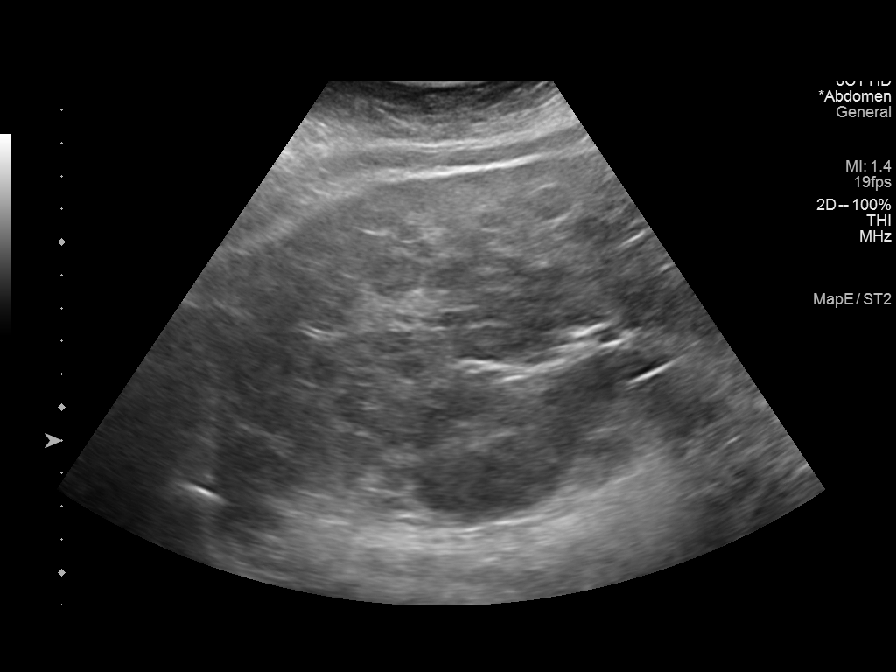
[im 3/8]
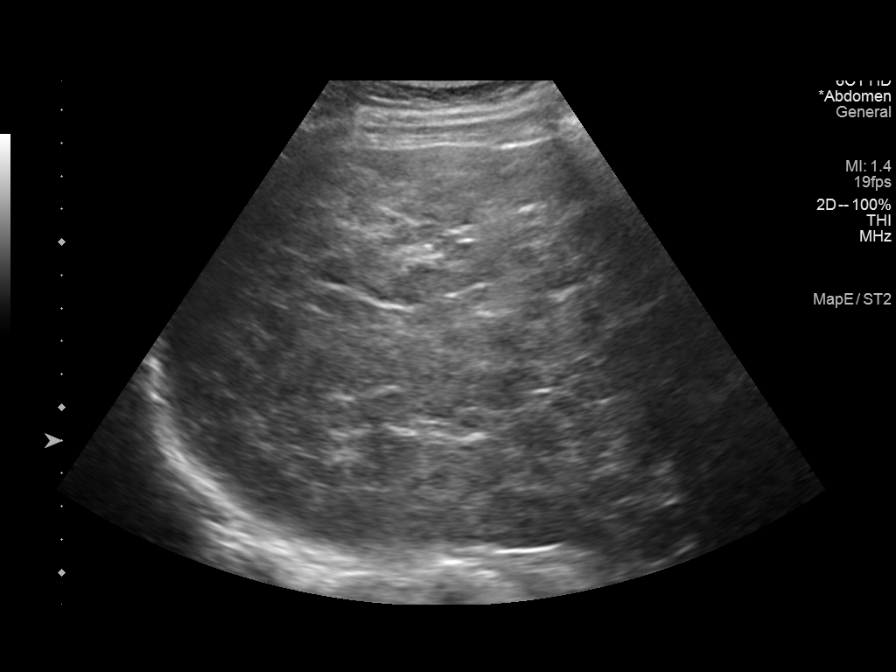
[im 4/8]
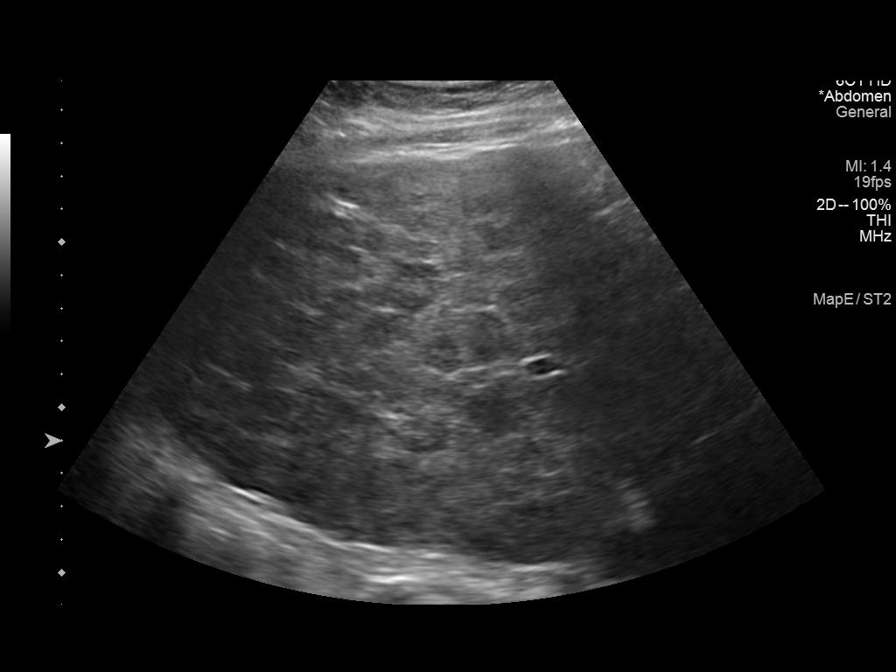
[im 5/8]
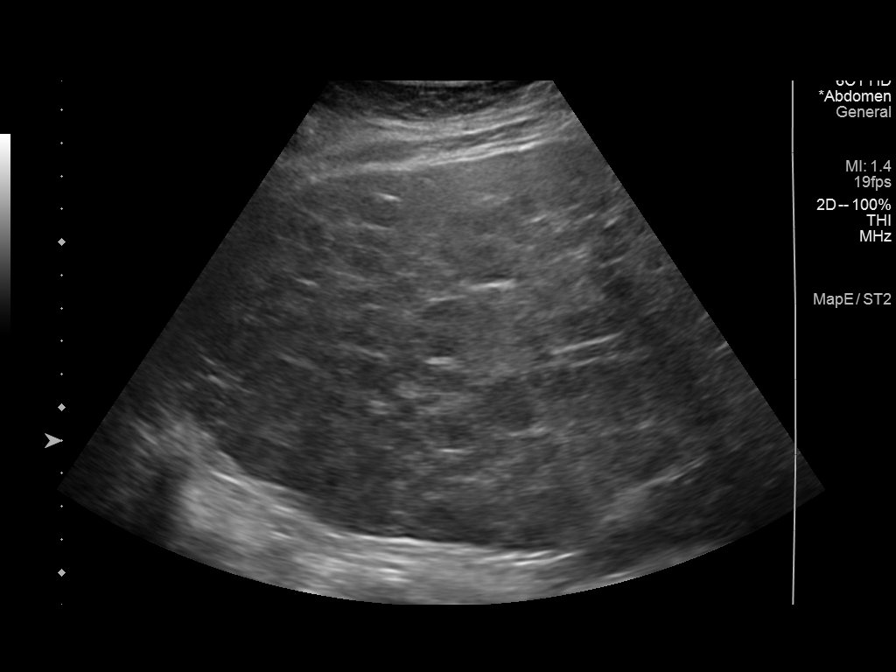
[im 6/8]
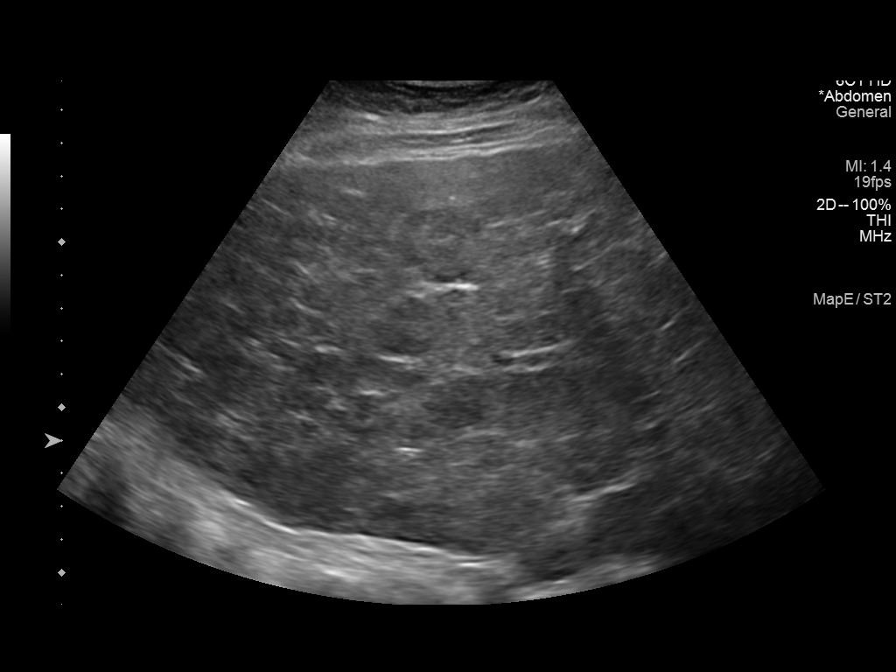
[im 7/8]
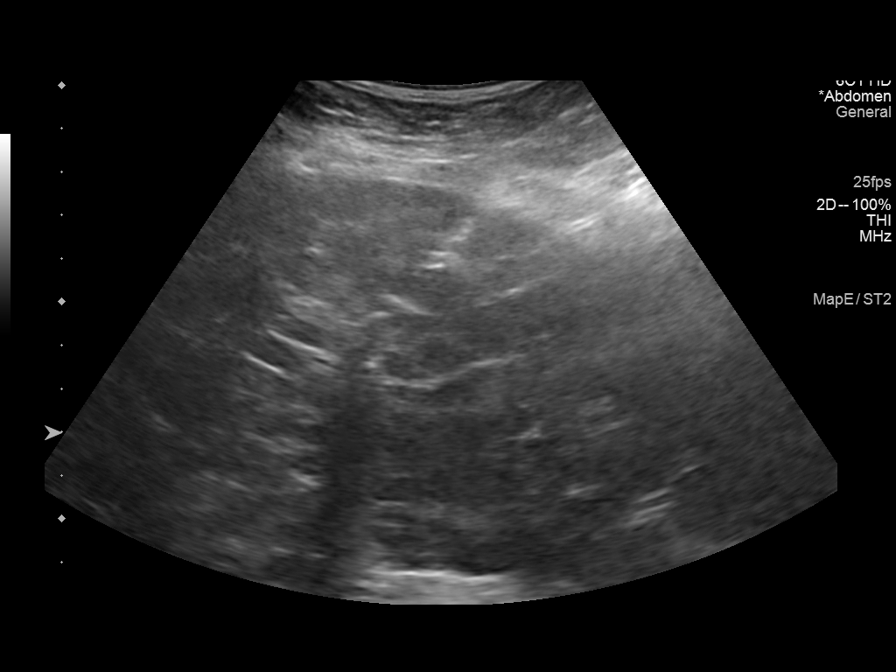
[im 8/8]
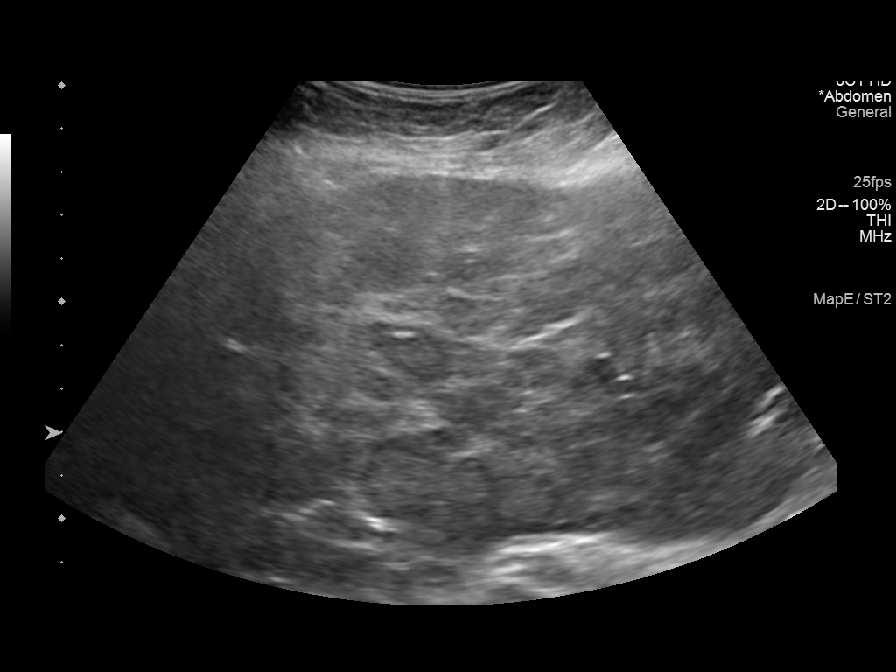

[8 of 8 positions shown; findings below may reference images not displayed]

EXAM:
ULTRASOUND GUIDED CORE BIOPSY OF LIVER LESION

MEDICATIONS:
Intravenous Fentanyl and Versed were administered as conscious
sedation during continuous monitoring of the patient's level of
consciousness and physiological / cardiorespiratory status by the
radiology RN, with a total moderate sedation time of 10 minutes.

PROCEDURE:
The procedure, risks, benefits, and alternatives were explained to
the patient. Questions regarding the procedure were encouraged and
answered. The patient understands and consents to the procedure.

Survey ultrasound of the liver was performed. Representative lesions
were localized and an appropriate skin entry site was determined and
marked.

The operative field was prepped with chlorhexidine in a sterile
fashion, and a sterile drape was applied covering the operative
field. A sterile gown and sterile gloves were used for the
procedure. Local anesthesia was provided with 1% Lidocaine.

Under real-time ultrasound guidance, a 17 gauge trocar needle was
advanced to the margin of the lesion. Once needle tip position was
confirmed, coaxial 18-gauge core biopsy samples were obtained,
submitted in formalin to surgical pathology. The guide needle was
removed. The patient tolerated the procedure well.

COMPLICATIONS:
None.
FINDINGS: Innumerable hypoechoic liver lesions were localized, corresponding
to findings on previous CT. Representative core biopsies obtained as
above
IMPRESSION: 1. Technically successful ultrasound-guided liver lesion core biopsy
